# Patient Record
Sex: Female | Born: 1995 | Race: Black or African American | Hispanic: No | Marital: Single | Smoking: Never smoker
Health system: Southern US, Community
[De-identification: ages and names within clinical notes are randomized; demographics above are authoritative.]

## PROBLEM LIST (undated history)

## (undated) DIAGNOSIS — T7840XA Allergy, unspecified, initial encounter: Secondary | ICD-10-CM

## (undated) DIAGNOSIS — K219 Gastro-esophageal reflux disease without esophagitis: Secondary | ICD-10-CM

## (undated) DIAGNOSIS — R002 Palpitations: Secondary | ICD-10-CM

## (undated) DIAGNOSIS — F419 Anxiety disorder, unspecified: Secondary | ICD-10-CM

## (undated) DIAGNOSIS — J020 Streptococcal pharyngitis: Secondary | ICD-10-CM

## (undated) DIAGNOSIS — D649 Anemia, unspecified: Secondary | ICD-10-CM

## (undated) HISTORY — DX: Allergy, unspecified, initial encounter: T78.40XA

## (undated) HISTORY — DX: Anemia, unspecified: D64.9

## (undated) HISTORY — PX: OTHER SURGICAL HISTORY: SHX169

---

## 2014-11-17 DIAGNOSIS — L508 Other urticaria: Secondary | ICD-10-CM | POA: Insufficient documentation

## 2015-04-12 ENCOUNTER — Emergency Department
Admit: 2015-04-12 | Disposition: A | Discharge status: Home / Self Care | Payer: Self-pay | Admitting: Emergency Medicine

## 2015-04-12 LAB — TROPONIN I: Troponin-I: <0.03 ng/mL

## 2015-04-12 LAB — BASIC METABOLIC PANEL
ANION GAP: 7 (ref 7–16)
BUN: 12 mg/dL
CO2: 27 mmol/L
CREATININE: 0.63 mg/dL
Calcium, Total: 9.4 mg/dL
Chloride: 103 mmol/L
EGFR (African American): >60
EGFR (Non-African Amer.): >60
Glucose: 107 mg/dL — ABNORMAL HIGH
Potassium: 3.5 mmol/L
Sodium: 137 mmol/L

## 2015-04-12 LAB — CBC
HCT: 28.3 % — ABNORMAL LOW (ref 35.0–47.0)
HGB: 8.4 g/dL — ABNORMAL LOW (ref 12.0–16.0)
MCH: 18.9 pg — ABNORMAL LOW (ref 26.0–34.0)
MCHC: 29.6 g/dL — AB (ref 32.0–36.0)
MCV: 64 fL — AB (ref 80–100)
Platelet: 248 10*3/uL (ref 150–440)
RBC: 4.44 10*6/uL (ref 3.80–5.20)
RDW: 20.8 % — ABNORMAL HIGH (ref 11.5–14.5)
WBC: 5.8 10*3/uL (ref 3.6–11.0)

## 2015-04-12 LAB — MAGNESIUM: MAGNESIUM: 1.9 mg/dL

## 2015-06-26 ENCOUNTER — Encounter: Payer: Self-pay | Admitting: *Deleted

## 2015-06-26 ENCOUNTER — Emergency Department: Payer: Federal, State, Local not specified - PPO

## 2015-06-26 ENCOUNTER — Observation Stay
Admission: EM | Admit: 2015-06-26 | Discharge: 2015-06-27 | Disposition: A | Discharge status: Home / Self Care | Payer: Federal, State, Local not specified - PPO | Attending: Specialist | Admitting: Specialist

## 2015-06-26 ENCOUNTER — Observation Stay
Admit: 2015-06-26 | Discharge: 2015-06-26 | Disposition: A | Discharge status: Home / Self Care | Payer: Federal, State, Local not specified - PPO | Attending: Specialist | Admitting: Specialist

## 2015-06-26 DIAGNOSIS — Z833 Family history of diabetes mellitus: Secondary | ICD-10-CM | POA: Insufficient documentation

## 2015-06-26 DIAGNOSIS — Z8249 Family history of ischemic heart disease and other diseases of the circulatory system: Secondary | ICD-10-CM | POA: Diagnosis not present

## 2015-06-26 DIAGNOSIS — R002 Palpitations: Secondary | ICD-10-CM | POA: Diagnosis present

## 2015-06-26 DIAGNOSIS — Z8489 Family history of other specified conditions: Secondary | ICD-10-CM | POA: Insufficient documentation

## 2015-06-26 DIAGNOSIS — D509 Iron deficiency anemia, unspecified: Secondary | ICD-10-CM | POA: Diagnosis not present

## 2015-06-26 DIAGNOSIS — R000 Tachycardia, unspecified: Principal | ICD-10-CM | POA: Insufficient documentation

## 2015-06-26 DIAGNOSIS — D72829 Elevated white blood cell count, unspecified: Secondary | ICD-10-CM | POA: Insufficient documentation

## 2015-06-26 DIAGNOSIS — J020 Streptococcal pharyngitis: Secondary | ICD-10-CM | POA: Insufficient documentation

## 2015-06-26 HISTORY — DX: Palpitations: R00.2

## 2015-06-26 HISTORY — DX: Streptococcal pharyngitis: J02.0

## 2015-06-26 HISTORY — DX: Gastro-esophageal reflux disease without esophagitis: K21.9

## 2015-06-26 LAB — COMPREHENSIVE METABOLIC PANEL
ALBUMIN: 4.2 g/dL (ref 3.5–5.0)
ALT: 12 U/L — ABNORMAL LOW (ref 14–54)
AST: 25 U/L (ref 15–41)
Alkaline Phosphatase: 55 U/L (ref 38–126)
Anion gap: 9 (ref 5–15)
BILIRUBIN TOTAL: 0.3 mg/dL (ref 0.3–1.2)
BUN: 10 mg/dL (ref 6–20)
CHLORIDE: 102 mmol/L (ref 101–111)
CO2: 23 mmol/L (ref 22–32)
Calcium: 9.3 mg/dL (ref 8.9–10.3)
Creatinine, Ser: 0.81 mg/dL (ref 0.44–1.00)
Glucose, Bld: 124 mg/dL — ABNORMAL HIGH (ref 65–99)
Potassium: 3.3 mmol/L — ABNORMAL LOW (ref 3.5–5.1)
Sodium: 134 mmol/L — ABNORMAL LOW (ref 135–145)
TOTAL PROTEIN: 8.4 g/dL — AB (ref 6.5–8.1)

## 2015-06-26 LAB — RETICULOCYTES
RBC.: 4.63 MIL/uL (ref 3.80–5.20)
RETIC COUNT ABSOLUTE: 41.7 10*3/uL (ref 19.0–183.0)
Retic Ct Pct: 0.9 % (ref 0.4–3.1)

## 2015-06-26 LAB — CBC WITH DIFFERENTIAL/PLATELET
Basophils Absolute: 0.1 10*3/uL (ref 0–0.1)
EOS ABS: 0.2 10*3/uL (ref 0–0.7)
HCT: 28.3 % — ABNORMAL LOW (ref 35.0–47.0)
HEMOGLOBIN: 8.6 g/dL — AB (ref 12.0–16.0)
LYMPHS ABS: 1 10*3/uL (ref 1.0–3.6)
Lymphocytes Relative: 5 %
MCH: 18.7 pg — ABNORMAL LOW (ref 26.0–34.0)
MCHC: 30.3 g/dL — ABNORMAL LOW (ref 32.0–36.0)
MCV: 61.8 fL — AB (ref 80.0–100.0)
MONO ABS: 1.4 10*3/uL — AB (ref 0.2–0.9)
Neutro Abs: 15.7 10*3/uL — ABNORMAL HIGH (ref 1.4–6.5)
Platelets: 160 10*3/uL (ref 150–440)
RBC: 4.59 MIL/uL (ref 3.80–5.20)
RDW: 20.7 % — AB (ref 11.5–14.5)
WBC: 18.4 10*3/uL — ABNORMAL HIGH (ref 3.6–11.0)

## 2015-06-26 LAB — FOLATE: FOLATE: 17.2 ng/mL (ref >5.9)

## 2015-06-26 LAB — IRON AND TIBC
IRON: 23 ug/dL — AB (ref 28–170)
SATURATION RATIOS: 5 % — AB (ref 10.4–31.8)
TIBC: 442 ug/dL (ref 250–450)
UIBC: 419 ug/dL

## 2015-06-26 LAB — FERRITIN: FERRITIN: 11 ng/mL (ref 11–307)

## 2015-06-26 LAB — VITAMIN B12: Vitamin B-12: 674 pg/mL (ref 180–914)

## 2015-06-26 LAB — HCG, QUANTITATIVE, PREGNANCY

## 2015-06-26 LAB — T4, FREE: FREE T4: 0.67 ng/dL (ref 0.61–1.12)

## 2015-06-26 LAB — TSH: TSH: 2.887 u[IU]/mL (ref 0.350–4.500)

## 2015-06-26 LAB — TROPONIN I: Troponin I: <0.03 ng/mL (ref <0.031)

## 2015-06-26 IMAGING — CR DG CHEST 1V PORT
1 series · 1 of 1 positions shown · non-contrast
Comparison: None.

CLINICAL DATA: Chest palpitations since 6 a.m..

EXAM:
PORTABLE CHEST - 1 VIEW

[ap]
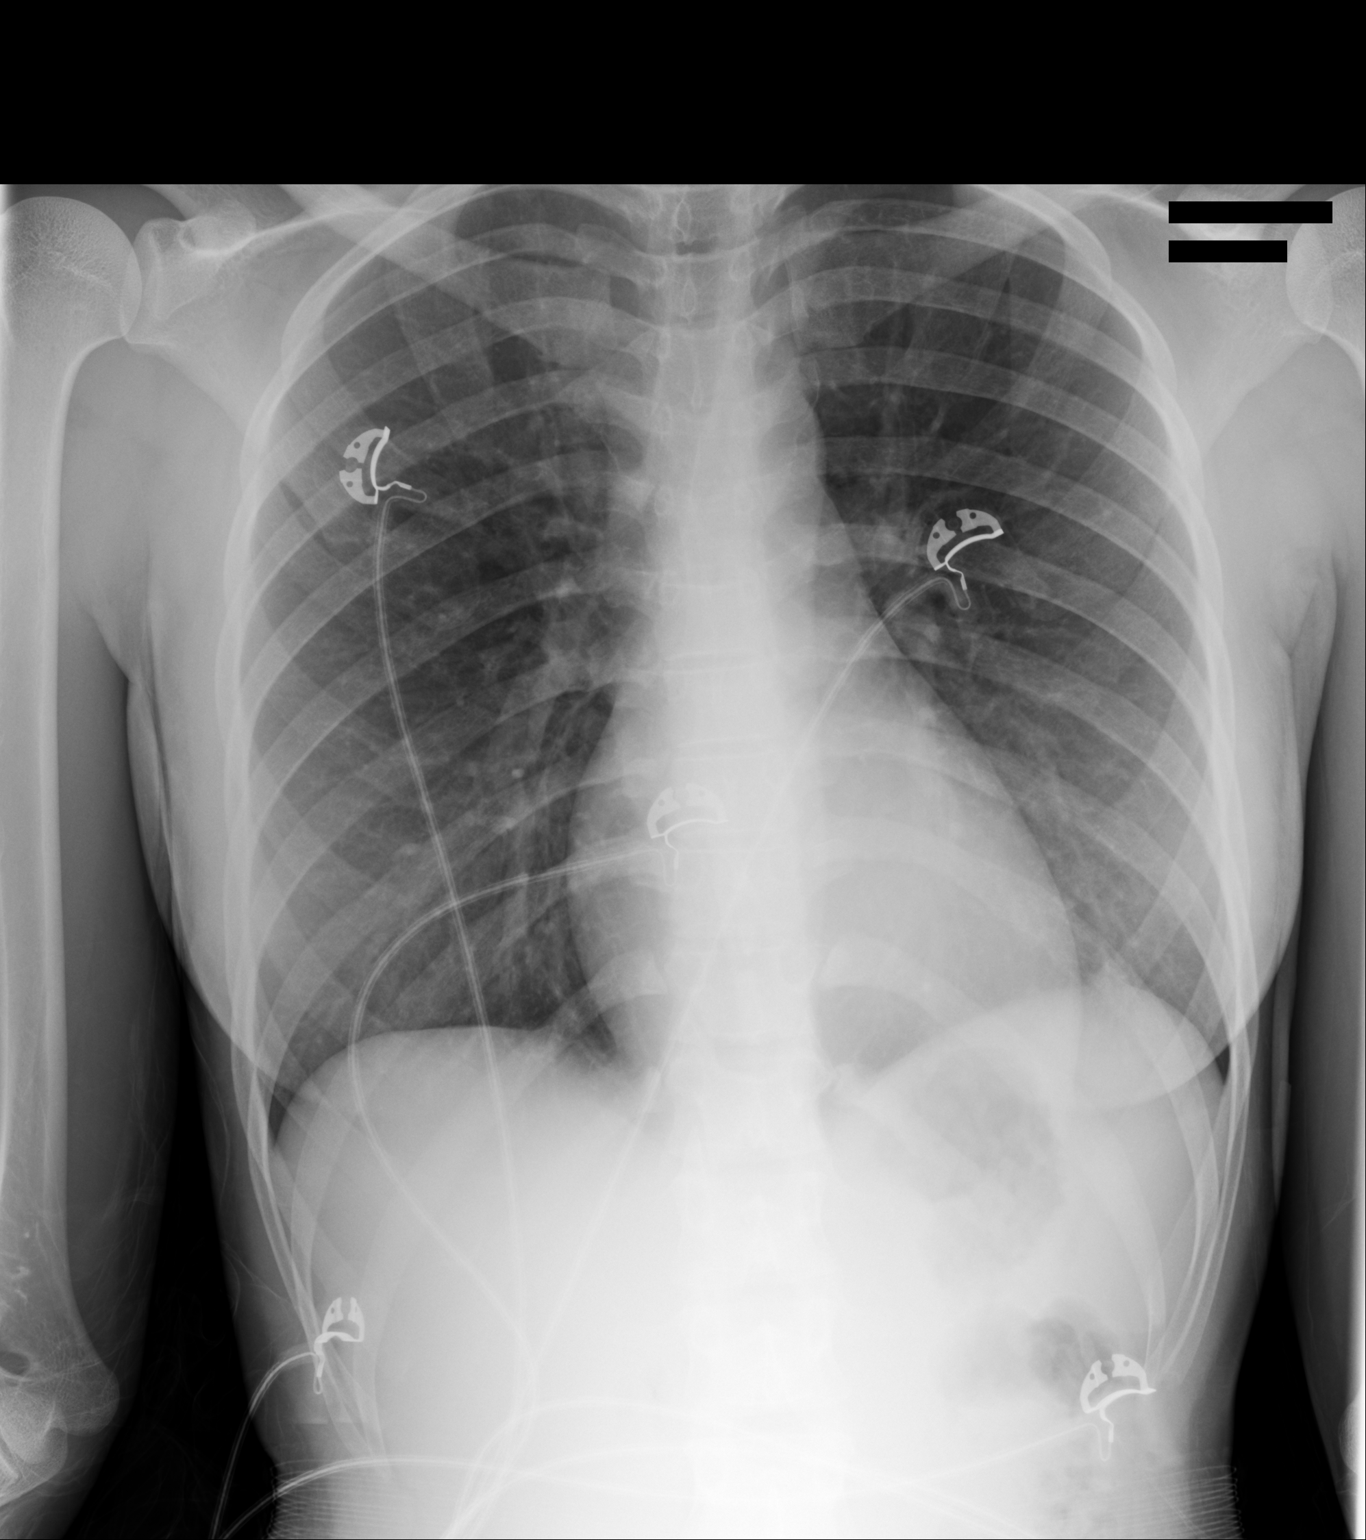

[1 of 1 positions shown; findings below may reference images not displayed]

FINDINGS: Minimal left base opacity, atelectasis versus early infiltrate.
Right lung is clear. Heart is normal size. No effusions or acute
bony abnormality.
IMPRESSION: Minimal left base atelectasis or early infiltrate.

## 2015-06-26 MED ORDER — SODIUM CHLORIDE 0.9 % IV SOLN
INTRAVENOUS | Status: DC
  Administered 2015-06-26 – 2015-06-27 (×3): via INTRAVENOUS

## 2015-06-26 MED ORDER — ACETAMINOPHEN 325 MG PO TABS: 650.0000 mg | ORAL_TABLET | Freq: Four times a day (QID) | ORAL | Status: DC | PRN

## 2015-06-26 MED ORDER — SODIUM CHLORIDE 0.9 % IV SOLN
1000.0000 mL | Freq: Once | INTRAVENOUS | Status: AC
  Administered 2015-06-26: 1000 mL via INTRAVENOUS

## 2015-06-26 MED ORDER — ACETAMINOPHEN 160 MG/5ML PO SOLN
650.0000 mg | Freq: Four times a day (QID) | ORAL | Status: DC | PRN
  Administered 2015-06-26 – 2015-06-27 (×3): 650 mg via ORAL
  Filled 2015-06-26 (×3): qty 20.3

## 2015-06-26 MED ORDER — ONDANSETRON HCL 4 MG/2ML IJ SOLN
4.0000 mg | Freq: Four times a day (QID) | INTRAMUSCULAR | Status: DC | PRN
  Administered 2015-06-26: 4 mg via INTRAVENOUS
  Filled 2015-06-26 (×2): qty 2

## 2015-06-26 MED ORDER — ACETAMINOPHEN 650 MG RE SUPP: 650.0000 mg | Freq: Four times a day (QID) | RECTAL | Status: DC | PRN

## 2015-06-26 MED ORDER — ONDANSETRON HCL 4 MG PO TABS: 4.0000 mg | ORAL_TABLET | Freq: Four times a day (QID) | ORAL | Status: DC | PRN

## 2015-06-26 MED ORDER — SODIUM CHLORIDE 0.9 % IV BOLUS (SEPSIS)
500.0000 mL | Freq: Once | INTRAVENOUS | Status: AC
  Administered 2015-06-26: 500 mL via INTRAVENOUS

## 2015-06-26 MED ORDER — FERROUS SULFATE 220 (44 FE) MG/5ML PO ELIX
220.0000 mg | ORAL_SOLUTION | Freq: Two times a day (BID) | ORAL | Status: DC
  Administered 2015-06-26 – 2015-06-27 (×2): 220 mg via ORAL
  Filled 2015-06-26 (×4): qty 5

## 2015-06-26 MED ORDER — LEVOFLOXACIN IN D5W 750 MG/150ML IV SOLN
750.0000 mg | INTRAVENOUS | Status: DC
  Administered 2015-06-26: 750 mg via INTRAVENOUS
  Filled 2015-06-26 (×3): qty 150

## 2015-06-26 MED ORDER — PANTOPRAZOLE SODIUM 40 MG PO TBEC
40.0000 mg | DELAYED_RELEASE_TABLET | Freq: Every day | ORAL | Status: DC
  Administered 2015-06-26 – 2015-06-27 (×2): 40 mg via ORAL
  Filled 2015-06-26 (×2): qty 1

## 2015-06-26 MED ORDER — POTASSIUM CHLORIDE 20 MEQ PO PACK
40.0000 meq | PACK | Freq: Once | ORAL | Status: AC
  Administered 2015-06-26: 40 meq via ORAL
  Filled 2015-06-26: qty 2

## 2015-06-26 NOTE — Progress Notes (Signed)
Patient complaining of leg cramps. Potassium this morning was 3.3. Per Dr. Cherlynn KaiserSainani, go ahead and give 40 mEq of Potassium powder.  Adella NissenBailey, Brunella Wileman G

## 2015-06-26 NOTE — Progress Notes (Signed)
Patient admitted to unit. Oriented to room, call bell, and staff. Bed in lowest position. Fall safety plan reviewed. Full assessment to Epic. Will continue to monitor. Skin assessment with Vilinda BlanksBrandi Mansfield RN.  Dr. Cherlynn KaiserSainani notified that patient wants liquid tylenol instead of pills. - order to change to liquid with same dose and frequency as previous order.  Jennifer Byrd, Jennifer Byrd

## 2015-06-26 NOTE — Progress Notes (Signed)
Alert and oriented. Family at bedside. Complaints of headache and nausea improved by medications. Resting quietly.  Adella NissenBailey, Morry Veiga G

## 2015-06-26 NOTE — ED Provider Notes (Addendum)
[facility] Emergency Department Provider Note  ____________________________________________  Time seen: 7 AM  I have reviewed the triage vital signs and the nursing notes.   HISTORY  Chief Complaint Palpitations    HPI Jennifer Byrd is a 19 y.o. female who presents with heart racing. She reports yesterday afternoon she realized her heart was racing but it improved after work at approximately 1 AM and she felt well. She then woke up this morning and felt like her heart was racing so came to the emergency department. She has no pain. She recently finished a course of cefdinir antibiotics for sore throat. She denies increased caffeine use. She has no shortness of breath. No recent travel. No history of DVT or PE. She had similar symptoms 1-1/2 months ago but they improved by decreasing caffeine intake. She has no pleurisy. She reports the symptoms have been improving since she has been in the emergency department     Past Medical History  Diagnosis Date  . Acid reflux   . Strep throat   . Heart palpitations     Patient Active Problem List   Diagnosis Date Noted  . Palpitations 06/26/2015    History reviewed. No pertinent past surgical history.  Current Outpatient Rx  Name  Route  Sig  Dispense  Refill  . cefdinir (OMNICEF) 300 MG capsule   Oral   Take 300 mg by mouth 2 (two) times daily. Take for 10 days.         [name]. omeprazole (PRILOSEC) 40 MG capsule   Oral   Take 40 mg by mouth daily. Take 30 to 60 minutes prior to evening meal.           Allergies Amoxicillin and Amoxicillin-pot clavulanate  Family History  Problem Relation Age of Onset  . Diabetes Mother   . Hypothyroidism Mother   . Diabetes Father   . Hypertension Father     Social History History  Substance Use Topics  . Smoking status: Never Smoker   . Smokeless tobacco: Not on file  . Alcohol Use: No    Review of Systems  Constitutional: Negative for  fever. Eyes: Negative for visual changes. ENT: Negative for sore throat Cardiovascular: Negative for chest pain. Positive for palpitations Respiratory: Negative for shortness of breath. Gastrointestinal: Negative for abdominal pain, vomiting and diarrhea. Genitourinary: Negative for dysuria. Musculoskeletal: Negative for back pain. Skin: Negative for rash. Neurological: Negative for headaches or focal weakness Psychiatric: No anxiety  10-point ROS otherwise negative.  ____________________________________________   PHYSICAL EXAM:  VITAL SIGNS: ED Triage Vitals  Enc Vitals Group     BP 06/26/15 0626 126/69 mmHg     Pulse Rate 06/26/15 0626 156     Resp 06/26/15 0626 20     Temp 06/26/15 0626 98.2 F (36.8 C)     Temp Source 06/26/15 0626 Oral     SpO2 06/26/15 0626 100 %     Weight 06/26/15 0626 122 lb (55.339 kg)     Height 06/26/15 0626 4\' 8"  (1.422 m)     Head Cir --      Peak Flow --      Pain Score --      Pain Loc --      Pain Edu? --      Excl. in GC? --      Constitutional: Alert and oriented. Well appearing and in no distress. Eyes: Conjunctivae are normal.  ENT   Head: Normocephalic and atraumatic.  Mouth/Throat: Mucous membranes are moist. Cardiovascular: Tachycardia, regular rhythm. Normal and symmetric distal pulses are present in all extremities. No murmurs, rubs, or gallops. Respiratory: Normal respiratory effort without tachypnea nor retractions. Breath sounds are clear and equal bilaterally.  Gastrointestinal: Soft and non-tender in all quadrants. No distention. There is no CVA tenderness. Genitourinary: deferred Musculoskeletal: Nontender with normal range of motion in all extremities. No lower extremity tenderness nor edema. Neurologic:  Normal speech and language. No gross focal neurologic deficits are appreciated. Skin:  Skin is warm, dry and intact. No rash noted. Psychiatric: Mood and affect are normal. Patient exhibits appropriate  insight and judgment.  ____________________________________________    LABS (pertinent positives/negatives)  Labs Reviewed  CBC WITH DIFFERENTIAL/PLATELET - Abnormal; Notable for the following:    WBC 18.4 (*)    Hemoglobin 8.6 (*)    HCT 28.3 (*)    MCV 61.8 (*)    MCH 18.7 (*)    MCHC 30.3 (*)    RDW 20.7 (*)    Neutro Abs 15.7 (*)    Monocytes Absolute 1.4 (*)    All other components within normal limits  COMPREHENSIVE METABOLIC PANEL - Abnormal; Notable for the following:    Sodium 134 (*)    Potassium 3.3 (*)    Glucose, Bld 124 (*)    Total Protein 8.4 (*)    ALT 12 (*)    All other components within normal limits  TSH  T4, FREE  HCG, QUANTITATIVE, PREGNANCY  TROPONIN I    ____________________________________________   EKG  ED ECG REPORT I, Jene Every, the attending physician, personally viewed and interpreted this ECG.   Date: 06/26/2015  EKG Time: 6:39 AM  Rate: 148  Rhythm: sinus tachycardia  Axis: Normal  Intervals:none  ST&T Change: Nonspecific changes   ____________________________________________    RADIOLOGY I have personally reviewed any xrays that were ordered on this patient: Chest x-ray with area of atelectasis versus infiltrate  ____________________________________________   PROCEDURES  Procedure(s) performed: none  Critical Care performed: none  ____________________________________________   INITIAL IMPRESSION / ASSESSMENT AND PLAN / ED COURSE  Pertinent labs & imaging results that were available during my care of the patient were reviewed by me and considered in my medical decision making (see chart for details).  Patient's symptoms improving in emergency department with IV fluids and rest. Unclear cause of tachycardia we will check thyroid levels, CMP, CBC.  Even after fluids patient persistently tachycardic especially with movement I will consult internal medicine for consideration for  admission. ____________________________________________   FINAL CLINICAL IMPRESSION(S) / ED DIAGNOSES  Final diagnoses:  Heart palpitations     Jene Every, MD 06/26/15 1610  Jene Every, MD 06/26/15 [phone]

## 2015-06-26 NOTE — ED Notes (Signed)
Pt has episode of HR jumping up to 145, MD was in with patient

## 2015-06-26 NOTE — H&P (Signed)
[facility] - [place] [facility]   PATIENT NAME: Jennifer Byrd    MR#:  [id number]  DATE OF BIRTH:  [birth year]  DATE OF ADMISSION:  06/26/2015  PRIMARY CARE PHYSICIAN: No primary care provider on file.   REQUESTING/REFERRING PHYSICIAN: Dr. Jene Every  CHIEF COMPLAINT:   Chief Complaint  Patient presents with  . Palpitations    HISTORY OF PRESENT ILLNESS:  Jennifer Byrd  is a 19 y.o. female with a known history of recent acute pharyngitis, reflux, chronic iron deficient anemia who presented to the hospital due to palpitations, dizziness. Patient was in her usual state of health when yesterday evening she started to feel her heart was racing and when she started to get up she felt a bit dizzy and lightheaded. Patient was recently diagnosed with acute pharyngitis and started on oral Augmentin but could not tolerate it due to a rash and therefore switched over to oral Cefdinir.  She was seen by ENT by Dr. Geanie Logan had a laryngoscopy which showed some mild reflux recently. She now presents with the above complaints. Patient was hydrated with IV fluids and her heart rate did come down from the 150s to low 100's but when she attempted to cannulate her heart rate went back up to the 150s again. EKG is consistent with sinus tachycardia. Hospitalist services were contacted for further treatment and evaluation.  PAST MEDICAL HISTORY:   Past Medical History  Diagnosis Date  . Acid reflux   . Strep throat   . Heart palpitations     PAST SURGICAL HISTORY:  History reviewed. No pertinent past surgical history.  SOCIAL HISTORY:   History  Substance Use Topics  . Smoking status: Never Smoker   . Smokeless tobacco: Not on file  . Alcohol Use: No    FAMILY HISTORY:   Family History  Problem Relation Age of Onset  . Diabetes Mother   . Hypothyroidism Mother   . Diabetes Father   . Hypertension Father     DRUG ALLERGIES:   Allergies  Allergen  Reactions  . Amoxicillin Hives, Rash and Other (See Comments)    Patient states it makes her neck break out.  [name] Amoxicillin-Pot Clavulanate Rash    REVIEW OF SYSTEMS:   Review of Systems  Constitutional: Positive for chills. Negative for fever and weight loss.  HENT: Negative for congestion, nosebleeds and tinnitus.   Eyes: Negative for blurred vision, double vision and redness.  Respiratory: Negative for cough, hemoptysis and shortness of breath.   Cardiovascular: Positive for palpitations. Negative for chest pain, orthopnea, leg swelling and PND.  Gastrointestinal: Negative for nausea, vomiting, abdominal pain, diarrhea and melena.  Genitourinary: Negative for dysuria, urgency and hematuria.  Musculoskeletal: Negative for joint pain and falls.  Neurological: Positive for dizziness. Negative for tingling, sensory change, focal weakness, seizures, weakness and headaches.  Endo/Heme/Allergies: Negative for polydipsia. Does not bruise/bleed easily.  Psychiatric/Behavioral: Negative for depression and memory loss. The patient is not nervous/anxious.     MEDICATIONS AT HOME:   Prior to Admission medications   Medication Sig Start Date End Date Taking? Authorizing Provider  cefdinir (OMNICEF) 300 MG capsule Take 300 mg by mouth 2 (two) times daily. Take for 10 days.   Yes Historical Provider, MD  omeprazole (PRILOSEC) 40 MG capsule Take 40 mg by mouth daily. Take 30 to 60 minutes prior to evening meal.   Yes Historical Provider, MD      VITAL SIGNS:  Blood pressure 137/87, pulse  112, temperature 98.2 F (36.8 C), temperature source Oral, resp. rate 18, height 5\' 6"  (1.676 m), weight 55.339 kg (122 lb), last menstrual period 06/03/2015, SpO2 100 %.  PHYSICAL EXAMINATION:  Physical Exam  GENERAL:  19 y.o.-year-old patient lying in the bed with no acute distress.  EYES: Pupils equal, round, reactive to light and accommodation. No scleral icterus. Extraocular muscles intact.  HEENT:  Head atraumatic, normocephalic. Oropharynx and nasopharynx clear. No oropharyngeal erythema, moist oral mucosa  NECK:  Supple, no jugular venous distention. No thyroid enlargement, no tenderness.  LUNGS: Normal breath sounds bilaterally, no wheezing, rales, rhonchi. No use of accessory muscles of respiration.  CARDIOVASCULAR: S1, S2 RRR. Tachycardic. No murmurs, rubs, gallops, clicks.  ABDOMEN: Soft, nontender, nondistended. Bowel sounds present. No organomegaly or mass.  EXTREMITIES: No pedal edema, cyanosis, or clubbing. + 2 pedal & radial pulses b/l.   NEUROLOGIC: Cranial nerves II through XII are intact. No focal Motor or sensory deficits appreciated b/l PSYCHIATRIC: The patient is alert and oriented x 3. Good affect.  SKIN: No obvious rash, lesion, or ulcer.   LABORATORY PANEL:   CBC  Recent Labs Lab 06/26/15 0642  WBC 18.4*  HGB 8.6*  HCT 28.3*  PLT 160   ------------------------------------------------------------------------------------------------------------------  Chemistries   Recent Labs Lab 06/26/15 0642  NA 134*  K 3.3*  CL 102  CO2 23  GLUCOSE 124*  BUN 10  CREATININE 0.81  CALCIUM 9.3  AST 25  ALT 12*  ALKPHOS 55  BILITOT 0.3   ------------------------------------------------------------------------------------------------------------------  Cardiac Enzymes  Recent Labs Lab 06/26/15 0642  TROPONINI <0.03   ------------------------------------------------------------------------------------------------------------------  RADIOLOGY:  Dg Chest Portable 1 View  06/26/2015   CLINICAL DATA:  Chest palpitations since 6 a.m.  EXAM: PORTABLE CHEST - 1 VIEW  COMPARISON:  None.  FINDINGS: Minimal left base opacity, atelectasis versus early infiltrate. Right lung is clear. Heart is normal size. No effusions or acute bony abnormality.  IMPRESSION: Minimal left base atelectasis or early infiltrate.   Electronically Signed   By: Charlett Nose M.D.   On:  06/26/2015 07:21     IMPRESSION AND PLAN:   19 year old female with past medical history of GERD, chronic iron deficient anemia, recent history of acute pharyngitis who presents to the hospital with tachycardia and palpitations also dizziness.  #1 SIRS - patient presents with a heart rate greater than 120 and also leukocytosis. -The exact source is presently unclear. Patient recently had acute pharyngitis and exam presently she has no oral exudates or any lymphadenopathy. Chest x-ray is negative for any acute pneumonia and urinalysis is negative. -We'll empirically start her on IV Levaquin. Follow fever curve and hemodynamics. I will also aggressively hydrate her with IV fluids.  #2 sinus tachycardia-possibly related to the underlying infection. Questionable underlying primary Cardiologic problem which is unlikely though. -We'll observe on telemetry, follow heart rate. Get a two-dimensional echocardiogram. -If with IV fluid hydration overnight patient's heart rate does not improve we'll consider getting cardiology evaluation a.m.  #3 anemia-microcytic. This is likely secondary to her iron deficient anemia. Patient is noncompliant with iron supplements. -We will check iron, ferritin, B12, folate, reticulocyte count. -We'll restart her iron supplements. Patient likely needs to be referred to hematology oncology as an outpatient.  -Patient's hemoglobin is unchanged from April to now.  #4 GERD-continue Protonix.   All the records are reviewed and case discussed with ED provider. Management plans discussed with the patient, family and they are in agreement.  CODE STATUS - FULL  TOTAL TIME TAKING CARE OF THIS PATIENT - 45 minutes.    [place] SirenSAINANI,Zi Newbury J M.D on 06/26/2015 at 9:07 AM  Between 7am to 6pm - Pager - [phone]  After 6pm go to [email or web address] - password [email or web address] [facility]  [facility]  Office  [phone]  CC: Primary care physician; No primary care provider on  file.

## 2015-06-26 NOTE — ED Notes (Signed)
Pt presents w/ tachycardia. Pt states recent hypertension. Pt denies chest pain and shortness of breath. Pt states the palpitations have been occurring intermittently since yesterday. Pt drove self to ED.

## 2015-06-26 NOTE — Progress Notes (Signed)
*  PRELIMINARY RESULTS* Echocardiogram 2D Echocardiogram has been performed.  Jennifer Byrd 06/26/2015, 11:01 AM

## 2015-06-27 LAB — CBC
HCT: 25.6 % — ABNORMAL LOW (ref 35.0–47.0)
Hemoglobin: 7.8 g/dL — ABNORMAL LOW (ref 12.0–16.0)
MCH: 19 pg — ABNORMAL LOW (ref 26.0–34.0)
MCHC: 30.6 g/dL — ABNORMAL LOW (ref 32.0–36.0)
MCV: 62.1 fL — ABNORMAL LOW (ref 80.0–100.0)
Platelets: 166 10*3/uL (ref 150–440)
RBC: 4.12 MIL/uL (ref 3.80–5.20)
RDW: 20.9 % — AB (ref 11.5–14.5)
WBC: 16 10*3/uL — AB (ref 3.6–11.0)

## 2015-06-27 LAB — URINALYSIS COMPLETE WITH MICROSCOPIC (ARMC ONLY)
BACTERIA UA: NONE SEEN
Bilirubin Urine: NEGATIVE
Glucose, UA: NEGATIVE mg/dL
HGB URINE DIPSTICK: NEGATIVE
Ketones, ur: NEGATIVE mg/dL
Leukocytes, UA: NEGATIVE
NITRITE: NEGATIVE
Protein, ur: 100 mg/dL — AB
SPECIFIC GRAVITY, URINE: 1.012 (ref 1.005–1.030)
pH: 6 (ref 5.0–8.0)

## 2015-06-27 LAB — BASIC METABOLIC PANEL
ANION GAP: 6 (ref 5–15)
BUN: <5 mg/dL — ABNORMAL LOW (ref 6–20)
CALCIUM: 8.6 mg/dL — AB (ref 8.9–10.3)
CO2: 22 mmol/L (ref 22–32)
CREATININE: 0.63 mg/dL (ref 0.44–1.00)
Chloride: 106 mmol/L (ref 101–111)
GFR calc Af Amer: >60 mL/min (ref >60)
GLUCOSE: 107 mg/dL — AB (ref 65–99)
Potassium: 3.7 mmol/L (ref 3.5–5.1)
SODIUM: 134 mmol/L — AB (ref 135–145)

## 2015-06-27 MED ORDER — FERROUS SULFATE 220 (44 FE) MG/5ML PO ELIX: 220.0000 mg | ORAL_SOLUTION | Freq: Two times a day (BID) | ORAL | Status: DC

## 2015-06-27 MED ORDER — FERUMOXYTOL INJECTION 510 MG/17 ML
510.0000 mg | Freq: Once | INTRAVENOUS | Status: AC
  Administered 2015-06-27: 510 mg via INTRAVENOUS
  Filled 2015-06-27: qty 17

## 2015-06-27 NOTE — Discharge Instructions (Signed)
°  DIET:  °Regular diet ° °DISCHARGE CONDITION:  °Good ° °ACTIVITY:  °Activity as tolerated ° °OXYGEN:  °Home Oxygen: No. °  °Oxygen Delivery: room air ° °DISCHARGE LOCATION:  °home  ° °If you experience worsening of your admission symptoms, develop shortness of breath, life threatening emergency, suicidal or homicidal thoughts you must seek medical attention immediately by calling 911 or calling your MD immediately  if symptoms less severe. ° °You Must read complete instructions/literature along with all the possible adverse reactions/side effects for all the Medicines you take and that have been prescribed to you. Take any new Medicines after you have completely understood and accpet all the possible adverse reactions/side effects.  ° °Please note ° °You were cared for by a hospitalist during your hospital stay. If you have any questions about your discharge medications or the care you received while you were in the hospital after you are discharged, you can call the unit and asked to speak with the hospitalist on call if the hospitalist that took care of you is not available. Once you are discharged, your primary care physician will handle any further medical issues. Please note that NO REFILLS for any discharge medications will be authorized once you are discharged, as it is imperative that you return to your primary care physician (or establish a relationship with a primary care physician if you do not have one) for your aftercare needs so that they can reassess your need for medications and monitor your lab values. ° ° °

## 2015-06-27 NOTE — Discharge Summary (Signed)
[facility] [place] at [facility]   PATIENT NAME: Jennifer Byrd    MR#:  [id number]  DATE OF BIRTH:  07/18/1996  DATE OF ADMISSION:  06/26/2015 ADMITTING PHYSICIAN: Houston Siren, MD  DATE OF DISCHARGE: 06/27/2015  PRIMARY CARE PHYSICIAN: No primary care provider on file.    ADMISSION DIAGNOSIS:  Heart palpitations [R00.2]  DISCHARGE DIAGNOSIS:  Active Problems:   Palpitations   SECONDARY DIAGNOSIS:   Past Medical History  Diagnosis Date  . Acid reflux   . Strep throat   . Heart palpitations     HOSPITAL COURSE:   19 year old female with past medical history of GERD, recent pharyngitis who presented to the hospital with palpitations and noted to be severely tachycardic and noted to have a leukocytosis.  #1 SIRS - patient presented with a heart rate greater than 120 and also leukocytosis. -The exact source is still unclear. Patient had a chest x-ray negative for any acute pneumonia. She had a recent bout of pharyngitis but on exam she had no tonsillar exudates or any lymphadenopathy. -She is clinically afebrile and hemodynamically stable and her heart rate has improved on just IV fluid hydration and therefore she is being discharged home. -She was given IV Levaquin in the hospital but she can resume her Cefdinir that she was prescribed by the ENT physician upon discharge.  #2 sinus tachycardia-this was likely related to possible sirs. Patient has been hydrated with IV fluids and her heart rate has improved. -Patient did have a two-dimensional echocardiogram checked which was normal with no acute wall motion abnormalities and no evidence of structural heart disease.  #3 anemia-microcytic. This is chronic for the patient and likely iron deficient in nature. -Patient was given 1 dose of Feraheme in the hospital. She has been advised to resume her iron supplements upon discharge. -Patient is going to follow-up at the cancer center as an outpatient for  her chronic iron deficient anemia. -The likely cause of the iron deficiency is probably dietary in nature as there was no evidence of acute bleeding.  #4 GERD-patient will continue omeprazole.  DISCHARGE CONDITIONS:   Stable  CONSULTS OBTAINED:     DRUG ALLERGIES:   Allergies  Allergen Reactions  . Amoxicillin Hives, Rash and Other (See Comments)    Patient states it makes her neck break out.  [name] Amoxicillin-Pot Clavulanate Rash    DISCHARGE MEDICATIONS:   Current Discharge Medication List    START taking these medications   Details  ferrous sulfate 220 (44 FE) MG/5ML solution Take 5 mLs (220 mg total) by mouth 2 (two) times daily with a meal. Qty: 150 mL, Refills: 3      CONTINUE these medications which have NOT CHANGED   Details  cefdinir (OMNICEF) 300 MG capsule Take 300 mg by mouth 2 (two) times daily. Take for 10 days.    omeprazole (PRILOSEC) 40 MG capsule Take 40 mg by mouth daily. Take 30 to 60 minutes prior to evening meal.         DISCHARGE INSTRUCTIONS:   DIET:  Regular diet  DISCHARGE CONDITION:  Stable  ACTIVITY:  Activity as tolerated  OXYGEN:  Home Oxygen: No.   Oxygen Delivery: room air  DISCHARGE LOCATION:  home   If you experience worsening of your admission symptoms, develop shortness of breath, life threatening emergency, suicidal or homicidal thoughts you must seek medical attention immediately by calling 911 or calling your MD immediately  if symptoms less severe.  You Must  read complete instructions/literature along with all the possible adverse reactions/side effects for all the Medicines you take and that have been prescribed to you. Take any new Medicines after you have completely understood and accpet all the possible adverse reactions/side effects.   Please note  You were cared for by a hospitalist during your hospital stay. If you have any questions about your discharge medications or the care you received while you were in  the hospital after you are discharged, you can call the unit and asked to speak with the hospitalist on call if the hospitalist that took care of you is not available. Once you are discharged, your primary care physician will handle any further medical issues. Please note that NO REFILLS for any discharge medications will be authorized once you are discharged, as it is imperative that you return to your primary care physician (or establish a relationship with a primary care physician if you do not have one) for your aftercare needs so that they can reassess your need for medications and monitor your lab values.     Today   Patient feels better, has no palpitations.  VITAL SIGNS:  Blood pressure 115/64, pulse 102, temperature 99 F (37.2 C), temperature source Oral, resp. rate 20, height 5\' 6"  (1.676 m), weight 55.339 kg (122 lb), last menstrual period 06/03/2015, SpO2 100 %.  I/O:   Intake/Output Summary (Last 24 hours) at 06/27/15 1201 Last data filed at 06/27/15 0953  Gross per 24 hour  Intake 1449.17 ml  Output      0 ml  Net 1449.17 ml    PHYSICAL EXAMINATION:  GENERAL:  19 y.o.-year-old patient lying in the bed with no acute distress.  EYES: Pupils equal, round, reactive to light and accommodation. No scleral icterus. Extraocular muscles intact.  HEENT: Head atraumatic, normocephalic. Oropharynx and nasopharynx clear. No oral exudates  NECK:  Supple, no jugular venous distention. No thyroid enlargement, no tenderness.  LUNGS: Normal breath sounds bilaterally, no wheezing, rales,rhonchi. No use of accessory muscles of respiration.  CARDIOVASCULAR: S1, S2 normal. No murmurs, rubs, or gallops.  ABDOMEN: Soft, non-tender, non-distended. Bowel sounds present. No organomegaly or mass.  EXTREMITIES: No pedal edema, cyanosis, or clubbing.  NEUROLOGIC: Cranial nerves II through XII are intact. No focal motor or sensory defecits b/l.  PSYCHIATRIC: The patient is alert and oriented x 3.  Good affect.  SKIN: No obvious rash, lesion, or ulcer.   DATA REVIEW:   CBC  Recent Labs Lab 06/27/15 0511  WBC 16.0*  HGB 7.8*  HCT 25.6*  PLT 166    Chemistries   Recent Labs Lab 06/26/15 0642 06/27/15 0511  NA 134* 134*  K 3.3* 3.7  CL 102 106  CO2 23 22  GLUCOSE 124* 107*  BUN 10 <5*  CREATININE 0.81 0.63  CALCIUM 9.3 8.6*  AST 25  --   ALT 12*  --   ALKPHOS 55  --   BILITOT 0.3  --     Cardiac Enzymes  Recent Labs Lab 06/26/15 0642  TROPONINI <0.03    Microbiology Results  No results found for this or any previous visit.  RADIOLOGY:  Dg Chest Portable 1 View  06/26/2015   CLINICAL DATA:  Chest palpitations since 6 a.m.  EXAM: PORTABLE CHEST - 1 VIEW  COMPARISON:  None.  FINDINGS: Minimal left base opacity, atelectasis versus early infiltrate. Right lung is clear. Heart is normal size. No effusions or acute bony abnormality.  IMPRESSION: Minimal left base atelectasis or  early infiltrate.   Electronically Signed   By: Charlett NoseKevin  Dover M.D.   On: 06/26/2015 07:21      Management plans discussed with the patient, family and they are in agreement.  CODE STATUS:     Code Status Orders        Start     Ordered   06/26/15 1004  Full code   Continuous     06/26/15 1003      TOTAL TIME TAKING CARE OF THIS PATIENT: 40 minutes.    Houston SirenSAINANI,Claudell Rhody J M.D on 06/27/2015 at 12:01 PM  Between 7am to 6pm - Pager - [phone]  After 6pm go to [email or web address] - password [email or web address] [facility]  [facility]  Office  [phone]  CC: Primary care physician; No primary care provider on file.

## 2015-06-27 NOTE — Progress Notes (Signed)
[facility]    Jennifer Byrd was admitted to the Hospital on 06/26/2015 and Discharged  06/27/2015 and should be excused from work  for 4 days starting 06/27/2015, may return to work/school without any restrictions.  Call [name] MD, [facility]  [phone] with questions.  Houston SirenSAINANI,VIVEK J M.D on 06/27/2015,at 11:26 AM

## 2015-07-01 ENCOUNTER — Other Ambulatory Visit: Payer: Self-pay

## 2015-07-01 ENCOUNTER — Emergency Department
Admission: EM | Admit: 2015-07-01 | Discharge: 2015-07-01 | Disposition: A | Discharge status: Home / Self Care | Payer: Federal, State, Local not specified - PPO | Attending: Emergency Medicine | Admitting: Emergency Medicine

## 2015-07-01 DIAGNOSIS — Z79899 Other long term (current) drug therapy: Secondary | ICD-10-CM | POA: Diagnosis not present

## 2015-07-01 DIAGNOSIS — R002 Palpitations: Secondary | ICD-10-CM

## 2015-07-01 DIAGNOSIS — Z880 Allergy status to penicillin: Secondary | ICD-10-CM | POA: Diagnosis not present

## 2015-07-01 LAB — COMPREHENSIVE METABOLIC PANEL
ALBUMIN: 4.2 g/dL (ref 3.5–5.0)
ALT: 17 U/L (ref 14–54)
AST: 20 U/L (ref 15–41)
Alkaline Phosphatase: 69 U/L (ref 38–126)
Anion gap: 8 (ref 5–15)
BUN: 9 mg/dL (ref 6–20)
CHLORIDE: 103 mmol/L (ref 101–111)
CO2: 28 mmol/L (ref 22–32)
CREATININE: 0.66 mg/dL (ref 0.44–1.00)
Calcium: 9.4 mg/dL (ref 8.9–10.3)
GFR calc non Af Amer: >60 mL/min (ref >60)
Glucose, Bld: 98 mg/dL (ref 65–99)
Potassium: 3.6 mmol/L (ref 3.5–5.1)
Sodium: 139 mmol/L (ref 135–145)
TOTAL PROTEIN: 7.9 g/dL (ref 6.5–8.1)
Total Bilirubin: 0.2 mg/dL — ABNORMAL LOW (ref 0.3–1.2)

## 2015-07-01 LAB — CBC WITH DIFFERENTIAL/PLATELET
Basophils Absolute: 0.1 10*3/uL (ref 0–0.1)
Basophils Relative: 1 %
Eosinophils Absolute: 0.3 10*3/uL (ref 0–0.7)
Eosinophils Relative: 5 %
HEMATOCRIT: 29.9 % — AB (ref 35.0–47.0)
Hemoglobin: 9.2 g/dL — ABNORMAL LOW (ref 12.0–16.0)
Lymphocytes Relative: 20 %
Lymphs Abs: 1.5 10*3/uL (ref 1.0–3.6)
MCH: 19.4 pg — AB (ref 26.0–34.0)
MCHC: 30.8 g/dL — AB (ref 32.0–36.0)
MCV: 62.9 fL — ABNORMAL LOW (ref 80.0–100.0)
MONO ABS: 0.8 10*3/uL (ref 0.2–0.9)
Monocytes Relative: 10 %
Neutro Abs: 4.8 10*3/uL (ref 1.4–6.5)
Neutrophils Relative %: 64 %
Platelets: 253 10*3/uL (ref 150–440)
RBC: 4.76 MIL/uL (ref 3.80–5.20)
RDW: 22 % — ABNORMAL HIGH (ref 11.5–14.5)
WBC: 7.5 10*3/uL (ref 3.6–11.0)

## 2015-07-01 LAB — URINALYSIS COMPLETE WITH MICROSCOPIC (ARMC ONLY)
BILIRUBIN URINE: NEGATIVE
Bacteria, UA: NONE SEEN
GLUCOSE, UA: NEGATIVE mg/dL
Ketones, ur: NEGATIVE mg/dL
Leukocytes, UA: NEGATIVE
Nitrite: NEGATIVE
Protein, ur: 100 mg/dL — AB
Specific Gravity, Urine: 1.017 (ref 1.005–1.030)
pH: 7 (ref 5.0–8.0)

## 2015-07-01 NOTE — ED Notes (Addendum)
Pt currently on commode. Pt made aware to notify RN when completed. Pt requested the assistance of a female Charity fundraiserN.

## 2015-07-01 NOTE — ED Provider Notes (Signed)
[facility] Emergency Department Provider Note  ____________________________________________  Time seen: Approximately 6:43 PM  I have reviewed the triage vital signs and the nursing notes.   HISTORY  Chief Complaint Palpitations    HPI Story Jennifer Byrd is a 19 y.o. female patient recently discharged from the hospital after being treated for possible sepsis. Patient reports she is having some diarrhea. She she has 3-4 episodes of diarrhea during the day. This has started while she was in the hospital and discontinued. Patient also feels a little bit lightheaded when she stands up sometimes. She has continued episodes of palpitations. Especially in the morning when she wakes up. She reports when I examine her that she's having some palpitations at that time although not as fast her heart is usual. At the time patient was having regular rate and rhythm with a good pulse. And was not tachycardic. Patient reports she did not finish the Ceftin near antibiotics as she was supposed to because her throat felt normal. She has not been running a fever.   Past Medical History  Diagnosis Date  . Acid reflux   . Strep throat   . Heart palpitations     Patient Active Problem List   Diagnosis Date Noted  . Palpitations 06/26/2015    No past surgical history on file.  Current Outpatient Rx  Name  Route  Sig  Dispense  Refill  . cefdinir (OMNICEF) 300 MG capsule   Oral   Take 300 mg by mouth 2 (two) times daily. Take for 10 days.         . ferrous sulfate 220 (44 FE) MG/5ML solution   Oral   Take 5 mLs (220 mg total) by mouth 2 (two) times daily with a meal.   150 mL   3   . omeprazole (PRILOSEC) 40 MG capsule   Oral   Take 40 mg by mouth daily. Take 30 to 60 minutes prior to evening meal.           Allergies Amoxicillin and Amoxicillin-pot clavulanate  Family History  Problem Relation Age of Onset  . Diabetes Mother   . Hypothyroidism Mother   .  Diabetes Father   . Hypertension Father     Social History History  Substance Use Topics  . Smoking status: Never Smoker   . Smokeless tobacco: Not on file  . Alcohol Use: No    Review of Systems Constitutional: No fever/chills Eyes: No visual changes. ENT: No sore throat. Cardiovascular: Denies chest pain. Respiratory: Denies shortness of breath. Gastrointestinal: No abdominal pain.  No nausea, no vomiting.  Patient had constipation before she had the diarrhea but that resolved Genitourinary: Negative for dysuria. Musculoskeletal: Negative for back pain. Skin: Negative for rash. Neurological: Negative for headaches, focal weakness or numbness.  10-point ROS otherwise negative.  ____________________________________________   PHYSICAL EXAM:  VITAL SIGNS: ED Triage Vitals  Enc Vitals Group     BP 07/01/15 1437 119/83 mmHg     Pulse Rate 07/01/15 1437 100     Resp 07/01/15 1437 16     Temp 07/01/15 1437 98.6 F (37 C)     Temp Source 07/01/15 1437 Oral     SpO2 07/01/15 1437 100 %     Weight 07/01/15 1437 125 lb (56.7 kg)     Height 07/01/15 1437  (1.676 m)     Head Cir --      Peak Flow --      Pain Score  07/01/15 1759 0     Pain Loc --      Pain Edu? --      Excl. in GC? --     Constitutional: Alert and oriented. Well appearing and in no acute distress. Eyes: Conjunctivae are normal. PERRL. EOMI. Head: Atraumatic. Nose: No congestion/rhinnorhea. Mouth/Throat: Mucous membranes are moist.  Oropharynx non-erythematous. Neck: No stridor.  Cardiovascular: Normal rate, regular rhythm. Grossly normal heart sounds.  Good peripheral circulation. Respiratory: Normal respiratory effort.  No retractions. Lungs CTAB. Gastrointestinal: Soft and nontender. No distention. No abdominal bruits. No CVA tenderness. Musculoskeletal: No lower extremity tenderness nor edema.  No joint effusions. Neurologic:  Normal speech and language. No gross focal neurologic deficits are  appreciated. Speech is normal. No gait instability. Skin:  Skin is warm, dry and intact. No rash noted. Psychiatric: Mood and affect are normal. Speech and behavior are normal.  ____________________________________________   LABS (all labs ordered are listed, but only abnormal results are displayed)  Labs Reviewed  CBC WITH DIFFERENTIAL/PLATELET - Abnormal; Notable for the following:    Hemoglobin 9.2 (*)    HCT 29.9 (*)    MCV 62.9 (*)    MCH 19.4 (*)    MCHC 30.8 (*)    RDW 22.0 (*)    All other components within normal limits  COMPREHENSIVE METABOLIC PANEL - Abnormal; Notable for the following:    Total Bilirubin 0.2 (*)    All other components within normal limits  URINALYSIS COMPLETEWITH MICROSCOPIC ([facility] ONLY) - Abnormal; Notable for the following:    Color, Urine YELLOW (*)    APPearance CLEAR (*)    Hgb urine dipstick 2+ (*)    Protein, ur 100 (*)    Squamous Epithelial / LPF 0-5 (*)    All other components within normal limits  C DIFFICILE QUICK SCAN W PCR REFLEX ([facility] ONLY)  STOOL CULTURE  POC URINE PREG, ED   ____________________________________________  EKG EKG read and interpreted by me shows normal sinus rhythm at a rate of 99 there are no acute changes. She does have RSR prime pattern in V1 but the QRS duration is normal. ____________________________________________  RADIOLOGY   ____________________________________________   PROCEDURES Nurse did orthostatic vital signs. He reports that the patient is not orthostatic.____________________________________________   INITIAL IMPRESSION / ASSESSMENT AND PLAN / ED COURSE  Pertinent labs & imaging results that were available during my care of the patient were reviewed by me and considered in my medical decision making (see chart for details).  Stool for C. difficile and culture was ordered. Patient has not been able to make a stool specimen. We will let her go home with the culture bottles and try to  bring back a stool specimen. Discussed patient with Dr. Ihor AustinPfaff cardiology he will follow her up for her palpitations. She is to call on Monday morning to get worked into the schedule. ____________________________________________   FINAL CLINICAL IMPRESSION(S) / ED DIAGNOSES  Final diagnoses:  None      Arnaldo NatalPaul F Malinda, MD 07/01/15 [phone]

## 2015-07-01 NOTE — ED Notes (Signed)
Pt educated on proper collection technique and submission of stool sample to lab.

## 2015-07-01 NOTE — ED Notes (Signed)
Pt states that she was seen on Sunday and admitted, pt was placed on iron supplements due to low iron, pt states that she came to the er that day with palpitation, pt states that she cont to have palpitations but they aren't quite as bad. Pt also states that she cont to have diarrhea.

## 2015-07-06 ENCOUNTER — Inpatient Hospital Stay: Payer: Federal, State, Local not specified - PPO | Attending: Oncology | Admitting: Oncology

## 2015-07-06 ENCOUNTER — Inpatient Hospital Stay: Payer: Federal, State, Local not specified - PPO

## 2015-07-06 VITALS — BP 102/69 | HR 80 | Temp 98.0°F | Wt 116.8 lb

## 2015-07-06 DIAGNOSIS — Z79899 Other long term (current) drug therapy: Secondary | ICD-10-CM | POA: Insufficient documentation

## 2015-07-06 DIAGNOSIS — D562 Delta-beta thalassemia: Secondary | ICD-10-CM | POA: Insufficient documentation

## 2015-07-06 DIAGNOSIS — K219 Gastro-esophageal reflux disease without esophagitis: Secondary | ICD-10-CM | POA: Diagnosis not present

## 2015-07-06 DIAGNOSIS — D509 Iron deficiency anemia, unspecified: Secondary | ICD-10-CM | POA: Insufficient documentation

## 2015-07-06 DIAGNOSIS — R002 Palpitations: Secondary | ICD-10-CM | POA: Diagnosis not present

## 2015-07-06 LAB — VITAMIN B12: Vitamin B-12: 818 pg/mL (ref 180–914)

## 2015-07-06 LAB — CBC
HCT: 29.9 % — ABNORMAL LOW (ref 35.0–47.0)
HEMOGLOBIN: 9 g/dL — AB (ref 12.0–16.0)
MCH: 19.5 pg — ABNORMAL LOW (ref 26.0–34.0)
MCHC: 30.1 g/dL — ABNORMAL LOW (ref 32.0–36.0)
MCV: 64.8 fL — ABNORMAL LOW (ref 80.0–100.0)
Platelets: 267 10*3/uL (ref 150–440)
RBC: 4.62 MIL/uL (ref 3.80–5.20)
RDW: 21.6 % — ABNORMAL HIGH (ref 11.5–14.5)
WBC: 7.9 10*3/uL (ref 3.6–11.0)

## 2015-07-06 LAB — IRON AND TIBC
Iron: 45 ug/dL (ref 28–170)
Saturation Ratios: 14 % (ref 10.4–31.8)
TIBC: 319 ug/dL (ref 250–450)
UIBC: 274 ug/dL

## 2015-07-06 LAB — FOLATE: Folate: 13.7 ng/mL (ref >5.9)

## 2015-07-06 LAB — FERRITIN: Ferritin: 615 ng/mL — ABNORMAL HIGH (ref 11–307)

## 2015-07-06 LAB — LACTATE DEHYDROGENASE: LDH: 114 U/L (ref 98–192)

## 2015-07-08 LAB — HEMOGLOBINOPATHY EVALUATION
HGB A2 QUANT: 1.3 % (ref 0.7–3.1)
HGB A: 78.4 % — AB (ref 94.0–98.0)
HGB F QUANT: 20.3 % — AB (ref 0.0–2.0)
HGB S QUANTITAION: 0 %
Hgb C: 0 %

## 2015-07-08 LAB — STOOL CULTURE: SPECIAL REQUESTS: NORMAL

## 2015-07-12 ENCOUNTER — Inpatient Hospital Stay: Payer: Federal, State, Local not specified - PPO

## 2015-07-12 VITALS — BP 118/72 | HR 78 | Temp 98.0°F | Resp 18

## 2015-07-12 DIAGNOSIS — D509 Iron deficiency anemia, unspecified: Secondary | ICD-10-CM

## 2015-07-12 MED ORDER — SODIUM CHLORIDE 0.9 % IV SOLN
510.0000 mg | Freq: Once | INTRAVENOUS | Status: AC
  Administered 2015-07-12: 510 mg via INTRAVENOUS
  Filled 2015-07-12: qty 17

## 2015-07-12 MED ORDER — SODIUM CHLORIDE 0.9 % IV SOLN
Freq: Once | INTRAVENOUS | Status: AC
  Administered 2015-07-12: 15:00:00 via INTRAVENOUS
  Filled 2015-07-12: qty 1000

## 2015-07-17 ENCOUNTER — Inpatient Hospital Stay
Admission: EM | Admit: 2015-07-17 | Discharge: 2015-07-18 | DRG: 373 | Disposition: A | Discharge status: Home / Self Care | Payer: Federal, State, Local not specified - PPO | Attending: Internal Medicine | Admitting: Internal Medicine

## 2015-07-17 ENCOUNTER — Encounter: Payer: Self-pay | Admitting: Radiology

## 2015-07-17 ENCOUNTER — Emergency Department: Payer: Federal, State, Local not specified - PPO

## 2015-07-17 DIAGNOSIS — D638 Anemia in other chronic diseases classified elsewhere: Secondary | ICD-10-CM | POA: Diagnosis present

## 2015-07-17 DIAGNOSIS — K219 Gastro-esophageal reflux disease without esophagitis: Secondary | ICD-10-CM | POA: Diagnosis present

## 2015-07-17 DIAGNOSIS — A047 Enterocolitis due to Clostridium difficile: Principal | ICD-10-CM | POA: Diagnosis present

## 2015-07-17 DIAGNOSIS — Z833 Family history of diabetes mellitus: Secondary | ICD-10-CM

## 2015-07-17 DIAGNOSIS — E876 Hypokalemia: Secondary | ICD-10-CM

## 2015-07-17 DIAGNOSIS — Z880 Allergy status to penicillin: Secondary | ICD-10-CM

## 2015-07-17 DIAGNOSIS — R002 Palpitations: Secondary | ICD-10-CM | POA: Diagnosis present

## 2015-07-17 DIAGNOSIS — R197 Diarrhea, unspecified: Secondary | ICD-10-CM

## 2015-07-17 DIAGNOSIS — R1110 Vomiting, unspecified: Secondary | ICD-10-CM

## 2015-07-17 DIAGNOSIS — A0472 Enterocolitis due to Clostridium difficile, not specified as recurrent: Secondary | ICD-10-CM | POA: Diagnosis present

## 2015-07-17 DIAGNOSIS — E860 Dehydration: Secondary | ICD-10-CM | POA: Diagnosis present

## 2015-07-17 DIAGNOSIS — J029 Acute pharyngitis, unspecified: Secondary | ICD-10-CM | POA: Diagnosis present

## 2015-07-17 DIAGNOSIS — Z8619 Personal history of other infectious and parasitic diseases: Secondary | ICD-10-CM | POA: Diagnosis present

## 2015-07-17 DIAGNOSIS — Z79899 Other long term (current) drug therapy: Secondary | ICD-10-CM

## 2015-07-17 DIAGNOSIS — Z8249 Family history of ischemic heart disease and other diseases of the circulatory system: Secondary | ICD-10-CM

## 2015-07-17 LAB — COMPREHENSIVE METABOLIC PANEL
ALBUMIN: 4.6 g/dL (ref 3.5–5.0)
ALT: 19 U/L (ref 14–54)
AST: 25 U/L (ref 15–41)
Alkaline Phosphatase: 69 U/L (ref 38–126)
Anion gap: 10 (ref 5–15)
BUN: 7 mg/dL (ref 6–20)
CHLORIDE: 98 mmol/L — AB (ref 101–111)
CO2: 27 mmol/L (ref 22–32)
Calcium: 10 mg/dL (ref 8.9–10.3)
Creatinine, Ser: 0.82 mg/dL (ref 0.44–1.00)
GFR calc Af Amer: >60 mL/min (ref >60)
GFR calc non Af Amer: >60 mL/min (ref >60)
Glucose, Bld: 140 mg/dL — ABNORMAL HIGH (ref 65–99)
Potassium: 3.1 mmol/L — ABNORMAL LOW (ref 3.5–5.1)
Sodium: 135 mmol/L (ref 135–145)
Total Bilirubin: 0.4 mg/dL (ref 0.3–1.2)
Total Protein: 8.7 g/dL — ABNORMAL HIGH (ref 6.5–8.1)

## 2015-07-17 LAB — URINALYSIS COMPLETE WITH MICROSCOPIC (ARMC ONLY)
BILIRUBIN URINE: NEGATIVE
Bacteria, UA: NONE SEEN
GLUCOSE, UA: NEGATIVE mg/dL
HGB URINE DIPSTICK: NEGATIVE
Leukocytes, UA: NEGATIVE
NITRITE: NEGATIVE
PH: 5 (ref 5.0–8.0)
Protein, ur: 100 mg/dL — AB
RBC / HPF: NONE SEEN RBC/hpf (ref 0–5)
SPECIFIC GRAVITY, URINE: 1.024 (ref 1.005–1.030)

## 2015-07-17 LAB — C DIFFICILE QUICK SCREEN W PCR REFLEX
C DIFFICILE (CDIFF) TOXIN: NEGATIVE
C Diff antigen: POSITIVE — AB

## 2015-07-17 LAB — CBC
HCT: 36.7 % (ref 35.0–47.0)
Hemoglobin: 11.4 g/dL — ABNORMAL LOW (ref 12.0–16.0)
MCH: 21.1 pg — ABNORMAL LOW (ref 26.0–34.0)
MCHC: 31.1 g/dL — ABNORMAL LOW (ref 32.0–36.0)
MCV: 67.8 fL — ABNORMAL LOW (ref 80.0–100.0)
Platelets: 354 10*3/uL (ref 150–440)
RBC: 5.41 MIL/uL — ABNORMAL HIGH (ref 3.80–5.20)
RDW: 28.8 % — ABNORMAL HIGH (ref 11.5–14.5)
WBC: 19.4 10*3/uL — ABNORMAL HIGH (ref 3.6–11.0)

## 2015-07-17 LAB — LIPASE, BLOOD: LIPASE: 19 U/L — AB (ref 22–51)

## 2015-07-17 LAB — PREGNANCY, URINE: Preg Test, Ur: NEGATIVE

## 2015-07-17 IMAGING — CT CT ABD-PELV W/ CM
1 of 2 series · 16 of 32 positions shown, 20 images · IV contrast (omnipaque)
Comparison: None.

CLINICAL DATA: Abdomen pain, vomiting and leukocytosis.

EXAM:
CT ABDOMEN AND PELVIS WITH CONTRAST
TECHNIQUE: Multidetector CT imaging of the abdomen and pelvis was performed
using the standard protocol following bolus administration of
intravenous contrast.
CONTRAST:  75mL OMNIPAQUE IOHEXOL 300 MG/ML  SOLN

[Series 2: routine abd pel with · axial · 0.59mm/px · z∈[-470,-80]mm · 16 of 86 slices shown, 20 images]
[im 4/86  soft-tissue]
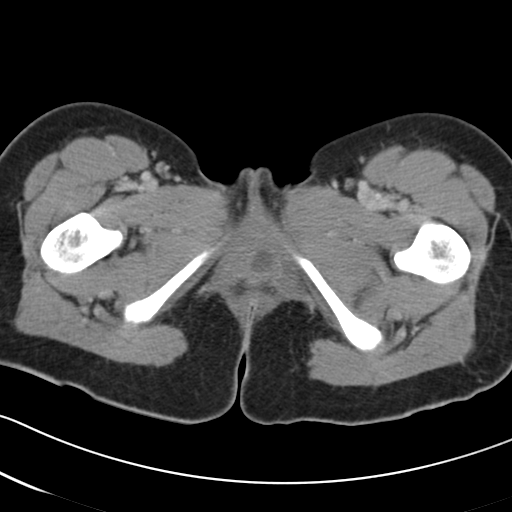
[im 4/86  bone]
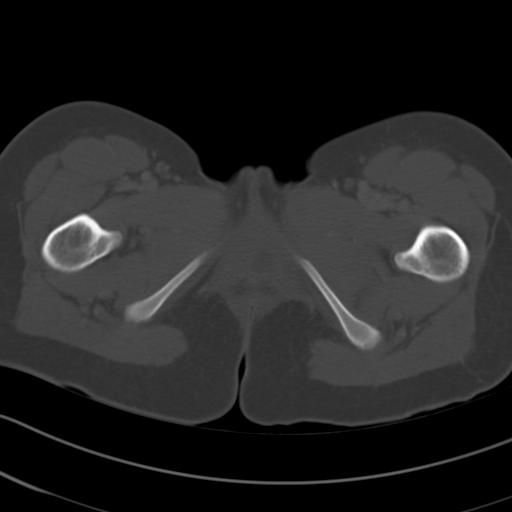
[im 10/86  soft-tissue]
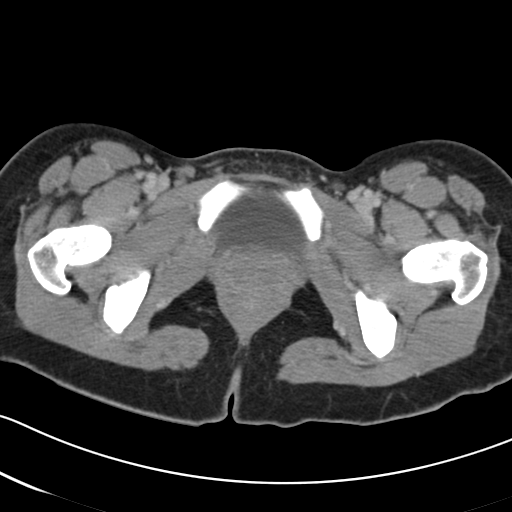
[im 17/86  soft-tissue]
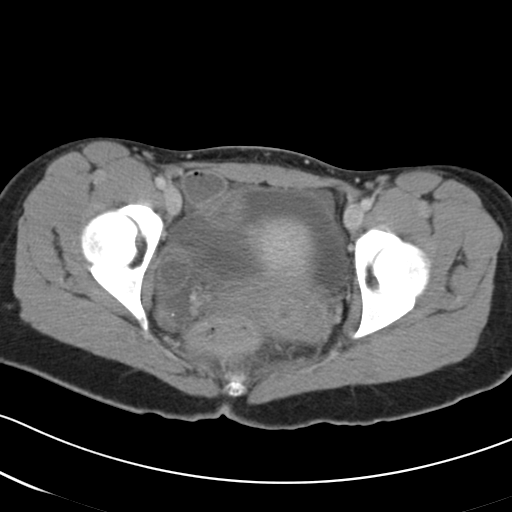
[im 23/86  soft-tissue]
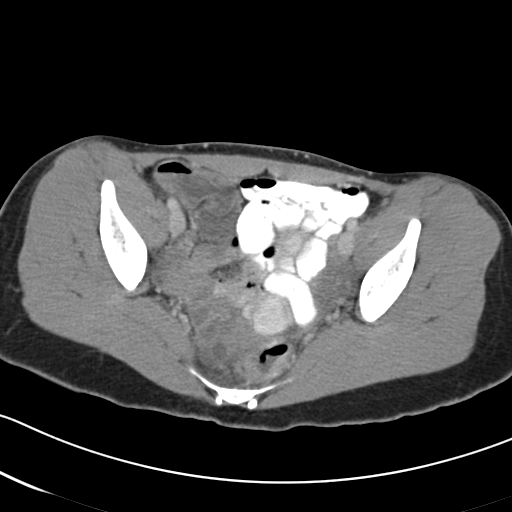
[im 30/86  soft-tissue]
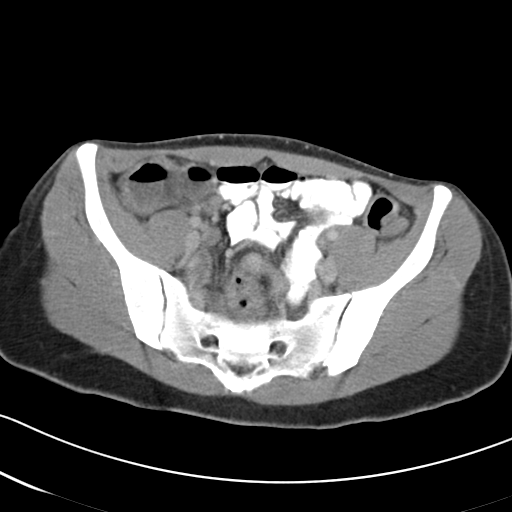
[im 33/86  soft-tissue]
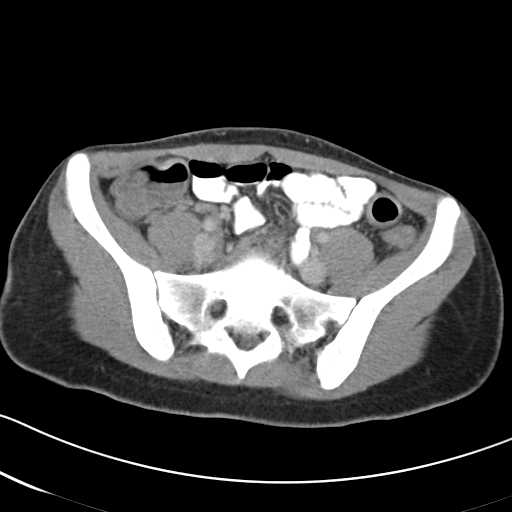
[im 40/86  soft-tissue]
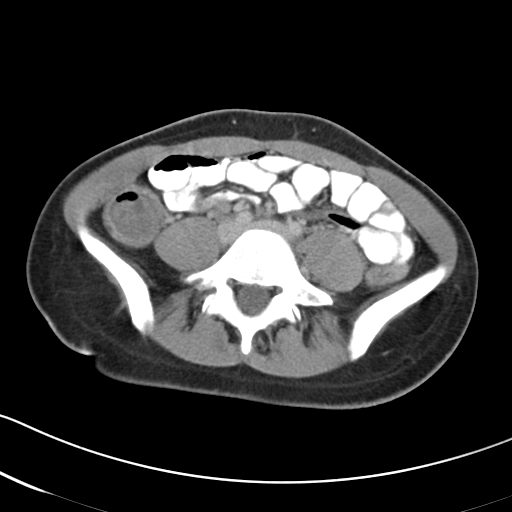
[im 46/86  soft-tissue]
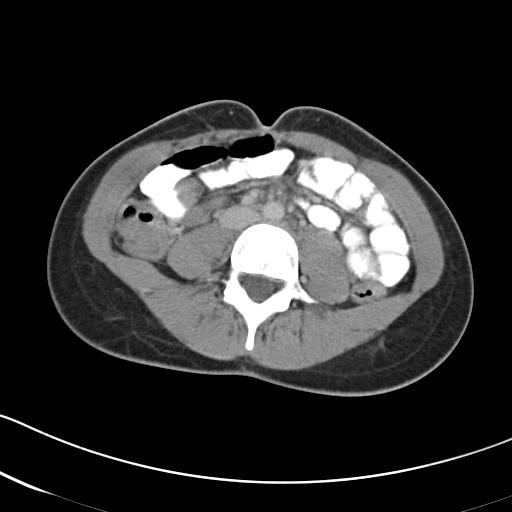
[im 53/86  soft-tissue]
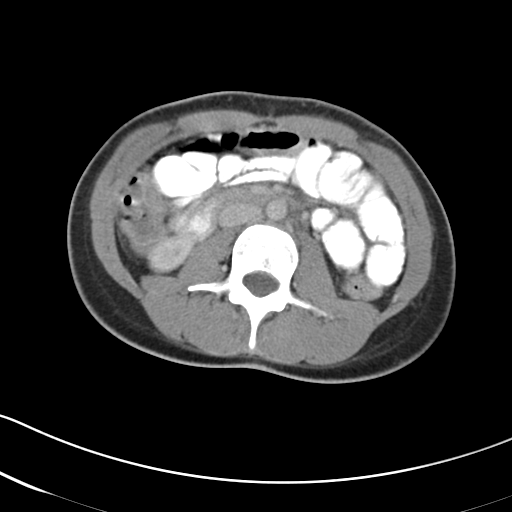
[im 53/86  bone]
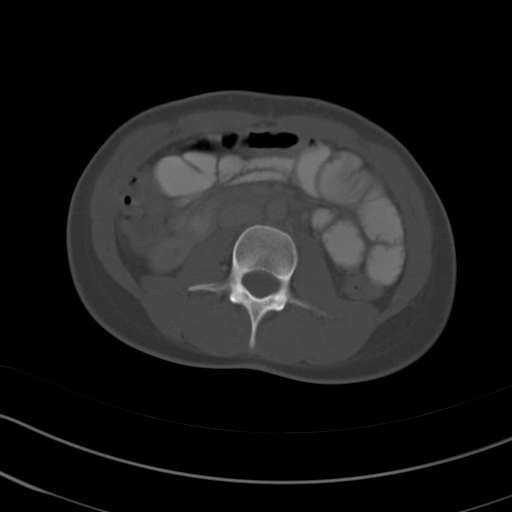
[im 56/86  soft-tissue]
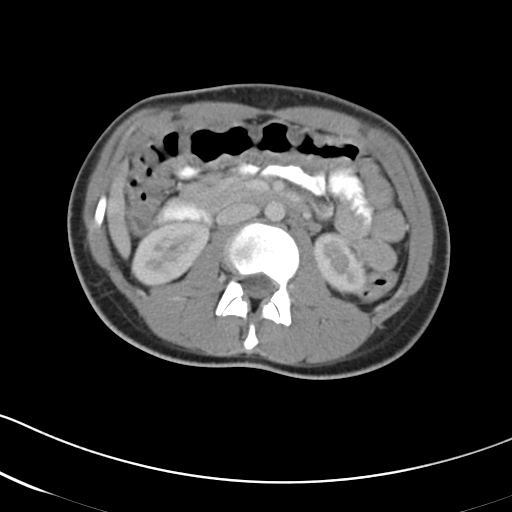
[im 63/86  soft-tissue]
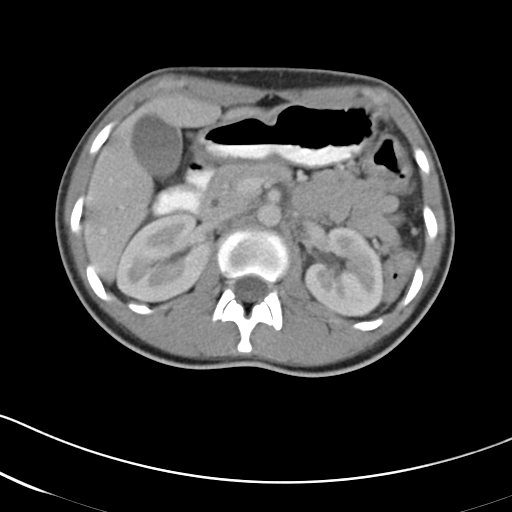
[im 69/86  soft-tissue]
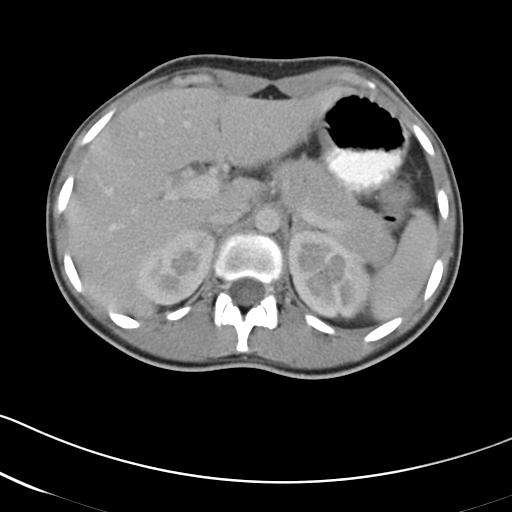
[im 72/86  lung]
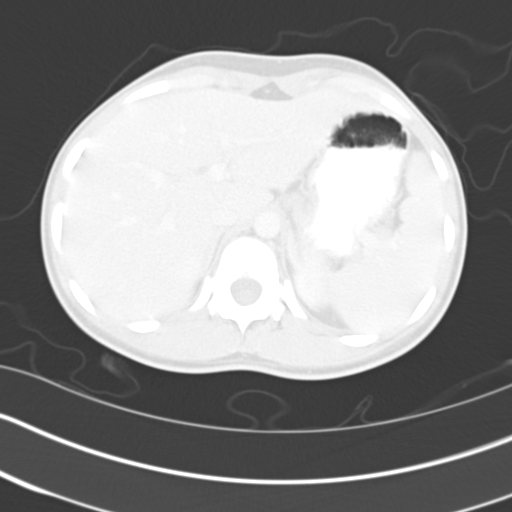
[im 76/86  soft-tissue]
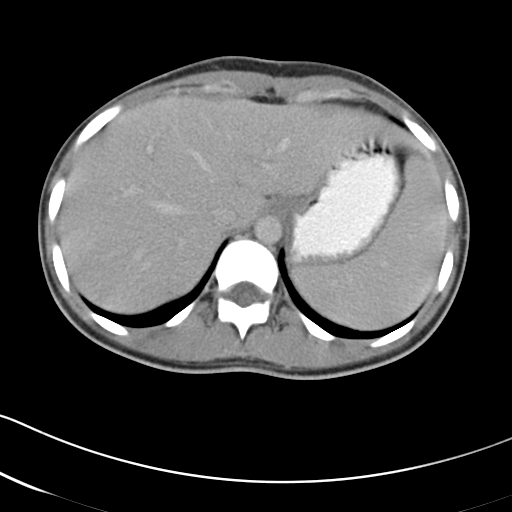
[im 76/86  lung]
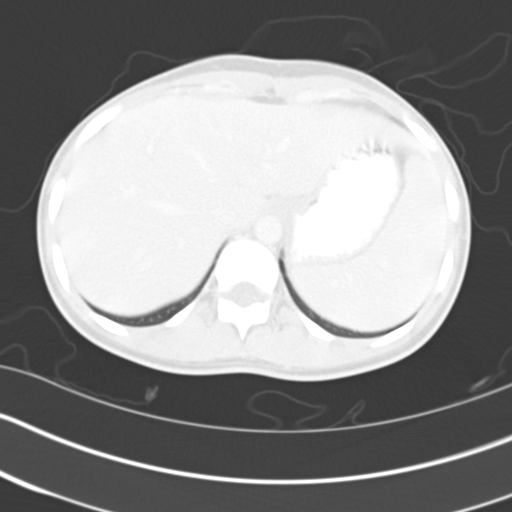
[im 79/86  lung]
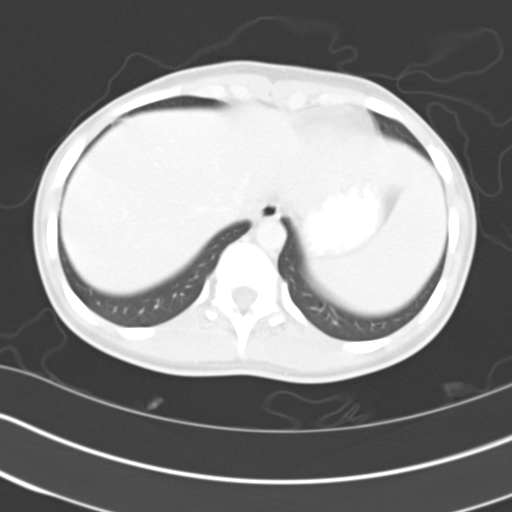
[im 82/86  soft-tissue]
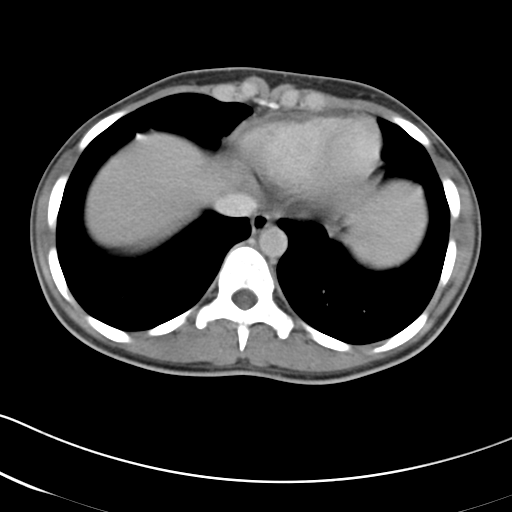
[im 82/86  lung]
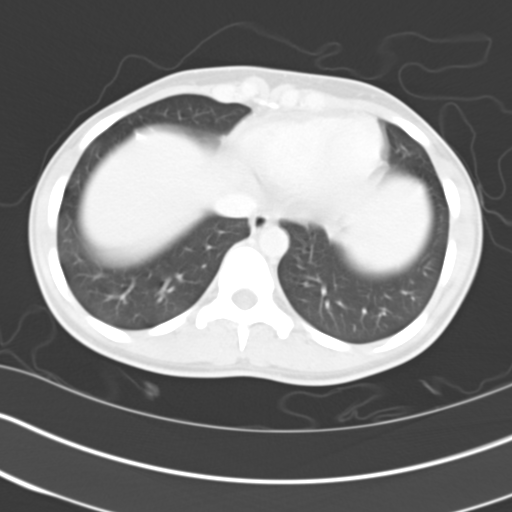

[16 of 32 positions shown; findings below may reference images not displayed]

FINDINGS: The appendix is normal. Remainder the bowel is unremarkable. There
are normal appearances of the liver, spleen, pancreas, adrenals and
kidneys. No acute inflammatory changes are evident in the abdomen or
pelvis. There is a 1.8 cm right adnexal cyst or follicle with a
small volume of surrounding fluid in the adnexal region.

There is no significant abnormality in the lower chest.
IMPRESSION: Small volume free fluid in the right adnexal region, apparently
associated with a 1.8 cm cyst or follicle on the right ovary. Normal
appendix.

## 2015-07-17 MED ORDER — MAGNESIUM SULFATE 2 GM/50ML IV SOLN
2.0000 g | Freq: Once | INTRAVENOUS | Status: AC
  Administered 2015-07-18: 2 g via INTRAVENOUS
  Filled 2015-07-17: qty 50

## 2015-07-17 MED ORDER — ONDANSETRON HCL 4 MG/2ML IJ SOLN
4.0000 mg | INTRAMUSCULAR | Status: AC
  Administered 2015-07-17: 4 mg via INTRAVENOUS

## 2015-07-17 MED ORDER — SODIUM CHLORIDE 0.9 % IV BOLUS (SEPSIS)
1000.0000 mL | Freq: Once | INTRAVENOUS | Status: AC
  Administered 2015-07-17: 1000 mL via INTRAVENOUS

## 2015-07-17 MED ORDER — IOHEXOL 300 MG/ML  SOLN
75.0000 mL | Freq: Once | INTRAMUSCULAR | Status: AC | PRN
  Administered 2015-07-17: 75 mL via INTRAVENOUS

## 2015-07-17 MED ORDER — ONDANSETRON HCL 4 MG/2ML IJ SOLN
INTRAMUSCULAR | Status: AC
  Filled 2015-07-17: qty 2

## 2015-07-17 MED ORDER — METRONIDAZOLE IN NACL 5-0.79 MG/ML-% IV SOLN
500.0000 mg | Freq: Once | INTRAVENOUS | Status: AC
Start: 2015-07-18 — End: 2015-07-18
  Administered 2015-07-18: 500 mg via INTRAVENOUS
  Filled 2015-07-17: qty 100

## 2015-07-17 MED ORDER — LOPERAMIDE HCL 2 MG PO CAPS
2.0000 mg | ORAL_CAPSULE | Freq: Once | ORAL | Status: AC
  Administered 2015-07-17: 2 mg via ORAL

## 2015-07-17 MED ORDER — ONDANSETRON 4 MG PO TBDP
ORAL_TABLET | ORAL | Status: AC
  Filled 2015-07-17: qty 1

## 2015-07-17 MED ORDER — IOHEXOL 240 MG/ML SOLN: 25.0000 mL | INTRAMUSCULAR | Status: AC

## 2015-07-17 MED ORDER — LOPERAMIDE HCL 2 MG PO CAPS
ORAL_CAPSULE | ORAL | Status: AC
  Filled 2015-07-17: qty 2

## 2015-07-17 NOTE — ED Notes (Signed)
Pt up to restroom to void, did not obtain urine sample as previously asked. Reminded pt will need urine sample with next void. Socks provided, extra warm blankets provided. Pt texting on phone.

## 2015-07-17 NOTE — ED Notes (Signed)
Pt with positive c diff. Dr. Fanny Bien notified.

## 2015-07-17 NOTE — ED Provider Notes (Addendum)
[facility] Emergency Department Provider Note  ____________________________________________  Time seen: Approximately 11:22 PM  I have reviewed the triage vital signs and the nursing notes.   HISTORY  Chief Complaint Abdominal Pain and Diarrhea    HPI Jennifer Byrd is a 19 y.o. female who awoke at about 8 AM with sudden onset of vomiting. Says she vomited several times this morning, improving some this afternoon and then began having about 8 loose nonbloody rather clear loose stools.  She denies having any fevers. She reports no chills. No chest pain or trouble breathing. No further palpitations, she was previously seen for this. She says that she has crampy pain that comes and goes mostly in the upper abdomen, this is gone away now and she is currently pain-free.   Past Medical History  Diagnosis Date  . Acid reflux   . Strep throat   . Heart palpitations     Patient Active Problem List   Diagnosis Date Noted  . Iron deficiency anemia 07/06/2015  . Palpitations 06/26/2015  . Acute urticaria 11/17/2014    No past surgical history on file.  Current Outpatient Rx  Name  Route  Sig  Dispense  Refill  . ferrous sulfate 220 (44 FE) MG/5ML solution   Oral   Take 5 mLs (220 mg total) by mouth 2 (two) times daily with a meal.   150 mL   3   . omeprazole (PRILOSEC) 40 MG capsule   Oral   Take 40 mg by mouth daily. Take 30 to 60 minutes prior to evening meal.         . cefdinir (OMNICEF) 300 MG capsule   Oral   Take 300 mg by mouth 2 (two) times daily.           Allergies Amoxicillin and Amoxicillin-pot clavulanate  Family History  Problem Relation Age of Onset  . Diabetes Mother   . Hypothyroidism Mother   . Diabetes Father   . Hypertension Father     Social History History  Substance Use Topics  . Smoking status: Never Smoker   . Smokeless tobacco: Not on file  . Alcohol Use: No    Review of Systems Constitutional:  No fever/chills Eyes: No visual changes. ENT: No sore throat, this is improved Cardiovascular: Denies chest pain. Respiratory: Denies shortness of breath. Gastrointestinal: See history of present illness Genitourinary: Negative for dysuria. Denies pregnancy Musculoskeletal: Negative for back pain. Skin: Negative for rash. Neurological: Negative for headaches, focal weakness or numbness. No vaginal discharge or bleeding. Denies pregnancy. She is currently on cefoxitin for laryngitis, which is evidently improved significantly.  10-point ROS otherwise negative.  ____________________________________________   PHYSICAL EXAM:  VITAL SIGNS: ED Triage Vitals  Enc Vitals Group     BP 07/17/15 2106 110/75 mmHg     Pulse Rate 07/17/15 2106 114     Resp 07/17/15 2206 20     Temp 07/17/15 2106 99 F (37.2 C)     Temp Source 07/17/15 2106 Oral     SpO2 07/17/15 2106 99 %     Weight --      Height --      Head Cir --      Peak Flow --      Pain Score 07/17/15 2031 9     Pain Loc --      Pain Edu? --      Excl. in GC? --     Constitutional: Alert and oriented. Well appearing  and in no acute distress. Somewhat anxious Eyes: Conjunctivae are normal. PERRL. EOMI. Head: Atraumatic. Nose: No congestion/rhinnorhea. Mouth/Throat: Mucous membranes are moist.  Oropharynx non-erythematous. Neck: No stridor.   Cardiovascular: Normal rate, regular rhythm. Grossly normal heart sounds.  Good peripheral circulation. Respiratory: Normal respiratory effort.  No retractions. Lungs CTAB. Gastrointestinal: Soft and nontender except for some mild tenderness in the right flank without rebound or guarding. No distention. No abdominal bruits. No CVA tenderness. Musculoskeletal: No lower extremity tenderness nor edema.  No joint effusions. Neurologic:  Normal speech and language. No gross focal neurologic deficits are appreciated. No gait instability. Skin:  Skin is warm, dry and intact. No rash  noted. Psychiatric: Mood and affect are normal. Speech and behavior are normal.  ____________________________________________   LABS (all labs ordered are listed, but only abnormal results are displayed)  Labs Reviewed  LIPASE, BLOOD - Abnormal; Notable for the following:    Lipase 19 (*)    All other components within normal limits  COMPREHENSIVE METABOLIC PANEL - Abnormal; Notable for the following:    Potassium 3.1 (*)    Chloride 98 (*)    Glucose, Bld 140 (*)    Total Protein 8.7 (*)    All other components within normal limits  CBC - Abnormal; Notable for the following:    WBC 19.4 (*)    RBC 5.41 (*)    Hemoglobin 11.4 (*)    MCV 67.8 (*)    MCH 21.1 (*)    MCHC 31.1 (*)    RDW 28.8 (*)    All other components within normal limits  URINALYSIS COMPLETEWITH MICROSCOPIC ([facility] ONLY) - Abnormal; Notable for the following:    Color, Urine YELLOW (*)    APPearance HAZY (*)    Ketones, ur TRACE (*)    Protein, ur 100 (*)    Squamous Epithelial / LPF 6-30 (*)    All other components within normal limits  C DIFFICILE QUICK SCAN W PCR REFLEX ([facility] ONLY)  STOOL CULTURE  PREGNANCY, URINE  POC URINE PREG, ED   ____________________________________________  EKG   ____________________________________________  RADIOLOGY  CT abdomen and pelvis is pending ____________________________________________   PROCEDURES  Procedure(s) performed: None  Critical Care performed: No  ____________________________________________   INITIAL IMPRESSION / ASSESSMENT AND PLAN / ED COURSE  Pertinent labs & imaging results that were available during my care of the patient were reviewed by me and considered in my medical decision making (see chart for details).  Nausea vomiting with crampy abdominal pain. History seems most suggestive of etiology such as gastroenteritis or other viral self-limited disease, but on laboratory evaluation the patient has a white count of 19,000 and is  recently on antibiotic's and had a hospitalization. Consideration is made for C. difficile, we will obtain CT of PCR and stool culture. She has no travel history and is afebrile in the ER. No bloody stool. Also has some mild right-sided tenderness along the flank, not truly over McBurney's point but given her white count 19,000 feel CT is indicated to rule out appendicitis and other acute intra-abdominal pathology.  Care and disposition assigned to Dr. Zenda Alpers. Plan of care is to follow up on CT and C. difficile PCR. If CT is unrevealing of etiology, I would likely treat her as gastroenteritis as we await stool cultures. Do not believe the patient is currently in need of antibiotic's last CTs to reveal evidence of colitis or other infectious etiology. ____________________________________________   FINAL CLINICAL IMPRESSION(S) / ED  DIAGNOSES  Final diagnoses:  Vomiting and diarrhea      Sharyn Creamer, MD 07/17/15 2329  C. difficile antigen positive. Discussed with Dr. Isac Sarna admit the patient to the hospital for ongoing hydration, repletion of potassium, and initiation of antibiotic's for Clostridium difficile infection.  Sharyn Creamer, MD 07/18/15 [phone]

## 2015-07-17 NOTE — ED Notes (Signed)
Pt reports improved nausea, states "i'm just nervous about the cat scan." cont pox in place, call bell at side. Pt sipping on po contrast. Mother at bedside, mother advised of enteric precautions.

## 2015-07-17 NOTE — ED Notes (Signed)
Patient reports diarrhea, sharpe abd pain and "my heart rate is going back up".  Pt is pale in appearance.

## 2015-07-17 NOTE — ED Notes (Signed)
Patient transported to CT 

## 2015-07-18 ENCOUNTER — Encounter: Payer: Self-pay | Admitting: *Deleted

## 2015-07-18 DIAGNOSIS — A047 Enterocolitis due to Clostridium difficile: Secondary | ICD-10-CM | POA: Diagnosis present

## 2015-07-18 DIAGNOSIS — Z8249 Family history of ischemic heart disease and other diseases of the circulatory system: Secondary | ICD-10-CM | POA: Diagnosis not present

## 2015-07-18 DIAGNOSIS — A0472 Enterocolitis due to Clostridium difficile, not specified as recurrent: Secondary | ICD-10-CM | POA: Diagnosis present

## 2015-07-18 DIAGNOSIS — R002 Palpitations: Secondary | ICD-10-CM | POA: Diagnosis present

## 2015-07-18 DIAGNOSIS — Z79899 Other long term (current) drug therapy: Secondary | ICD-10-CM | POA: Diagnosis not present

## 2015-07-18 DIAGNOSIS — Z880 Allergy status to penicillin: Secondary | ICD-10-CM | POA: Diagnosis not present

## 2015-07-18 DIAGNOSIS — J029 Acute pharyngitis, unspecified: Secondary | ICD-10-CM | POA: Insufficient documentation

## 2015-07-18 DIAGNOSIS — K219 Gastro-esophageal reflux disease without esophagitis: Secondary | ICD-10-CM | POA: Diagnosis present

## 2015-07-18 DIAGNOSIS — Z8619 Personal history of other infectious and parasitic diseases: Secondary | ICD-10-CM | POA: Diagnosis present

## 2015-07-18 DIAGNOSIS — D638 Anemia in other chronic diseases classified elsewhere: Secondary | ICD-10-CM | POA: Diagnosis present

## 2015-07-18 DIAGNOSIS — E860 Dehydration: Secondary | ICD-10-CM | POA: Diagnosis present

## 2015-07-18 DIAGNOSIS — E876 Hypokalemia: Secondary | ICD-10-CM | POA: Diagnosis present

## 2015-07-18 DIAGNOSIS — Z833 Family history of diabetes mellitus: Secondary | ICD-10-CM | POA: Diagnosis not present

## 2015-07-18 LAB — BASIC METABOLIC PANEL
ANION GAP: 7 (ref 5–15)
BUN: 6 mg/dL (ref 6–20)
CHLORIDE: 105 mmol/L (ref 101–111)
CO2: 25 mmol/L (ref 22–32)
CREATININE: 0.6 mg/dL (ref 0.44–1.00)
Calcium: 8.6 mg/dL — ABNORMAL LOW (ref 8.9–10.3)
Glucose, Bld: 132 mg/dL — ABNORMAL HIGH (ref 65–99)
Potassium: 3.3 mmol/L — ABNORMAL LOW (ref 3.5–5.1)
Sodium: 137 mmol/L (ref 135–145)

## 2015-07-18 LAB — CBC
HEMATOCRIT: 30.9 % — AB (ref 35.0–47.0)
Hemoglobin: 9.5 g/dL — ABNORMAL LOW (ref 12.0–16.0)
MCH: 21 pg — ABNORMAL LOW (ref 26.0–34.0)
MCHC: 30.8 g/dL — ABNORMAL LOW (ref 32.0–36.0)
MCV: 68.2 fL — ABNORMAL LOW (ref 80.0–100.0)
PLATELETS: 300 10*3/uL (ref 150–440)
RBC: 4.53 MIL/uL (ref 3.80–5.20)
RDW: 29.8 % — AB (ref 11.5–14.5)
WBC: 14.5 10*3/uL — ABNORMAL HIGH (ref 3.6–11.0)

## 2015-07-18 LAB — CLOSTRIDIUM DIFFICILE BY PCR: Toxigenic C. Difficile by PCR: POSITIVE — AB

## 2015-07-18 MED ORDER — FERROUS SULFATE 220 (44 FE) MG/5ML PO ELIX
220.0000 mg | ORAL_SOLUTION | Freq: Two times a day (BID) | ORAL | Status: DC
  Administered 2015-07-18: 220 mg via ORAL
  Filled 2015-07-18 (×3): qty 5

## 2015-07-18 MED ORDER — OXYCODONE HCL 5 MG PO TABS: 5.0000 mg | ORAL_TABLET | ORAL | Status: DC | PRN

## 2015-07-18 MED ORDER — PANTOPRAZOLE SODIUM 40 MG PO TBEC
40.0000 mg | DELAYED_RELEASE_TABLET | Freq: Every day | ORAL | Status: DC
Start: 2015-07-18 — End: 2015-07-18

## 2015-07-18 MED ORDER — SACCHAROMYCES BOULARDII 250 MG PO CAPS: 250.0000 mg | ORAL_CAPSULE | Freq: Two times a day (BID) | ORAL | Status: DC

## 2015-07-18 MED ORDER — POTASSIUM CHLORIDE 20 MEQ PO PACK
40.0000 meq | PACK | Freq: Once | ORAL | Status: AC
  Administered 2015-07-18: 40 meq via ORAL
  Filled 2015-07-18: qty 2

## 2015-07-18 MED ORDER — POTASSIUM CHLORIDE IN NACL 20-0.9 MEQ/L-% IV SOLN
INTRAVENOUS | Status: DC
  Administered 2015-07-18: 06:00:00 via INTRAVENOUS
  Filled 2015-07-18 (×2): qty 1000

## 2015-07-18 MED ORDER — CEFUROXIME AXETIL 500 MG PO TABS
500.0000 mg | ORAL_TABLET | Freq: Two times a day (BID) | ORAL | Status: DC
  Administered 2015-07-18: 500 mg via ORAL
  Filled 2015-07-18 (×2): qty 1

## 2015-07-18 MED ORDER — SACCHAROMYCES BOULARDII 250 MG PO CAPS
250.0000 mg | ORAL_CAPSULE | Freq: Two times a day (BID) | ORAL | Status: DC
  Administered 2015-07-18: 250 mg via ORAL
  Filled 2015-07-18: qty 1

## 2015-07-18 MED ORDER — PANTOPRAZOLE SODIUM 40 MG PO TBEC: 40.0000 mg | DELAYED_RELEASE_TABLET | Freq: Every day | ORAL | Status: DC

## 2015-07-18 MED ORDER — ACETAMINOPHEN 325 MG PO TABS: 650.0000 mg | ORAL_TABLET | Freq: Four times a day (QID) | ORAL | Status: DC | PRN

## 2015-07-18 MED ORDER — METRONIDAZOLE IN NACL 5-0.79 MG/ML-% IV SOLN
500.0000 mg | Freq: Three times a day (TID) | INTRAVENOUS | Status: DC
  Administered 2015-07-18: 500 mg via INTRAVENOUS
  Filled 2015-07-18 (×4): qty 100

## 2015-07-18 MED ORDER — METRONIDAZOLE 500 MG PO TABS: 500.0000 mg | ORAL_TABLET | Freq: Three times a day (TID) | ORAL | Status: DC

## 2015-07-18 MED ORDER — ACETAMINOPHEN 650 MG RE SUPP: 650.0000 mg | Freq: Four times a day (QID) | RECTAL | Status: DC | PRN

## 2015-07-18 NOTE — Discharge Instructions (Signed)
Be sure to keep your follow-up appointment with your primary care doctor this week.    Notify MD for any worsening diarrhea, nausea or vomiting.  Be sure to take the full regimen of antibiotics.

## 2015-07-18 NOTE — ED Notes (Signed)
Pt to triage via w/c with no distress noted; st seen here earlier for cdiff; received antibiotics here; st awoke from nap this evening and throat felt itchy; denies rash or itching; denies diff breathing or swallowing

## 2015-07-18 NOTE — ED Notes (Signed)
Pt up to restroom to void after returning from ct scan. Dr. Fanny Bien in to speak with pt and family.

## 2015-07-18 NOTE — ED Notes (Addendum)
Spoke with pharmacist, pt declining second iv start for flagyl and magnesium infusion. Pharmacist kristin states po magnesium is compatible with flagyl. Pt states would like to receive one medication at a time. Pt informed of pharmd recommendation. Due to pt's wishes, one medication will be infused at a time via iv.

## 2015-07-18 NOTE — H&P (Signed)
[facility] - [place] at [facility]   PATIENT NAME: Jennifer Byrd    MR#:  [id number]  DATE OF BIRTH:  [birth year]  DATE OF ADMISSION:  07/17/2015  PRIMARY CARE PHYSICIAN: No PCP Per Patient   REQUESTING/REFERRING PHYSICIAN: dr.Quale  CHIEF COMPLAINT:   Abdominal pain and diarrhea HISTORY OF PRESENT ILLNESS:  Jennifer Byrd  is a 19 y.o. female with a known history of anemia, GERD, palpitations and recent diagnosis of acute pharyngitis was treated with antibiotics but patient stopped them and restarted on last Tuesday. Today she woke up at 8 AM with sudden onset of vomiting and abdominal pain. By noon she started having loose nonbloodystool. In the ED stool for C. difficile toxin is positive. CAT scan of the abdomen with no other abnormalities. Patient is started and Flagyl IV as she was nauseous  PAST MEDICAL HISTORY:   Past Medical History  Diagnosis Date  . Acid reflux   . Strep throat   . Heart palpitations     PAST SURGICAL HISTOIRY:  None  SOCIAL HISTORY:   History  Substance Use Topics  . Smoking status: Never Smoker   . Smokeless tobacco: Not on file  . Alcohol Use: No    FAMILY HISTORY:   Family History  Problem Relation Age of Onset  . Diabetes Mother   . Hypothyroidism Mother   . Diabetes Father   . Hypertension Father     DRUG ALLERGIES:   Allergies  Allergen Reactions  . Amoxicillin Hives, Rash and Other (See Comments)    Patient states it makes her neck break out. Patient states it makes her neck break out.  [name] Amoxicillin-Pot Clavulanate Rash    REVIEW OF SYSTEMS:  CONSTITUTIONAL: No fever, reporting fatigue and weakness.  EYES: No blurred or double vision.  EARS, NOSE, AND THROAT: No tinnitus or ear pain.  RESPIRATORY: No cough, shortness of breath, wheezing or hemoptysis.  CARDIOVASCULAR: No chest pain, orthopnea, edema. Has history of palpitations and sees cardiology, next appointment is today  a.m. GASTROINTESTINAL: Has nausea, denies vomiting, reporting diarrhea and abdominal pain. Has history of GERD GENITOURINARY: No dysuria, hematuria.  ENDOCRINE: No polyuria, nocturia,  HEMATOLOGY: Has history of anemia, denies easy bruising or bleeding SKIN: No rash or lesion. MUSCULOSKELETAL: No joint pain or arthritis.   NEUROLOGIC: No tingling, numbness, weakness.  PSYCHIATRY: No anxiety or depression.   MEDICATIONS AT HOME:   Prior to Admission medications   Medication Sig Start Date End Date Taking? Authorizing Provider  ferrous sulfate 220 (44 FE) MG/5ML solution Take 5 mLs (220 mg total) by mouth 2 (two) times daily with a meal. 06/27/15  Yes Houston Siren, MD  omeprazole (PRILOSEC) 40 MG capsule Take 40 mg by mouth daily. Take 30 to 60 minutes prior to evening meal.   Yes Historical Provider, MD  cefdinir (OMNICEF) 300 MG capsule Take 300 mg by mouth 2 (two) times daily.    Historical Provider, MD      VITAL SIGNS:  Blood pressure 107/72, pulse 88, temperature 99 F (37.2 C), temperature source Oral, resp. rate 20, last menstrual period 07/11/2015, SpO2 100 %.  PHYSICAL EXAMINATION:  GENERAL:  19 y.o.-year-old patient lying in the bed with no acute distress.  EYES: Pupils equal, round, reactive to light and accommodation. No scleral icterus. Extraocular muscles intact.  HEENT: Head atraumatic, normocephalic. Oropharynx and nasopharynx clear.  NECK:  Supple, no jugular venous distention. No thyroid enlargement, no tenderness.  LUNGS: Normal breath sounds bilaterally,  no wheezing, rales,rhonchi or crepitation. No use of accessory muscles of respiration.  CARDIOVASCULAR: S1, S2 normal. No murmurs, rubs, or gallops.  ABDOMEN: Soft, nontender, some abdominal discomfort, nondistended. Bowel sounds present. No organomegaly or mass.  EXTREMITIES: No pedal edema, cyanosis, or clubbing.  NEUROLOGIC: Cranial nerves II through XII are intact. Muscle strength 5/5 in all extremities.  Sensation intact. Gait not checked.  PSYCHIATRIC: The patient is alert and oriented x 3.  SKIN: No obvious rash, lesion, or ulcer.   LABORATORY PANEL:   CBC  Recent Labs Lab 07/17/15 2045  WBC 19.4*  HGB 11.4*  HCT 36.7  PLT 354   ------------------------------------------------------------------------------------------------------------------  Chemistries   Recent Labs Lab 07/17/15 2045  NA 135  K 3.1*  CL 98*  CO2 27  GLUCOSE 140*  BUN 7  CREATININE 0.82  CALCIUM 10.0  AST 25  ALT 19  ALKPHOS 69  BILITOT 0.4   ------------------------------------------------------------------------------------------------------------------  Cardiac Enzymes No results for input(s): TROPONINI in the last 168 hours. ------------------------------------------------------------------------------------------------------------------  RADIOLOGY:  Ct Abdomen Pelvis W Contrast  07/18/2015   CLINICAL DATA:  Abdomen pain, vomiting and leukocytosis.  EXAM: CT ABDOMEN AND PELVIS WITH CONTRAST  TECHNIQUE: Multidetector CT imaging of the abdomen and pelvis was performed using the standard protocol following bolus administration of intravenous contrast.  CONTRAST:  75mL OMNIPAQUE IOHEXOL 300 MG/ML  SOLN  COMPARISON:  None.  FINDINGS: The appendix is normal. Remainder the bowel is unremarkable. There are normal appearances of the liver, spleen, pancreas, adrenals and kidneys. No acute inflammatory changes are evident in the abdomen or pelvis. There is a 1.8 cm right adnexal cyst or follicle with a small volume of surrounding fluid in the adnexal region.  There is no significant abnormality in the lower chest.  IMPRESSION: Small volume free fluid in the right adnexal region, apparently associated with a 1.8 cm cyst or follicle on the right ovary. Normal appendix.   Electronically Signed   By: Ellery Plunk M.D.   On: 07/18/2015 00:42    EKG:   Orders placed or performed during the hospital  encounter of 07/01/15  . ED EKG (<53mins upon arrival to the ED)  . ED EKG (<36mins upon arrival to the ED)  . EKG    IMPRESSION AND PLAN:   Jennifer Byrd  is a 19 y.o. female with a known history of anemia, GERD, palpitations and recent diagnosis of acute pharyngitis was treated with antibiotics but patient stopped them and restarted on last Tuesday. Today she woke up at 8 AM with sudden onset of vomiting and abdominal pain. By noon she started having loose nonbloodystool. In the ED stool for C. difficile toxin is positive  1. Generalized abdominal pain with diarrhea from C. difficile colitis  Admit patient to MedSurg unit Flagyl 500 mg IV every 8 hours as patient is nauseous Probiotics Pain management as needed basis  2. Dehydration from nausea vomiting and diarrhea Provide antiemetics Protonix for GI prophylaxis Hydration with IV fluids Antibiotics for C. difficile colitis and probiotics  3. Recent history of acute pharyngitis Continue Omnicef for a total of 10 days, which was started on last Tuesday  4. History of palpitations Outpatient follow-up with cardiology as recommended, patient is to reschedule her appointment which is scheduled for today. Patient was placed on [employer] monitor recently by cardiology   5. Hypokalemia from problem #1 Replete potassium and check magnesium in a.m. BMP in a.m.   GI prophylaxis with Protonix DVT prophylaxis with early ambulation  All the records are reviewed and case discussed with ED provider. Management plans discussed with the patient, family and they are in agreement.  CODE STATUS: full code, healthcare power of attorney mom  TOTAL TIME TAKING CARE OF THIS PATIENT: Reviewing medical records, history and physical, admission orders and coordination of care-45 minutes.    Ramonita Lab M.D on 07/18/2015 at 1:30 AM  Between 7am to 6pm - Pager - [phone]  After 6pm go to [email or web address] - password [email or web address] [facility]  [facility]  Office  [phone]  CC: Primary care physician; No PCP Per Patient

## 2015-07-18 NOTE — ED Notes (Signed)
Pt assisted up to restroom to void. Pt updated on admission process.

## 2015-07-18 NOTE — Discharge Summary (Signed)
[facility] - [place] at [facility]   PATIENT NAME: Jennifer Byrd    MR#:  [id number]  DATE OF BIRTH:  1996-10-14  DATE OF ADMISSION:  07/17/2015 ADMITTING PHYSICIAN: Ramonita Lab, MD  DATE OF DISCHARGE: 07/18/2015 PRIMARY CARE PHYSICIAN: No PCP Per Patient    ADMISSION DIAGNOSIS:  Hypokalemia [E87.6] Clostridium difficile colitis [A04.7] Vomiting and diarrhea [R11.10, R19.7]  DISCHARGE DIAGNOSIS:  Active Problems:   Clostridium difficile colitis   C. difficile colitis   SECONDARY DIAGNOSIS:   Past Medical History  Diagnosis Date  . Acid reflux   . Strep throat   . Heart palpitations     HOSPITAL COURSE:  This is a 19 year old female with a history of anemia and palpitations with recent diagnosis of acute pharyngitis he was on antibiotics and presented to the emergency room with loose stools and was found to have C. difficile. For further details please refer the H&P.  1. C. difficile colitis: Patient was admitted to the hospital service. She was started on Flagyl. She tolerated this medicine well. Her diarrhea has improved. She is having loose nonbloody stools.  2. Acute pharyngitis: She should continue her antibiotics. I also added probiotic to her regimen.  3. Anemia of chronic disease: Her hemoglobin remained stable.  4. History of palpitations: Patient is outpatient cardiology follow-up.  5. Hypokalemia: This is from diarrhea. Her potassium is being repleted.   DISCHARGE CONDITIONS AND DIET:  Patient is being discharged in stable condition on a regular diet  CONSULTS OBTAINED:  Treatment Team:  Ramonita Lab, MD  DRUG ALLERGIES:   Allergies  Allergen Reactions  . Amoxicillin Hives, Rash and Other (See Comments)    Patient states it makes her neck break out. Patient states it makes her neck break out.  [name] Amoxicillin-Pot Clavulanate Rash    DISCHARGE MEDICATIONS:   Current Discharge Medication List    START taking these  medications   Details  metroNIDAZOLE (FLAGYL) 500 MG tablet Take 1 tablet (500 mg total) by mouth 3 (three) times daily. Qty: 13 tablet, Refills: 39    saccharomyces boulardii (FLORASTOR) 250 MG capsule Take 1 capsule (250 mg total) by mouth 2 (two) times daily. Qty: 30 capsule, Refills: 0      CONTINUE these medications which have NOT CHANGED   Details  ferrous sulfate 220 (44 FE) MG/5ML solution Take 5 mLs (220 mg total) by mouth 2 (two) times daily with a meal. Qty: 150 mL, Refills: 3    omeprazole (PRILOSEC) 40 MG capsule Take 40 mg by mouth daily. Take 30 to 60 minutes prior to evening meal.    cefdinir (OMNICEF) 300 MG capsule Take 300 mg by mouth 2 (two) times daily.              Today   CHIEF COMPLAINT:  Patient has improved symptoms in her diarrhea. No nausea or vomiting. She is tolerating her diet.   VITAL SIGNS:  Blood pressure 100/70, pulse 81, temperature 98.5 F (36.9 C), temperature source Oral, resp. rate 16, height  (1.676 m), weight 53.116 kg (117 lb 1.6 oz), last menstrual period 07/11/2015, SpO2 100 %.   REVIEW OF SYSTEMS:  Review of Systems  Constitutional: Negative for fever, chills and malaise/fatigue.  HENT: Negative for sore throat.   Eyes: Negative for blurred vision.  Respiratory: Negative for cough, hemoptysis, shortness of breath and wheezing.   Cardiovascular: Negative for chest pain, palpitations and leg swelling.  Gastrointestinal: Positive for diarrhea. Negative for nausea,  vomiting, abdominal pain and blood in stool.  Genitourinary: Negative for dysuria.  Musculoskeletal: Negative for back pain.  Neurological: Negative for dizziness, tremors and headaches.  Endo/Heme/Allergies: Does not bruise/bleed easily.     PHYSICAL EXAMINATION:  GENERAL:  19 y.o.-year-old patient lying in the bed with no acute distress.  NECK:  Supple, no jugular venous distention. No thyroid enlargement, no tenderness.  LUNGS: Normal breath sounds  bilaterally, no wheezing, rales,rhonchi  No use of accessory muscles of respiration.  CARDIOVASCULAR: S1, S2 normal. No murmurs, rubs, or gallops.  ABDOMEN: Soft, non-tender, non-distended. Bowel sounds present. No organomegaly or mass.  EXTREMITIES: No pedal edema, cyanosis, or clubbing.  PSYCHIATRIC: The patient is alert and oriented x 3. Flat affect SKIN: No obvious rash, lesion, or ulcer.   DATA REVIEW:   CBC  Recent Labs Lab 07/18/15 0624  WBC 14.5*  HGB 9.5*  HCT 30.9*  PLT 300    Chemistries   Recent Labs Lab 07/17/15 2045 07/18/15 0624  NA 135 137  K 3.1* 3.3*  CL 98* 105  CO2 27 25  GLUCOSE 140* 132*  BUN 7 6  CREATININE 0.82 0.60  CALCIUM 10.0 8.6*  AST 25  --   ALT 19  --   ALKPHOS 69  --   BILITOT 0.4  --     Cardiac Enzymes No results for input(s): TROPONINI in the last 168 hours.  Microbiology Results  @[email or web address] @  RADIOLOGY:  Ct Abdomen Pelvis W Contrast  07/18/2015   IMPRESSION: Small volume free fluid in the right adnexal region, apparently associated with a 1.8 cm cyst or follicle on the right ovary. Normal appendix.   Electronically Signed   By: Ellery Plunk M.D.   On: 07/18/2015 00:42      Management plans discussed with the patient and mother and they are in agreement. Stable for discharge home  Patient should follow up with PCP in one week  CODE STATUS:     Code Status Orders        Start     Ordered   07/18/15 0351  Full code   Continuous     07/18/15 0351      TOTAL TIME TAKING CARE OF THIS PATIENT: 37 minutes.    Tamee Battin M.D on 07/18/2015 at 11:17 AM  Between 7am to 6pm - Pager - [phone] After 6pm go to [email or web address] - password [email or web address] [facility]  [place] [facility]  Office  [phone]  CC: Primary care physician; No PCP Per Patient

## 2015-07-18 NOTE — ED Notes (Signed)
Report to floor

## 2015-07-19 ENCOUNTER — Encounter: Payer: Self-pay | Admitting: Emergency Medicine

## 2015-07-19 ENCOUNTER — Emergency Department
Admission: EM | Admit: 2015-07-19 | Discharge: 2015-07-19 | Disposition: A | Discharge status: Home / Self Care | Payer: Federal, State, Local not specified - PPO | Attending: Emergency Medicine | Admitting: Emergency Medicine

## 2015-07-19 ENCOUNTER — Telehealth: Payer: Self-pay | Admitting: Emergency Medicine

## 2015-07-19 DIAGNOSIS — J029 Acute pharyngitis, unspecified: Secondary | ICD-10-CM

## 2015-07-19 MED ORDER — AZITHROMYCIN 200 MG/5ML PO SUSR: 500.0000 mg | Freq: Every day | ORAL | Status: DC

## 2015-07-19 MED ORDER — AZITHROMYCIN 200 MG/5ML PO SUSR
500.0000 mg | Freq: Once | ORAL | Status: AC
  Administered 2015-07-19: 500 mg via ORAL
  Filled 2015-07-19: qty 1

## 2015-07-19 NOTE — ED Notes (Signed)
Pt reports starting antibiotics for c.diff yesterday.  Pt reports throat feels dry and tender, mostly left side.  Pt also reports rash to leg.  Red areas noted on left lower leg.  Pt reports itching.  Pt NAD at this time.

## 2015-07-19 NOTE — ED Provider Notes (Signed)
[facility] Emergency Department Provider Note  ____________________________________________  Time seen: 3:00  I have reviewed the triage vital signs and the nursing notes.   HISTORY  Chief Complaint Allergic Reaction      HPI Jennifer Byrd is a 19 y.o. female presents with concern for allergic reaction patient states that she awoke with her throat feeling dry and tender mostly on the left side. Patient was recently discharged from the hospital with diagnosis of C. difficile and strep throat. Patient denies any rash no dyspnea no difficulty swallowing     Past Medical History  Diagnosis Date  . Acid reflux   . Strep throat   . Heart palpitations     Patient Active Problem List   Diagnosis Date Noted  . Clostridium difficile colitis 07/18/2015  . C. difficile colitis 07/18/2015  . Iron deficiency anemia 07/06/2015  . Palpitations 06/26/2015  . Acute urticaria 11/17/2014    History reviewed. No pertinent past surgical history.  Current Outpatient Rx  Name  Route  Sig  Dispense  Refill  . cefdinir (OMNICEF) 300 MG capsule   Oral   Take 300 mg by mouth 2 (two) times daily.         . ferrous sulfate 220 (44 FE) MG/5ML solution   Oral   Take 5 mLs (220 mg total) by mouth 2 (two) times daily with a meal.   150 mL   3   . metroNIDAZOLE (FLAGYL) 500 MG tablet   Oral   Take 1 tablet (500 mg total) by mouth 3 (three) times daily.   13 tablet   39   . omeprazole (PRILOSEC) 40 MG capsule   Oral   Take 40 mg by mouth daily. Take 30 to 60 minutes prior to evening meal.         . saccharomyces boulardii (FLORASTOR) 250 MG capsule   Oral   Take 1 capsule (250 mg total) by mouth 2 (two) times daily.   30 capsule   0     Allergies Amoxicillin and Amoxicillin-pot clavulanate  Family History  Problem Relation Age of Onset  . Diabetes Mother   . Hypothyroidism Mother   . Diabetes Father   . Hypertension Father     Social  History History  Substance Use Topics  . Smoking status: Never Smoker   . Smokeless tobacco: Not on file  . Alcohol Use: No    Review of Systems  Constitutional: Negative for fever. Eyes: Negative for visual changes. ENT: Negative for sore throat. Cardiovascular: Negative for chest pain. Respiratory: Negative for shortness of breath. Gastrointestinal: Negative for abdominal pain, vomiting and diarrhea. Genitourinary: Negative for dysuria. Musculoskeletal: Negative for back pain. Skin: Negative for rash. Neurological: Negative for headaches, focal weakness or numbness.   10-point ROS otherwise negative.  ____________________________________________   PHYSICAL EXAM:  VITAL SIGNS: ED Triage Vitals  Enc Vitals Group     BP 07/18/15 2107 126/68 mmHg     Pulse Rate 07/18/15 2107 81     Resp 07/18/15 2107 18     Temp 07/18/15 2107 97.3 F (36.3 C)     Temp Source 07/18/15 2107 Oral     SpO2 07/18/15 2107 100 %     Weight 07/18/15 2107 117 lb (53.071 kg)     Height 07/18/15 2107  (1.676 m)     Head Cir --      Peak Flow --      Pain Score 07/18/15 2108 0  Pain Loc --      Pain Edu? --      Excl. in GC? --      Constitutional: Alert and oriented. Well appearing and in no distress. Eyes: Conjunctivae are normal. PERRL. Normal extraocular movements. ENT   Head: Normocephalic and atraumatic.   Nose: No congestion/rhinnorhea.   Mouth/Throat: Mucous membranes are moist.   Neck: No stridor. Hematological/Lymphatic/Immunilogical: No cervical lymphadenopathy. Cardiovascular: Normal rate, regular rhythm. Normal and symmetric distal pulses are present in all extremities. No murmurs, rubs, or gallops. Respiratory: Normal respiratory effort without tachypnea nor retractions. Breath sounds are clear and equal bilaterally. No wheezes/rales/rhonchi. Gastrointestinal: Soft and nontender. No distention. There is no CVA tenderness. Genitourinary:  deferred Musculoskeletal: Nontender with normal range of motion in all extremities. No joint effusions.  No lower extremity tenderness nor edema. Neurologic:  Normal speech and language. No gross focal neurologic deficits are appreciated. Speech is normal.  Skin:  Skin is warm, dry and intact. No rash noted. Psychiatric: Mood and affect are normal. Speech and behavior are normal. Patient exhibits appropriate insight and judgment.      INITIAL IMPRESSION / ASSESSMENT AND PLAN / ED COURSE  Pertinent labs & imaging results that were available during my care of the patient were reviewed by me and considered in my medical decision making (see chart for details).  History of physical exam unlikely to be secondary to allergic reaction however patient does have a penicillin allergy and is markedly concerned about possibility of being allergic to [facility]. Patient very anxious in this regard. As such we'll discontinue Omnicef and prescribed azithromycin ___________________________________________   FINAL CLINICAL IMPRESSION(S) / ED DIAGNOSES  Final diagnoses:  Sore throat      Darci Current, MD 07/19/15 0345

## 2015-07-19 NOTE — ED Notes (Signed)
Pa tient called and said that she was told by er doctor that her flagyl rx was not enough, and that we would get in touch with [facility] [address] to fix.  i called dr mody and clarified the flagyl dose.  She wants flagyl 500 mg 3 times a day for 13 days (quantity of 39)  [facility] informed.  Also dr Cyril Loosen clarified the rx for azithromycin and said patient should have azithromycin 250 mg daily for 4 days. (took the 500 mg dose in ED.)  This was also called to [facility].

## 2015-07-19 NOTE — Discharge Instructions (Signed)

## 2015-07-20 ENCOUNTER — Encounter: Payer: Self-pay | Admitting: Obstetrics and Gynecology

## 2015-07-20 LAB — STOOL CULTURE: Special Requests: NORMAL

## 2015-07-21 NOTE — Progress Notes (Signed)
[facility]  Telephone:([phone]) [phone] Fax:([phone]) [phone]  ID: Jennifer Byrd OB: 24-Jul-1996  MR#: [id number]  AOZ#:[id number]  Patient Care Team: No Pcp Per Patient as PCP - General (General Practice)  CHIEF COMPLAINT:  Chief Complaint  Patient presents with  . Anemia  . New Evaluation    INTERVAL HISTORY: Patient is a 19 year old female who was recently admitted to the hospital with C. difficile colitis. During her hospital admission she was found to be anemic with some mild iron deficiency. She currently feels well and is fully recovered. She has no neurologic complaints. She denies any recent fevers. She denies any weakness or fatigue. She has no chest pain or shortness of breath. She denies any nausea, vomiting, constipation, or diarrhea. She has no urinary complaints. Patient feels back to her baseline and offers no specific complaints today.  REVIEW OF SYSTEMS:   Review of Systems  Constitutional: Negative.   Respiratory: Negative.   Cardiovascular: Negative.   Gastrointestinal: Negative.     As per HPI. Otherwise, a complete review of systems is negatve.  PAST MEDICAL HISTORY: Past Medical History  Diagnosis Date  . Acid reflux   . Strep throat   . Heart palpitations     PAST SURGICAL HISTORY: No past surgical history on file.  FAMILY HISTORY Family History  Problem Relation Age of Onset  . Diabetes Mother   . Hypothyroidism Mother   . Diabetes Father   . Hypertension Father        ADVANCED DIRECTIVES:    HEALTH MAINTENANCE: History  Substance Use Topics  . Smoking status: Never Smoker   . Smokeless tobacco: Not on file  . Alcohol Use: No     Colonoscopy:  PAP:  Bone density:  Lipid panel:  Allergies  Allergen Reactions  . Amoxicillin Hives, Rash and Other (See Comments)    Patient states it makes her neck break out. Patient states it makes her neck break out.  [name] Amoxicillin-Pot Clavulanate Rash    Current  Outpatient Prescriptions  Medication Sig Dispense Refill  . ferrous sulfate 220 (44 FE) MG/5ML solution Take 5 mLs (220 mg total) by mouth 2 (two) times daily with a meal. 150 mL 3  . omeprazole (PRILOSEC) 40 MG capsule Take 40 mg by mouth daily. Take 30 to 60 minutes prior to evening meal.    . azithromycin (ZITHROMAX) 200 MG/5ML suspension Take 12.5 mLs (500 mg total) by mouth daily. 22.5 mL 0  . cefdinir (OMNICEF) 300 MG capsule Take 300 mg by mouth 2 (two) times daily.    . metroNIDAZOLE (FLAGYL) 500 MG tablet Take 1 tablet (500 mg total) by mouth 3 (three) times daily. 13 tablet 39  . saccharomyces boulardii (FLORASTOR) 250 MG capsule Take 1 capsule (250 mg total) by mouth 2 (two) times daily. 30 capsule 0   No current facility-administered medications for this visit.    OBJECTIVE: Filed Vitals:   07/06/15 1135  BP: 102/69  Pulse: 80  Temp: 98 F (36.7 C)     Body mass index is 18.87 kg/(m^2).    ECOG FS:0 - Asymptomatic  General: Well-developed, well-nourished, no acute distress. Eyes: Pink conjunctiva, anicteric sclera. HEENT: Normocephalic, moist mucous membranes, clear oropharnyx. Lungs: Clear to auscultation bilaterally. Heart: Regular rate and rhythm. No rubs, murmurs, or gallops. Abdomen: Soft, nontender, nondistended. No organomegaly noted, normoactive bowel sounds. Musculoskeletal: No edema, cyanosis, or clubbing. Neuro: Alert, answering all questions appropriately. Cranial nerves grossly intact. Skin: No rashes or petechiae noted.  Psych: Normal affect. Lymphatics: No cervical, calvicular, axillary or inguinal LAD.   LAB RESULTS:  Lab Results  Component Value Date   NA 137 07/18/2015   K 3.3* 07/18/2015   CL 105 07/18/2015   CO2 25 07/18/2015   GLUCOSE 132* 07/18/2015   BUN 6 07/18/2015   CREATININE 0.60 07/18/2015   CALCIUM 8.6* 07/18/2015   PROT 8.7* 07/17/2015   ALBUMIN 4.6 07/17/2015   AST 25 07/17/2015   ALT 19 07/17/2015   ALKPHOS 69 07/17/2015    BILITOT 0.4 07/17/2015   GFRNONAA >60 07/18/2015   GFRAA >60 07/18/2015    Lab Results  Component Value Date   WBC 14.5* 07/18/2015   NEUTROABS 4.8 07/01/2015   HGB 9.5* 07/18/2015   HCT 30.9* 07/18/2015   MCV 68.2* 07/18/2015   PLT 300 07/18/2015     STUDIES: Ct Abdomen Pelvis W Contrast  07/18/2015   CLINICAL DATA:  Abdomen pain, vomiting and leukocytosis.  EXAM: CT ABDOMEN AND PELVIS WITH CONTRAST  TECHNIQUE: Multidetector CT imaging of the abdomen and pelvis was performed using the standard protocol following bolus administration of intravenous contrast.  CONTRAST:  75mL OMNIPAQUE IOHEXOL 300 MG/ML  SOLN  COMPARISON:  None.  FINDINGS: The appendix is normal. Remainder the bowel is unremarkable. There are normal appearances of the liver, spleen, pancreas, adrenals and kidneys. No acute inflammatory changes are evident in the abdomen or pelvis. There is a 1.8 cm right adnexal cyst or follicle with a small volume of surrounding fluid in the adnexal region.  There is no significant abnormality in the lower chest.  IMPRESSION: Small volume free fluid in the right adnexal region, apparently associated with a 1.8 cm cyst or follicle on the right ovary. Normal appendix.   Electronically Signed   By: Ellery Plunk M.D.   On: 07/18/2015 00:42   Dg Chest Portable 1 View  06/26/2015   CLINICAL DATA:  Chest palpitations since 6 a.m.  EXAM: PORTABLE CHEST - 1 VIEW  COMPARISON:  None.  FINDINGS: Minimal left base opacity, atelectasis versus early infiltrate. Right lung is clear. Heart is normal size. No effusions or acute bony abnormality.  IMPRESSION: Minimal left base atelectasis or early infiltrate.   Electronically Signed   By: Charlett Nose M.D.   On: 06/26/2015 07:21    ASSESSMENT: Delta beta thalassemia, mild iron deficiency.  PLAN:    1. Anemia: Patient's hemoglobin electrophoresis is consistent with a delta beta thalassemia which is typically associated with anemia and a significant  microcytosis. She also was noted to have a mild iron deficiency and will return to clinic in 1 week to receive one infusion of Feraheme. The remainder of her labwork was either negative or within normal limits. Return to clinic in 3 months with repeat laboratory work and further evaluation.  Patient expressed understanding and was in agreement with this plan. She also understands that She can call clinic at any time with any questions, concerns, or complaints.    Jeralyn Ruths, MD   07/21/2015 9:07 AM

## 2015-07-22 ENCOUNTER — Encounter: Payer: Self-pay | Admitting: Family Medicine

## 2015-07-22 ENCOUNTER — Ambulatory Visit (INDEPENDENT_AMBULATORY_CARE_PROVIDER_SITE_OTHER): Payer: Federal, State, Local not specified - PPO | Admitting: Family Medicine

## 2015-07-22 VITALS — BP 116/72 | HR 108 | Temp 98.2°F | Resp 18 | Wt 116.7 lb

## 2015-07-22 DIAGNOSIS — J45909 Unspecified asthma, uncomplicated: Secondary | ICD-10-CM | POA: Diagnosis not present

## 2015-07-22 DIAGNOSIS — R1310 Dysphagia, unspecified: Secondary | ICD-10-CM | POA: Diagnosis not present

## 2015-07-22 DIAGNOSIS — R0982 Postnasal drip: Secondary | ICD-10-CM | POA: Diagnosis not present

## 2015-07-22 DIAGNOSIS — B370 Candidal stomatitis: Secondary | ICD-10-CM | POA: Insufficient documentation

## 2015-07-22 DIAGNOSIS — J309 Allergic rhinitis, unspecified: Secondary | ICD-10-CM

## 2015-07-22 DIAGNOSIS — Z8619 Personal history of other infectious and parasitic diseases: Secondary | ICD-10-CM

## 2015-07-22 DIAGNOSIS — D562 Delta-beta thalassemia: Secondary | ICD-10-CM | POA: Insufficient documentation

## 2015-07-22 DIAGNOSIS — D569 Thalassemia, unspecified: Secondary | ICD-10-CM

## 2015-07-22 DIAGNOSIS — Z8719 Personal history of other diseases of the digestive system: Secondary | ICD-10-CM | POA: Diagnosis not present

## 2015-07-22 LAB — POCT RAPID STREP A (OFFICE): RAPID STREP A SCREEN: NEGATIVE

## 2015-07-22 NOTE — Progress Notes (Signed)
Name: Jennifer Byrd   MRN: [id number]    DOB: 01-29-1996   Date:07/22/2015       Progress Note  Subjective  Chief Complaint  Chief Complaint  Patient presents with  . Establish Care    patient has some questions about the medication she was placed on. Patient states that the symptoms has improved.    HPI  Patient is here today with concerns regarding the following symptoms sore throat that started weeks ago which have improved. Not associated with productive cough, fevers, sinus pressure, fatigue, rash, sick contacts. She has been prescribed 3 different kinds of antibiotics and developed C. Diff colitis recently hospitalized. She is now on Metronidazole 3x/day for this. Notes that BMs have improved significantly now on medication and probiotic and PPI. Found to be anemic at this time of hospitalization and referred to Hematology who have done studies and diagnosed her with Thalassemia. Even more recently she went to an urgent care and was again given Azithromycin.    Past Medical History  Diagnosis Date  . Acid reflux   . Strep throat   . Heart palpitations     History  Substance Use Topics  . Smoking status: Never Smoker   . Smokeless tobacco: Not on file  . Alcohol Use: No     Current outpatient prescriptions:  .  azithromycin (ZITHROMAX) 250 MG tablet, Take by mouth., Disp: , Rfl:  .  ferrous sulfate 220 (44 FE) MG/5ML solution, Take 5 mLs (220 mg total) by mouth 2 (two) times daily with a meal., Disp: 150 mL, Rfl: 3 .  metroNIDAZOLE (FLAGYL) 500 MG tablet, Take 1 tablet (500 mg total) by mouth 3 (three) times daily., Disp: 13 tablet, Rfl: 39 .  omeprazole (PRILOSEC) 40 MG capsule, Take 40 mg by mouth daily. Take 30 to 60 minutes prior to evening meal., Disp: , Rfl:  .  saccharomyces boulardii (FLORASTOR) 250 MG capsule, Take 1 capsule (250 mg total) by mouth 2 (two) times daily., Disp: 30 capsule, Rfl: 0  Allergies  Allergen Reactions  . Amoxicillin Hives, Rash and  Other (See Comments)    Patient states it makes her neck break out. Patient states it makes her neck break out.  [name] Amoxicillin-Pot Clavulanate Rash    ROS  10 Systems reviewed and is negative except as mentioned in HPI.   Objective  Filed Vitals:   07/22/15 1427  BP: 116/72  Pulse: 108  Temp: 98.2 F (36.8 C)  TempSrc: Oral  Resp: 18  Weight: 116 lb 11.2 oz (52.935 kg)  SpO2: 99%   Body mass index is 18.84 kg/(m^2).  Recent Results (from the past 2160 hour(s))  CBC with Differential     Status: Abnormal   Collection Time: 06/26/15  6:42 AM  Result Value Ref Range   WBC 18.4 (H) 3.6 - 11.0 K/uL   RBC 4.59 3.80 - 5.20 MIL/uL   Hemoglobin 8.6 (L) 12.0 - 16.0 g/dL   HCT 28.3 (L) 35.0 - 47.0 %   MCV 61.8 (L) 80.0 - 100.0 fL   MCH 18.7 (L) 26.0 - 34.0 pg   MCHC 30.3 (L) 32.0 - 36.0 g/dL   RDW 20.7 (H) 11.5 - 14.5 %   Platelets 160 150 - 440 K/uL    Comment: PLATELET COUNT CONFIRMED BY SMEAR   Neutrophils Relative % 86% %   Neutro Abs 15.7 (H) 1.4 - 6.5 K/uL   Lymphocytes Relative 5% %   Lymphs Abs 1.0 1.0 - 3.6 K/uL  Monocytes Relative 8% %   Monocytes Absolute 1.4 (H) 0.2 - 0.9 K/uL   Eosinophils Relative 1% %   Eosinophils Absolute 0.2 0 - 0.7 K/uL   Basophils Relative 0% %   Basophils Absolute 0.1 0 - 0.1 K/uL  Comprehensive metabolic panel     Status: Abnormal   Collection Time: 06/26/15  6:42 AM  Result Value Ref Range   Sodium 134 (L) 135 - 145 mmol/L   Potassium 3.3 (L) 3.5 - 5.1 mmol/L   Chloride 102 101 - 111 mmol/L   CO2 23 22 - 32 mmol/L   Glucose, Bld 124 (H) 65 - 99 mg/dL   BUN 10 6 - 20 mg/dL   Creatinine, Ser 0.81 0.44 - 1.00 mg/dL   Calcium 9.3 8.9 - 10.3 mg/dL   Total Protein 8.4 (H) 6.5 - 8.1 g/dL   Albumin 4.2 3.5 - 5.0 g/dL   AST 25 15 - 41 U/L   ALT 12 (L) 14 - 54 U/L   Alkaline Phosphatase 55 38 - 126 U/L   Total Bilirubin 0.3 0.3 - 1.2 mg/dL   GFR calc non Af Amer >60 >60 mL/min   GFR calc Af Amer >60 >60 mL/min    Comment:  (NOTE) The eGFR has been calculated using the CKD EPI equation. This calculation has not been validated in all clinical situations. eGFR's persistently <60 mL/min signify possible Chronic Kidney Disease.    Anion gap 9 5 - 15  TSH     Status: None   Collection Time: 06/26/15  6:42 AM  Result Value Ref Range   TSH 2.887 0.350 - 4.500 uIU/mL  T4, free     Status: None   Collection Time: 06/26/15  6:42 AM  Result Value Ref Range   Free T4 0.67 0.61 - 1.12 ng/dL  hCG, quantitative, pregnancy     Status: None   Collection Time: 06/26/15  6:42 AM  Result Value Ref Range   hCG, Beta Chain, Quant, S <1 <5 mIU/mL    Comment:          GEST. AGE      CONC.  (mIU/mL)   <=1 WEEK        5 - 50     2 WEEKS       50 - 500     3 WEEKS       100 - 10,000     4 WEEKS     1,000 - 30,000     5 WEEKS     3,500 - 115,000   6-8 WEEKS     12,000 - 270,000    12 WEEKS     15,000 - 220,000        FEMALE AND NON-PREGNANT FEMALE:     LESS THAN 5 mIU/mL   Troponin I     Status: None   Collection Time: 06/26/15  6:42 AM  Result Value Ref Range   Troponin I <0.03 <0.031 ng/mL    Comment:        NO INDICATION OF MYOCARDIAL INJURY.   Vitamin B12     Status: None   Collection Time: 06/26/15  6:42 AM  Result Value Ref Range   Vitamin B-12 674 180 - 914 pg/mL    Comment: (NOTE) This assay is not validated for testing neonatal or myeloproliferative syndrome specimens for Vitamin B12 levels. Performed at [facility]   Folate     Status: None   Collection Time: 06/26/15  6:42  AM  Result Value Ref Range   Folate 17.2 >5.9 ng/mL  Iron and TIBC     Status: Abnormal   Collection Time: 06/26/15  6:42 AM  Result Value Ref Range   Iron 23 (L) 28 - 170 ug/dL   TIBC 442 250 - 450 ug/dL   Saturation Ratios 5 (L) 10.4 - 31.8 %   UIBC 419 ug/dL  Ferritin     Status: None   Collection Time: 06/26/15  6:42 AM  Result Value Ref Range   Ferritin 11 11 - 307 ng/mL  Reticulocytes     Status: None    Collection Time: 06/26/15  6:42 AM  Result Value Ref Range   Retic Ct Pct 0.9 0.4 - 3.1 %   RBC. 4.63 3.80 - 5.20 MIL/uL   Retic Count, Manual 41.7 19.0 - 183.0 K/uL    Comment: [id number]  Basic metabolic panel     Status: Abnormal   Collection Time: 06/27/15  5:11 AM  Result Value Ref Range   Sodium 134 (L) 135 - 145 mmol/L   Potassium 3.7 3.5 - 5.1 mmol/L   Chloride 106 101 - 111 mmol/L   CO2 22 22 - 32 mmol/L   Glucose, Bld 107 (H) 65 - 99 mg/dL   BUN <5 (L) 6 - 20 mg/dL   Creatinine, Ser 0.63 0.44 - 1.00 mg/dL   Calcium 8.6 (L) 8.9 - 10.3 mg/dL   GFR calc non Af Amer >60 >60 mL/min   GFR calc Af Amer >60 >60 mL/min    Comment: (NOTE) The eGFR has been calculated using the CKD EPI equation. This calculation has not been validated in all clinical situations. eGFR's persistently <60 mL/min signify possible Chronic Kidney Disease.    Anion gap 6 5 - 15  CBC     Status: Abnormal   Collection Time: 06/27/15  5:11 AM  Result Value Ref Range   WBC 16.0 (H) 3.6 - 11.0 K/uL   RBC 4.12 3.80 - 5.20 MIL/uL   Hemoglobin 7.8 (L) 12.0 - 16.0 g/dL   HCT 25.6 (L) 35.0 - 47.0 %   MCV 62.1 (L) 80.0 - 100.0 fL   MCH 19.0 (L) 26.0 - 34.0 pg   MCHC 30.6 (L) 32.0 - 36.0 g/dL   RDW 20.9 (H) 11.5 - 14.5 %   Platelets 166 150 - 440 K/uL    Comment: PLATELET COUNT CONFIRMED BY SMEAR  Urinalysis complete, with microscopic ([facility] only)     Status: Abnormal   Collection Time: 06/27/15  1:40 PM  Result Value Ref Range   Color, Urine YELLOW (A) YELLOW   APPearance CLEAR (A) CLEAR   Glucose, UA NEGATIVE NEGATIVE mg/dL   Bilirubin Urine NEGATIVE NEGATIVE   Ketones, ur NEGATIVE NEGATIVE mg/dL   Specific Gravity, Urine 1.012 1.005 - 1.030   Hgb urine dipstick NEGATIVE NEGATIVE   pH 6.0 5.0 - 8.0   Protein, ur 100 (A) NEGATIVE mg/dL   Nitrite NEGATIVE NEGATIVE   Leukocytes, UA NEGATIVE NEGATIVE   RBC / HPF 0-5 0 - 5 RBC/hpf   WBC, UA 0-5 0 - 5 WBC/hpf   Bacteria, UA NONE SEEN NONE SEEN   Squamous  Epithelial / LPF 0-5 (A) NONE SEEN  CBC with Differential     Status: Abnormal   Collection Time: 07/01/15  2:56 PM  Result Value Ref Range   WBC 7.5 3.6 - 11.0 K/uL   RBC 4.76 3.80 - 5.20 MIL/uL   Hemoglobin 9.2 (L) 12.0 -  16.0 g/dL   HCT 29.9 (L) 35.0 - 47.0 %   MCV 62.9 (L) 80.0 - 100.0 fL   MCH 19.4 (L) 26.0 - 34.0 pg   MCHC 30.8 (L) 32.0 - 36.0 g/dL   RDW 22.0 (H) 11.5 - 14.5 %   Platelets 253 150 - 440 K/uL   Neutrophils Relative % 64 %   Neutro Abs 4.8 1.4 - 6.5 K/uL   Lymphocytes Relative 20 %   Lymphs Abs 1.5 1.0 - 3.6 K/uL   Monocytes Relative 10 %   Monocytes Absolute 0.8 0.2 - 0.9 K/uL   Eosinophils Relative 5 %   Eosinophils Absolute 0.3 0 - 0.7 K/uL   Basophils Relative 1 %   Basophils Absolute 0.1 0 - 0.1 K/uL  Comprehensive metabolic panel     Status: Abnormal   Collection Time: 07/01/15  2:56 PM  Result Value Ref Range   Sodium 139 135 - 145 mmol/L   Potassium 3.6 3.5 - 5.1 mmol/L   Chloride 103 101 - 111 mmol/L   CO2 28 22 - 32 mmol/L   Glucose, Bld 98 65 - 99 mg/dL   BUN 9 6 - 20 mg/dL   Creatinine, Ser 0.66 0.44 - 1.00 mg/dL   Calcium 9.4 8.9 - 10.3 mg/dL   Total Protein 7.9 6.5 - 8.1 g/dL   Albumin 4.2 3.5 - 5.0 g/dL   AST 20 15 - 41 U/L   ALT 17 14 - 54 U/L   Alkaline Phosphatase 69 38 - 126 U/L   Total Bilirubin 0.2 (L) 0.3 - 1.2 mg/dL   GFR calc non Af Amer >60 >60 mL/min   GFR calc Af Amer >60 >60 mL/min    Comment: (NOTE) The eGFR has been calculated using the CKD EPI equation. This calculation has not been validated in all clinical situations. eGFR's persistently <60 mL/min signify possible Chronic Kidney Disease.    Anion gap 8 5 - 15  Urinalysis complete, with microscopic     Status: Abnormal   Collection Time: 07/01/15  5:34 PM  Result Value Ref Range   Color, Urine YELLOW (A) YELLOW   APPearance CLEAR (A) CLEAR   Glucose, UA NEGATIVE NEGATIVE mg/dL   Bilirubin Urine NEGATIVE NEGATIVE   Ketones, ur NEGATIVE NEGATIVE mg/dL    Specific Gravity, Urine 1.017 1.005 - 1.030   Hgb urine dipstick 2+ (A) NEGATIVE   pH 7.0 5.0 - 8.0   Protein, ur 100 (A) NEGATIVE mg/dL   Nitrite NEGATIVE NEGATIVE   Leukocytes, UA NEGATIVE NEGATIVE   RBC / HPF TOO NUMEROUS TO COUNT 0 - 5 RBC/hpf   WBC, UA 0-5 0 - 5 WBC/hpf   Bacteria, UA NONE SEEN NONE SEEN   Squamous Epithelial / LPF 0-5 (A) NONE SEEN   Mucous PRESENT    Hyaline Casts, UA PRESENT    Amorphous Crystal PRESENT   Stool culture     Status: None   Collection Time: 07/01/15  5:34 PM  Result Value Ref Range   Specimen Description Stool    Special Requests Normal    Report Status 07/08/2015 FINAL   CBC     Status: Abnormal   Collection Time: 07/06/15 12:15 PM  Result Value Ref Range   WBC 7.9 3.6 - 11.0 K/uL   RBC 4.62 3.80 - 5.20 MIL/uL   Hemoglobin 9.0 (L) 12.0 - 16.0 g/dL   HCT 29.9 (L) 35.0 - 47.0 %   MCV 64.8 (L) 80.0 - 100.0 fL   MCH  19.5 (L) 26.0 - 34.0 pg   MCHC 30.1 (L) 32.0 - 36.0 g/dL   RDW 21.6 (H) 11.5 - 14.5 %   Platelets 267 150 - 440 K/uL  Ferritin     Status: Abnormal   Collection Time: 07/06/15 12:15 PM  Result Value Ref Range   Ferritin 615 (H) 11 - 307 ng/mL  Iron and TIBC     Status: None   Collection Time: 07/06/15 12:15 PM  Result Value Ref Range   Iron 45 28 - 170 ug/dL   TIBC 319 250 - 450 ug/dL   Saturation Ratios 14 10.4 - 31.8 %   UIBC 274 ug/dL  Vitamin B12     Status: None   Collection Time: 07/06/15 12:15 PM  Result Value Ref Range   Vitamin B-12 818 180 - 914 pg/mL    Comment: (NOTE) This assay is not validated for testing neonatal or myeloproliferative syndrome specimens for Vitamin B12 levels. 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 Suggest hematologic and clinical correlation. Performed At: [facility] [place], [place] [id number] Lindon Romp MD AY:[id number]   Lipase, blood     Status: Abnormal   Collection Time: 07/17/15  8:45 PM  Result Value Ref Range   Lipase 19 (L) 22 - 51 U/L  Comprehensive metabolic panel     Status: Abnormal   Collection Time: 07/17/15  8:45 PM  Result Value Ref Range   Sodium 135 135 - 145 mmol/L   Potassium 3.1 (L) 3.5 - 5.1 mmol/L   Chloride 98 (L) 101 - 111 mmol/L   CO2 27 22 - 32 mmol/L   Glucose, Bld 140 (H) 65 - 99 mg/dL   BUN 7 6 - 20 mg/dL   Creatinine, Ser 0.82 0.44 - 1.00 mg/dL   Calcium 10.0 8.9 - 10.3 mg/dL   Total Protein 8.7 (H) 6.5 - 8.1 g/dL   Albumin 4.6 3.5 - 5.0 g/dL   AST 25 15 - 41 U/L   ALT 19 14 - 54 U/L   Alkaline Phosphatase 69 38 - 126 U/L   Total Bilirubin 0.4 0.3 - 1.2 mg/dL   GFR calc non Af Amer >60 >60 mL/min   GFR calc Af Amer >60 >60 mL/min    Comment: (NOTE) The eGFR has been calculated using the CKD EPI equation. This calculation has not been validated in all clinical situations. eGFR's persistently <60 mL/min signify possible Chronic Kidney Disease.    Anion gap 10 5 - 15  CBC     Status: Abnormal   Collection Time: 07/17/15  8:45 PM  Result Value Ref Range   WBC 19.4 (H) 3.6 -  11.0 K/uL   RBC 5.41 (H) 3.80 - 5.20 MIL/uL   Hemoglobin 11.4 (L) 12.0 - 16.0 g/dL   HCT 36.7 35.0 - 47.0 %   MCV 67.8 (L) 80.0 - 100.0 fL   MCH 21.1 (L) 26.0 - 34.0 pg   MCHC 31.1 (L) 32.0 - 36.0 g/dL   RDW 28.8 (H) 11.5 - 14.5 %   Platelets 354 150 - 440 K/uL  Urinalysis complete, with microscopic ([facility]  only)     Status: Abnormal   Collection Time: 07/17/15  8:45 PM  Result Value Ref Range   Color, Urine YELLOW (A) YELLOW   APPearance HAZY (A) CLEAR   Glucose, UA NEGATIVE NEGATIVE mg/dL   Bilirubin Urine NEGATIVE NEGATIVE   Ketones, ur TRACE (A) NEGATIVE mg/dL   Specific Gravity, Urine 1.024 1.005 - 1.030   Hgb urine dipstick NEGATIVE NEGATIVE   pH 5.0 5.0 - 8.0   Protein, ur 100 (A) NEGATIVE mg/dL   Nitrite NEGATIVE NEGATIVE   Leukocytes, UA NEGATIVE NEGATIVE   RBC / HPF NONE SEEN 0 - 5 RBC/hpf   WBC, UA 0-5 0 - 5 WBC/hpf   Bacteria, UA NONE SEEN NONE SEEN   Squamous Epithelial / LPF 6-30 (A) NONE SEEN   Mucous PRESENT    Hyaline Casts, UA PRESENT   Pregnancy, urine     Status: None   Collection Time: 07/17/15  8:45 PM  Result Value Ref Range   Preg Test, Ur NEGATIVE NEGATIVE  C difficile quick scan w PCR reflex ([facility] only)     Status: Abnormal   Collection Time: 07/17/15  9:01 PM  Result Value Ref Range   C Diff antigen POSITIVE (A) NEGATIVE   C Diff toxin NEGATIVE NEGATIVE   C Diff interpretation      Positive for toxigenic C. difficile, active toxin production not detected. Patient has toxigenic C. difficile organisms present in the bowel, but toxin was not detected. The patient may be a carrier or the level of toxin in the sample was below the limit  of detection. This information should be used in conjunction with the patient's clinical history when deciding on possible therapy.     Comment: CRITICAL RESULT CALLED TO, READ BACK BY AND VERIFIED WITH: APRIL BUMGARDENER @ [phone] MPG   Stool culture     Status: None   Collection Time: 07/17/15  9:01 PM  Result Value Ref Range   Specimen Description STOOL    Special Requests Normal    Culture      NO SALMONELLA OR SHIGELLA ISOLATED No Pathogenic E. coli detected NO CAMPYLOBACTER DETECTED    Report Status 07/20/2015 FINAL   Clostridium Difficile by PCR (not at [facility])     Status: Abnormal   Collection Time:  07/17/15  9:01 PM  Result Value Ref Range   C difficile by pcr POSITIVE (A) NEGATIVE  CBC     Status: Abnormal   Collection Time: 07/18/15  6:24 AM  Result Value Ref Range   WBC 14.5 (H) 3.6 - 11.0 K/uL   RBC 4.53 3.80 - 5.20 MIL/uL   Hemoglobin 9.5 (L) 12.0 - 16.0 g/dL   HCT 30.9 (L) 35.0 - 47.0 %   MCV 68.2 (L) 80.0 - 100.0 fL   MCH 21.0 (L) 26.0 - 34.0 pg   MCHC 30.8 (L) 32.0 - 36.0 g/dL   RDW 29.8 (H) 11.5 - 14.5 %   Platelets 300 150 - 440 K/uL  Basic metabolic panel  Status: Abnormal   Collection Time: 07/18/15  6:24 AM  Result Value Ref Range   Sodium 137 135 - 145 mmol/L   Potassium 3.3 (L) 3.5 - 5.1 mmol/L   Chloride 105 101 - 111 mmol/L   CO2 25 22 - 32 mmol/L   Glucose, Bld 132 (H) 65 - 99 mg/dL   BUN 6 6 - 20 mg/dL   Creatinine, Ser 0.60 0.44 - 1.00 mg/dL   Calcium 8.6 (L) 8.9 - 10.3 mg/dL   GFR calc non Af Amer >60 >60 mL/min   GFR calc Af Amer >60 >60 mL/min    Comment: (NOTE) The eGFR has been calculated using the CKD EPI equation. This calculation has not been validated in all clinical situations. eGFR's persistently <60 mL/min signify possible Chronic Kidney Disease.    Anion gap 7 5 - 15  POCT rapid strep A     Status: Normal   Collection Time: 07/22/15  3:10 PM  Result Value Ref Range   Rapid Strep A Screen Negative Negative     Physical Exam  Constitutional: Patient appears well-developed and well-nourished. In no distress.  HEENT:  - Head: Normocephalic and atraumatic.  - Ears: Bilateral TMs gray, no erythema or effusion - Nose: Nasal mucosa moist - Mouth/Throat: Oropharynx is clear and moist. No tonsillar hypertrophy or erythema. No post nasal drainage.  - Eyes: Conjunctivae clear, EOM movements normal. PERRLA. No scleral icterus.  Neck: Normal range of motion. Neck supple. No JVD present. No thyromegaly present.  Cardiovascular: Normal rate, regular rhythm and normal heart sounds.  No murmur heard.  Pulmonary/Chest: Effort normal and  breath sounds normal. No respiratory distress. Musculoskeletal: Normal range of motion bilateral UE and LE, no joint effusions. Peripheral vascular: Bilateral LE no edema. Neurological: CN II-XII grossly intact with no focal deficits. Alert and oriented to person, place, and time. Coordination, balance, strength, speech and gait are normal.  Skin: Skin is warm and dry. No rash noted. No erythema.  Psychiatric: Patient has a normal mood and affect. Behavior is normal in office today. Judgment and thought content normal in office today.   Assessment & Plan  1. Pain with swallowing This discomfort is only with swallowing her Metronidazole. She tends to have a hard time with swallowing pills or capsules. She notes no choking or pain with swallowing food or drinks. Has been seeing ENT and Allergist for some time now, scope shows nothing. Reassured patient likely it is related to her reluctance to swallow medications.  - POCT rapid strep A  2. Thalassemia, unspecified thalassemia type Reviewed Hematology notes.   3. Allergic rhinitis with postnasal drip Encouraged patient to make appointment with allergist and start back up on her allergy shots.  4. History of Clostridium difficile colitis Finish treatment course of Metronidazole, if having GI symptoms f/u for repeat stool studies. AVOID CHRONIC ANTIBIOTIC USE. Transmission precautions re enforced.

## 2015-07-22 NOTE — Patient Instructions (Signed)
Clostridium Difficile Infection °Clostridium difficile (C. difficile) is a bacteria found in the intestinal tract or colon. Under certain conditions, it causes diarrhea and sometimes severe disease. The severe form of the disease is known as pseudomembranous colitis (often called C. difficile colitis). This disease can damage the lining of the colon or cause the colon to become enlarged (toxic megacolon). °CAUSES °Your colon normally contains many different bacteria, including C. difficile. The balance of bacteria in your colon can change during illness. This is especially true when you take antibiotic medicine. Taking antibiotics may allow the C. difficile to grow, multiply excessively, and make a toxin that then causes illness. The elderly and people with certain medical conditions have a greater risk of getting C. difficile infections. °SYMPTOMS °· Watery diarrhea. °· Fever. °· Fatigue. °· Loss of appetite. °· Nausea. °· Abdominal swelling, pain, or tenderness. °· Dehydration. °DIAGNOSIS °Your symptoms may make your caregiver suspect a C. difficile infection, especially if you have used antibiotics in the preceding weeks. However, there are only 2 ways to know for certain whether you have a C. difficile infection: °· A lab test that finds the toxin in your stool. °· The specific appearance of an abnormality (pseudomembrane) in your colon. This can only be seen by doing a sigmoidoscopy or colonoscopy. These procedures involve passing an instrument through your rectum to look at the inside of your colon. °Your caregiver will help determine if these tests are necessary. °TREATMENT °· Most people are successfully treated with one of two specific antibiotics, usually given by mouth. Other antibiotics you are receiving are stopped if possible. °· Intravenous (IV) fluids and correction of electrolyte imbalance may be necessary. °· Rarely, surgery may be needed to remove the infected part of the intestines. °· Careful  hand washing by you and your caregivers is important to prevent the spread of infection. In the hospital, your caregivers may also put on gowns and gloves to prevent the spread of the C. difficile bacteria. Your room is also cleaned regularly with a solution containing bleach or a product that is known to kill C. difficile. °HOME CARE INSTRUCTIONS °· Drink enough fluids to keep your urine clear or pale yellow. Avoid milk, caffeine, and alcohol. °· Ask your caregiver for specific rehydration instructions. °· Try eating small, frequent meals rather than large meals. °· Take your antibiotics as directed. Finish them even if you start to feel better. °· Do not use medicines to slow diarrhea. This could delay healing or cause complications. °· Wash your hands thoroughly after using the bathroom and before preparing food. °· Make sure people who live with you wash their hands often, too. °· Carefully disinfect all surfaces with a product that contains chlorine bleach. °SEEK MEDICAL CARE IF: °· Diarrhea persists longer than expected or recurs after completing your course of antibiotic treatment for the C. difficile infection. °· You have trouble staying hydrated. °SEEK IMMEDIATE MEDICAL CARE IF: °· You develop a new fever. °· You have increasing abdominal pain or tenderness. °· There is blood in your stools, or your stools are dark black and tarry. °· You cannot hold down food or liquids. °MAKE SURE YOU: °· Understand these instructions. °· Will watch your condition. °· Will get help right away if you are not doing well or get worse. °Document Released: 09/19/2005 Document Revised: 04/26/2014 Document Reviewed: 05/18/2011 °[employer]® Patient Information ©2015 [employer]. This information is not intended to replace advice given to you by your health care provider. Make sure you   discuss any questions you have with your health care provider. ° °

## 2015-07-25 ENCOUNTER — Telehealth: Payer: Self-pay | Admitting: Family Medicine

## 2015-07-25 NOTE — Telephone Encounter (Signed)
Will examine the area whenever we do a physical. Use Miralax OTC for constipation daily adjusting dose so that she achieves a bowel movement every 1-2 days to see if this helps with her symptoms.

## 2015-07-25 NOTE — Telephone Encounter (Signed)
Patient stated that she forgot to tell Dr. Sherley Bounds during her office visit on Friday that she has been constipated and when she wipes she feels something. She is not sure if it is hemorrhoids or what. I informed her that I would relay this message and will give he a call back.

## 2015-07-25 NOTE — Telephone Encounter (Signed)
Contacted patient to review the information below and to make sure she gets an appt for her annual physical so that area of concern and be examined.

## 2015-07-25 NOTE — Telephone Encounter (Signed)
Requesting return call concerning digestive system

## 2015-07-26 ENCOUNTER — Telehealth: Payer: Self-pay | Admitting: Family Medicine

## 2015-07-26 NOTE — Telephone Encounter (Signed)
ERRENOUS °

## 2015-07-26 NOTE — Telephone Encounter (Signed)
PT IS HAVING BLEEDING WHEN HAVING BOWEL MOVEMENT. WANTS SOMEONE TO CALL HER BACK WITH [facility]. TRIED TO GIVE HER AN APPT BUT SAID COULD NOT COME IN TOMORROW AND WANTED TO KNOW WHAT TO DO.

## 2015-07-27 NOTE — Telephone Encounter (Signed)
Tried to contact this patient to review the message below, but there was no answer.  A message was left for her to give [place] a call when she got the chance.

## 2015-07-27 NOTE — Telephone Encounter (Signed)
Patient returned my call and Dr. Debby Freiberg recommendations were reviewed. Patient stated that the Miralax made her feel funny so I suggested that she try something like Metamucil or Ducolax and she said ok. Patient was encouraged to give [place] a call by Friday morning if her sx persists and she said ok.

## 2015-07-27 NOTE — Telephone Encounter (Signed)
She just had C.Diff colitis (inflammation of the colon) which is likely causing some bleeding with her stools, or otherwise may be hemorrhoids. Either way there is nothing else that can be done other than what she is already doing: constipation medications, stool softners, hemorrhoid creams, finish medication for C.Diff infection. If despite all that it is still going on she can follow up in clinic. Do not wait for last minute appointment.

## 2015-08-01 ENCOUNTER — Ambulatory Visit (INDEPENDENT_AMBULATORY_CARE_PROVIDER_SITE_OTHER): Payer: Federal, State, Local not specified - PPO | Admitting: Family Medicine

## 2015-08-01 ENCOUNTER — Encounter: Payer: Self-pay | Admitting: Family Medicine

## 2015-08-01 VITALS — BP 100/70 | HR 85 | Temp 99.0°F | Resp 16 | Ht 66.0 in | Wt 120.1 lb

## 2015-08-01 DIAGNOSIS — K5900 Constipation, unspecified: Secondary | ICD-10-CM | POA: Diagnosis not present

## 2015-08-01 DIAGNOSIS — K5909 Other constipation: Secondary | ICD-10-CM

## 2015-08-01 DIAGNOSIS — B370 Candidal stomatitis: Secondary | ICD-10-CM

## 2015-08-01 MED ORDER — NYSTATIN 100000 UNIT/ML MT SUSP: 5.0000 mL | Freq: Four times a day (QID) | OROMUCOSAL | Status: DC

## 2015-08-01 NOTE — Progress Notes (Signed)
Name: Jennifer Byrd   MRN: [id number]    DOB: December 21, 1996   Date:08/01/2015       Progress Note  Subjective  Chief Complaint  Chief Complaint  Patient presents with  . Thrush    1st noticed on Saturday night    HPI  Jennifer Byrd is a 19 year old female with recent C.diff colitis due to frequent antibiotic use. Finished treatment course for C.diff and notes resolved rectal bleeding with BMs, but still having constipation issues which is a long term problem. 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 She tried prune juice and miralax as recommended for constipation but it gave her headaches so she discontinued it. She also recently noted white patches in her mouth. Sore throat improved with starting back on her allergy medications.  Not associated with oral lesion, sores, fevers, coughing.  Patient Active Problem List   Diagnosis Date Noted  . 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 Pharyngitis 07/22/2015  . Allergic rhinitis with postnasal drip 07/22/2015  . History of Clostridium difficile colitis 07/18/2015  . Iron deficiency anemia 07/06/2015  . Palpitations 06/26/2015  . Acute urticaria 11/17/2014    History  Substance Use Topics  . Smoking status: Never Smoker   . Smokeless tobacco: Not on file  . Alcohol Use: No     Current outpatient prescriptions:  .  ferrous sulfate 220 (44 FE) MG/5ML solution, Take 5 mLs (220 mg total) by mouth 2 (two) times daily with a meal., Disp: 150 mL, Rfl: 3 .  metroNIDAZOLE (FLAGYL) 500 MG tablet, Take 500 mg by mouth 3 (three) times daily., Disp: , Rfl: 0 .  omeprazole (PRILOSEC) 40 MG capsule, Take 40 mg by mouth daily. Take 30 to 60 minutes prior to evening meal., Disp: , Rfl:  .  saccharomyces boulardii (FLORASTOR) 250 MG capsule, Take 1 capsule (250 mg total) by mouth 2 (two) times daily., Disp: 30 capsule, Rfl: 0  History reviewed. No pertinent past surgical history.  Family History  Problem Relation Age of Onset   . Diabetes Mother   . Hypothyroidism Mother   . Diabetes Father   . Hypertension Father     Allergies  Allergen Reactions  . Amoxicillin Hives, Rash and Other (See Comments)    Patient states it makes her neck break out. Patient states it makes her neck break out.  [name] Amoxicillin-Pot Clavulanate Rash     Review of Systems  CONSTITUTIONAL: No significant weight changes, fever, chills, weakness or fatigue.  HEENT:  - Eyes: No visual changes.  - Ears: No auditory changes. No pain.  - Nose: No sneezing, congestion, runny nose. - Throat: No sore throat. No changes in swallowing. SKIN: No rash or itching.  CARDIOVASCULAR: No chest pain, chest pressure or chest discomfort. No palpitations or edema.  RESPIRATORY: No shortness of breath, cough or sputum.  GASTROINTESTINAL: No anorexia, nausea, vomiting. No changes in bowel habits. No abdominal pain or blood.  GENITOURINARY: No dysuria. No frequency. No discharge.  NEUROLOGICAL: No headache, dizziness, syncope, paralysis, ataxia, numbness or tingling in the extremities. No memory changes. No change in bowel or bladder control.  MUSCULOSKELETAL: No joint pain. No muscle pain. HEMATOLOGIC: No anemia, bleeding or bruising.  LYMPHATICS: No enlarged lymph nodes.  PSYCHIATRIC: No change in mood. No change in sleep pattern.  ENDOCRINOLOGIC: No reports of sweating, cold or heat intolerance. No polyuria or polydipsia.     Objective  BP 100/70 mmHg  Pulse 85  Temp(Src) 99  F (37.2 C) (Oral)  Resp 16  Ht 5' 6"  (1.676 m)  Wt 120 lb 1.6 oz (54.477 kg)  BMI 19.39 kg/m2  SpO2 97%  LMP 07/11/2015 (Approximate) Body mass index is 19.39 kg/(m^2).  Physical Exam  Constitutional: Patient appears well-developed and well-nourished. In no distress.  HEENT:  - Head: Normocephalic and atraumatic.  - Ears: Bilateral TMs gray, no erythema or effusion - Nose: Nasal mucosa moist - Mouth/Throat: Oropharynx is moist with scattered white patches on  tongue and cheeks. No tonsillar hypertrophy or erythema. No post nasal drainage.  - Eyes: Conjunctivae clear, EOM movements normal. PERRLA. No scleral icterus.  Neck: Normal range of motion. Neck supple. No JVD present. No thyromegaly present.  Cardiovascular: Normal rate, regular rhythm and normal heart sounds.  No murmur heard.  Pulmonary/Chest: Effort normal and breath sounds normal. No respiratory distress. Musculoskeletal: Normal range of motion bilateral UE and LE, no joint effusions. Peripheral vascular: Bilateral LE no edema. Neurological: CN II-XII grossly intact with no focal deficits. Alert and oriented to person, place, and time. Coordination, balance, strength, speech and gait are normal.  Skin: Skin is warm and dry. No rash noted. No erythema.  Psychiatric: Patient has a normal mood and affect. Behavior is normal in office today. Judgment and thought content normal in office today.   Recent Results (from the past 2160 hour(s))  CBC with Differential     Status: Abnormal   Collection Time: 06/26/15  6:42 AM  Result Value Ref Range   WBC 18.4 (H) 3.6 - 11.0 K/uL   RBC 4.59 3.80 - 5.20 MIL/uL   Hemoglobin 8.6 (L) 12.0 - 16.0 g/dL   HCT 28.3 (L) 35.0 - 47.0 %   MCV 61.8 (L) 80.0 - 100.0 fL   MCH 18.7 (L) 26.0 - 34.0 pg   MCHC 30.3 (L) 32.0 - 36.0 g/dL   RDW 20.7 (H) 11.5 - 14.5 %   Platelets 160 150 - 440 K/uL    Comment: PLATELET COUNT CONFIRMED BY SMEAR   Neutrophils Relative % 86% %   Neutro Abs 15.7 (H) 1.4 - 6.5 K/uL   Lymphocytes Relative 5% %   Lymphs Abs 1.0 1.0 - 3.6 K/uL   Monocytes Relative 8% %   Monocytes Absolute 1.4 (H) 0.2 - 0.9 K/uL   Eosinophils Relative 1% %   Eosinophils Absolute 0.2 0 - 0.7 K/uL   Basophils Relative 0% %   Basophils Absolute 0.1 0 - 0.1 K/uL  Comprehensive metabolic panel     Status: Abnormal   Collection Time: 06/26/15  6:42 AM  Result Value Ref Range   Sodium 134 (L) 135 - 145 mmol/L   Potassium 3.3 (L) 3.5 - 5.1 mmol/L    Chloride 102 101 - 111 mmol/L   CO2 23 22 - 32 mmol/L   Glucose, Bld 124 (H) 65 - 99 mg/dL   BUN 10 6 - 20 mg/dL   Creatinine, Ser 0.81 0.44 - 1.00 mg/dL   Calcium 9.3 8.9 - 10.3 mg/dL   Total Protein 8.4 (H) 6.5 - 8.1 g/dL   Albumin 4.2 3.5 - 5.0 g/dL   AST 25 15 - 41 U/L   ALT 12 (L) 14 - 54 U/L   Alkaline Phosphatase 55 38 - 126 U/L   Total Bilirubin 0.3 0.3 - 1.2 mg/dL   GFR calc non Af Amer >60 >60 mL/min   GFR calc Af Amer >60 >60 mL/min    Comment: (NOTE) The eGFR has been calculated using  the CKD EPI equation. This calculation has not been validated in all clinical situations. eGFR's persistently <60 mL/min signify possible Chronic Kidney Disease.    Anion gap 9 5 - 15  TSH     Status: None   Collection Time: 06/26/15  6:42 AM  Result Value Ref Range   TSH 2.887 0.350 - 4.500 uIU/mL  T4, free     Status: None   Collection Time: 06/26/15  6:42 AM  Result Value Ref Range   Free T4 0.67 0.61 - 1.12 ng/dL  hCG, quantitative, pregnancy     Status: None   Collection Time: 06/26/15  6:42 AM  Result Value Ref Range   hCG, Beta Chain, Quant, S <1 <5 mIU/mL    Comment:          GEST. AGE      CONC.  (mIU/mL)   <=1 WEEK        5 - 50     2 WEEKS       50 - 500     3 WEEKS       100 - 10,000     4 WEEKS     1,000 - 30,000     5 WEEKS     3,500 - 115,000   6-8 WEEKS     12,000 - 270,000    12 WEEKS     15,000 - 220,000        FEMALE AND NON-PREGNANT FEMALE:     LESS THAN 5 mIU/mL   Troponin I     Status: None   Collection Time: 06/26/15  6:42 AM  Result Value Ref Range   Troponin I <0.03 <0.031 ng/mL    Comment:        NO INDICATION OF MYOCARDIAL INJURY.   Vitamin B12     Status: None   Collection Time: 06/26/15  6:42 AM  Result Value Ref Range   Vitamin B-12 674 180 - 914 pg/mL    Comment: (NOTE) This assay is not validated for testing neonatal or myeloproliferative syndrome specimens for Vitamin B12 levels. Performed at [facility]   Folate      Status: None   Collection Time: 06/26/15  6:42 AM  Result Value Ref Range   Folate 17.2 >5.9 ng/mL  Iron and TIBC     Status: Abnormal   Collection Time: 06/26/15  6:42 AM  Result Value Ref Range   Iron 23 (L) 28 - 170 ug/dL   TIBC 442 250 - 450 ug/dL   Saturation Ratios 5 (L) 10.4 - 31.8 %   UIBC 419 ug/dL  Ferritin     Status: None   Collection Time: 06/26/15  6:42 AM  Result Value Ref Range   Ferritin 11 11 - 307 ng/mL  Reticulocytes     Status: None   Collection Time: 06/26/15  6:42 AM  Result Value Ref Range   Retic Ct Pct 0.9 0.4 - 3.1 %   RBC. 4.63 3.80 - 5.20 MIL/uL   Retic Count, Manual 41.7 19.0 - 183.0 K/uL    Comment: 0.9323  Basic metabolic panel     Status: Abnormal   Collection Time: 06/27/15  5:11 AM  Result Value Ref Range   Sodium 134 (L) 135 - 145 mmol/L   Potassium 3.7 3.5 - 5.1 mmol/L   Chloride 106 101 - 111 mmol/L   CO2 22 22 - 32 mmol/L   Glucose, Bld 107 (H) 65 - 99 mg/dL   BUN <  5 (L) 6 - 20 mg/dL   Creatinine, Ser 0.63 0.44 - 1.00 mg/dL   Calcium 8.6 (L) 8.9 - 10.3 mg/dL   GFR calc non Af Amer >60 >60 mL/min   GFR calc Af Amer >60 >60 mL/min    Comment: (NOTE) The eGFR has been calculated using the CKD EPI equation. This calculation has not been validated in all clinical situations. eGFR's persistently <60 mL/min signify possible Chronic Kidney Disease.    Anion gap 6 5 - 15  CBC     Status: Abnormal   Collection Time: 06/27/15  5:11 AM  Result Value Ref Range   WBC 16.0 (H) 3.6 - 11.0 K/uL   RBC 4.12 3.80 - 5.20 MIL/uL   Hemoglobin 7.8 (L) 12.0 - 16.0 g/dL   HCT 25.6 (L) 35.0 - 47.0 %   MCV 62.1 (L) 80.0 - 100.0 fL   MCH 19.0 (L) 26.0 - 34.0 pg   MCHC 30.6 (L) 32.0 - 36.0 g/dL   RDW 20.9 (H) 11.5 - 14.5 %   Platelets 166 150 - 440 K/uL    Comment: PLATELET COUNT CONFIRMED BY SMEAR  Urinalysis complete, with microscopic ([facility] only)     Status: Abnormal   Collection Time: 06/27/15  1:40 PM  Result Value Ref Range   Color, Urine YELLOW  (A) YELLOW   APPearance CLEAR (A) CLEAR   Glucose, UA NEGATIVE NEGATIVE mg/dL   Bilirubin Urine NEGATIVE NEGATIVE   Ketones, ur NEGATIVE NEGATIVE mg/dL   Specific Gravity, Urine 1.012 1.005 - 1.030   Hgb urine dipstick NEGATIVE NEGATIVE   pH 6.0 5.0 - 8.0   Protein, ur 100 (A) NEGATIVE mg/dL   Nitrite NEGATIVE NEGATIVE   Leukocytes, UA NEGATIVE NEGATIVE   RBC / HPF 0-5 0 - 5 RBC/hpf   WBC, UA 0-5 0 - 5 WBC/hpf   Bacteria, UA NONE SEEN NONE SEEN   Squamous Epithelial / LPF 0-5 (A) NONE SEEN  CBC with Differential     Status: Abnormal   Collection Time: 07/01/15  2:56 PM  Result Value Ref Range   WBC 7.5 3.6 - 11.0 K/uL   RBC 4.76 3.80 - 5.20 MIL/uL   Hemoglobin 9.2 (L) 12.0 - 16.0 g/dL   HCT 29.9 (L) 35.0 - 47.0 %   MCV 62.9 (L) 80.0 - 100.0 fL   MCH 19.4 (L) 26.0 - 34.0 pg   MCHC 30.8 (L) 32.0 - 36.0 g/dL   RDW 22.0 (H) 11.5 - 14.5 %   Platelets 253 150 - 440 K/uL   Neutrophils Relative % 64 %   Neutro Abs 4.8 1.4 - 6.5 K/uL   Lymphocytes Relative 20 %   Lymphs Abs 1.5 1.0 - 3.6 K/uL   Monocytes Relative 10 %   Monocytes Absolute 0.8 0.2 - 0.9 K/uL   Eosinophils Relative 5 %   Eosinophils Absolute 0.3 0 - 0.7 K/uL   Basophils Relative 1 %   Basophils Absolute 0.1 0 - 0.1 K/uL  Comprehensive metabolic panel     Status: Abnormal   Collection Time: 07/01/15  2:56 PM  Result Value Ref Range   Sodium 139 135 - 145 mmol/L   Potassium 3.6 3.5 - 5.1 mmol/L   Chloride 103 101 - 111 mmol/L   CO2 28 22 - 32 mmol/L   Glucose, Bld 98 65 - 99 mg/dL   BUN 9 6 - 20 mg/dL   Creatinine, Ser 0.66 0.44 - 1.00 mg/dL   Calcium 9.4 8.9 - 10.3 mg/dL  Total Protein 7.9 6.5 - 8.1 g/dL   Albumin 4.2 3.5 - 5.0 g/dL   AST 20 15 - 41 U/L   ALT 17 14 - 54 U/L   Alkaline Phosphatase 69 38 - 126 U/L   Total Bilirubin 0.2 (L) 0.3 - 1.2 mg/dL   GFR calc non Af Amer >60 >60 mL/min   GFR calc Af Amer >60 >60 mL/min    Comment: (NOTE) The eGFR has been calculated using the CKD EPI equation. This  calculation has not been validated in all clinical situations. eGFR's persistently <60 mL/min signify possible Chronic Kidney Disease.    Anion gap 8 5 - 15  Urinalysis complete, with microscopic     Status: Abnormal   Collection Time: 07/01/15  5:34 PM  Result Value Ref Range   Color, Urine YELLOW (A) YELLOW   APPearance CLEAR (A) CLEAR   Glucose, UA NEGATIVE NEGATIVE mg/dL   Bilirubin Urine NEGATIVE NEGATIVE   Ketones, ur NEGATIVE NEGATIVE mg/dL   Specific Gravity, Urine 1.017 1.005 - 1.030   Hgb urine dipstick 2+ (A) NEGATIVE   pH 7.0 5.0 - 8.0   Protein, ur 100 (A) NEGATIVE mg/dL   Nitrite NEGATIVE NEGATIVE   Leukocytes, UA NEGATIVE NEGATIVE   RBC / HPF TOO NUMEROUS TO COUNT 0 - 5 RBC/hpf   WBC, UA 0-5 0 - 5 WBC/hpf   Bacteria, UA NONE SEEN NONE SEEN   Squamous Epithelial / LPF 0-5 (A) NONE SEEN   Mucous PRESENT    Hyaline Casts, UA PRESENT    Amorphous Crystal PRESENT   Stool culture     Status: None   Collection Time: 07/01/15  5:34 PM  Result Value Ref Range   Specimen Description Stool    Special Requests Normal    Report Status 07/08/2015 FINAL   CBC     Status: Abnormal   Collection Time: 07/06/15 12:15 PM  Result Value Ref Range   WBC 7.9 3.6 - 11.0 K/uL   RBC 4.62 3.80 - 5.20 MIL/uL   Hemoglobin 9.0 (L) 12.0 - 16.0 g/dL   HCT 29.9 (L) 35.0 - 47.0 %   MCV 64.8 (L) 80.0 - 100.0 fL   MCH 19.5 (L) 26.0 - 34.0 pg   MCHC 30.1 (L) 32.0 - 36.0 g/dL   RDW 21.6 (H) 11.5 - 14.5 %   Platelets 267 150 - 440 K/uL  Ferritin     Status: Abnormal   Collection Time: 07/06/15 12:15 PM  Result Value Ref Range   Ferritin 615 (H) 11 - 307 ng/mL  Iron and TIBC     Status: None   Collection Time: 07/06/15 12:15 PM  Result Value Ref Range   Iron 45 28 - 170 ug/dL   TIBC 319 250 - 450 ug/dL   Saturation Ratios 14 10.4 - 31.8 %   UIBC 274 ug/dL  Vitamin B12     Status: None   Collection Time: 07/06/15 12:15 PM  Result Value Ref Range   Vitamin B-12 818 180 - 914 pg/mL     Comment: (NOTE) This assay is not validated for testing neonatal or myeloproliferative syndrome specimens for Vitamin B12 levels. 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 Suggest hematologic and clinical correlation. Performed At: [facility] [place], [place] [id number] Lindon Romp MD KZ:[id number]   Lipase, blood     Status: Abnormal   Collection Time: 07/17/15  8:45 PM  Result Value Ref Range   Lipase 19 (L) 22 - 51 U/L  Comprehensive metabolic panel     Status: Abnormal   Collection Time: 07/17/15  8:45 PM  Result Value Ref Range   Sodium 135 135 - 145 mmol/L   Potassium 3.1 (L) 3.5 - 5.1 mmol/L   Chloride 98 (L) 101 - 111 mmol/L   CO2 27 22 - 32 mmol/L   Glucose, Bld 140 (H) 65 - 99 mg/dL   BUN 7 6 - 20 mg/dL   Creatinine, Ser 0.82 0.44 - 1.00 mg/dL   Calcium 10.0 8.9 - 10.3 mg/dL   Total Protein 8.7 (H) 6.5 - 8.1 g/dL   Albumin 4.6 3.5 - 5.0 g/dL   AST 25 15 - 41 U/L   ALT 19 14 - 54 U/L   Alkaline Phosphatase 69 38 - 126 U/L   Total Bilirubin 0.4 0.3 - 1.2 mg/dL   GFR calc non Af Amer >60 >60 mL/min   GFR calc Af Amer >60 >60 mL/min    Comment: (NOTE) The eGFR has been calculated using the CKD EPI equation. This calculation has not been validated in all clinical situations. eGFR's persistently <60 mL/min signify possible Chronic  Kidney Disease.    Anion gap 10 5 - 15  CBC     Status: Abnormal   Collection Time: 07/17/15  8:45 PM  Result Value Ref Range   WBC 19.4 (H) 3.6 - 11.0 K/uL   RBC 5.41 (H) 3.80 - 5.20 MIL/uL   Hemoglobin 11.4 (L) 12.0 - 16.0 g/dL   HCT 36.7 35.0 - 47.0 %   MCV 67.8 (L) 80.0 - 100.0 fL   MCH 21.1 (L) 26.0 - 34.0 pg   MCHC 31.1 (L) 32.0 - 36.0 g/dL   RDW 28.8 (H) 11.5 - 14.5 %   Platelets 354 150 - 440 K/uL  Urinalysis complete, with microscopic ([facility] only)     Status: Abnormal   Collection Time: 07/17/15  8:45 PM  Result Value Ref Range   Color, Urine YELLOW (A) YELLOW   APPearance HAZY (A) CLEAR   Glucose, UA NEGATIVE NEGATIVE mg/dL   Bilirubin Urine NEGATIVE NEGATIVE   Ketones, ur TRACE (A) NEGATIVE mg/dL   Specific Gravity, Urine 1.024 1.005 - 1.030   Hgb urine dipstick NEGATIVE NEGATIVE   pH 5.0 5.0 - 8.0   Protein, ur 100 (A) NEGATIVE mg/dL   Nitrite NEGATIVE NEGATIVE   Leukocytes, UA NEGATIVE NEGATIVE   RBC / HPF NONE SEEN 0 - 5 RBC/hpf   WBC, UA 0-5 0 - 5 WBC/hpf   Bacteria, UA NONE SEEN NONE SEEN   Squamous Epithelial / LPF 6-30 (A) NONE SEEN   Mucous PRESENT    Hyaline Casts,  UA PRESENT   Pregnancy, urine     Status: None   Collection Time: 07/17/15  8:45 PM  Result Value Ref Range   Preg Test, Ur NEGATIVE NEGATIVE  C difficile quick scan w PCR reflex ([facility] only)     Status: Abnormal   Collection Time: 07/17/15  9:01 PM  Result Value Ref Range   C Diff antigen POSITIVE (A) NEGATIVE   C Diff toxin NEGATIVE NEGATIVE   C Diff interpretation      Positive for toxigenic C. difficile, active toxin production not detected. Patient has toxigenic C. difficile organisms present in the bowel, but toxin was not detected. The patient may be a carrier or the level of toxin in the sample was below the limit  of detection. This information should be used in conjunction with the patient's clinical history when deciding on possible therapy.     Comment: CRITICAL RESULT CALLED  TO, READ BACK BY AND VERIFIED WITH: APRIL BUMGARDENER @ [phone] MPG   Stool culture     Status: None   Collection Time: 07/17/15  9:01 PM  Result Value Ref Range   Specimen Description STOOL    Special Requests Normal    Culture      NO SALMONELLA OR SHIGELLA ISOLATED No Pathogenic E. coli detected NO CAMPYLOBACTER DETECTED    Report Status 07/20/2015 FINAL   Clostridium Difficile by PCR (not at [facility])     Status: Abnormal   Collection Time: 07/17/15  9:01 PM  Result Value Ref Range   Toxigenic C Difficile by pcr POSITIVE (A) NEGATIVE  CBC     Status: Abnormal   Collection Time: 07/18/15  6:24 AM  Result Value Ref Range   WBC 14.5 (H) 3.6 - 11.0 K/uL   RBC 4.53 3.80 - 5.20 MIL/uL   Hemoglobin 9.5 (L) 12.0 - 16.0 g/dL   HCT 30.9 (L) 35.0 - 47.0 %   MCV 68.2 (L) 80.0 - 100.0 fL   MCH 21.0 (L) 26.0 - 34.0 pg   MCHC 30.8 (L) 32.0 - 36.0 g/dL   RDW 29.8 (H) 11.5 - 14.5 %   Platelets 300 150 - 440 K/uL  Basic metabolic panel     Status: Abnormal   Collection Time: 07/18/15  6:24 AM  Result Value Ref Range   Sodium 137 135 - 145 mmol/L   Potassium 3.3 (L) 3.5 - 5.1 mmol/L   Chloride 105 101 - 111 mmol/L   CO2 25 22 - 32 mmol/L   Glucose, Bld 132 (H) 65 - 99 mg/dL   BUN 6 6 - 20 mg/dL   Creatinine, Ser 0.60 0.44 - 1.00 mg/dL   Calcium 8.6 (L) 8.9 - 10.3 mg/dL   GFR calc non Af Amer >60 >60 mL/min   GFR calc Af Amer >60 >60 mL/min    Comment: (NOTE) The eGFR has been calculated using the CKD EPI equation. This calculation has not been validated in all clinical situations. eGFR's persistently <60 mL/min signify possible Chronic Kidney Disease.    Anion gap 7 5 - 15  POCT rapid strep A     Status: Normal   Collection Time: 07/22/15  3:10 PM  Result Value Ref Range   Rapid Strep A Screen Negative Negative     Assessment & Plan  1. Oral thrush Likely due to antibiotic use which she has discontinued along with Florastor but she is eating probiotic yogurt.  -  nystatin (MYCOSTATIN) 100000 UNIT/ML suspension; Take 5 mLs (500,000 Units  total) by mouth 4 (four) times daily.  Dispense: 120 mL; Refill: 0  2. Chronic constipation Trial of metamucil. May be related to iron therapy.

## 2015-08-01 NOTE — Patient Instructions (Signed)

## 2015-08-05 ENCOUNTER — Telehealth: Payer: Self-pay | Admitting: Family Medicine

## 2015-08-05 NOTE — Telephone Encounter (Signed)
Contacted this patient to find out how we can be of assistance to her. She stated that since using the mouth wash, she has been having palpitations. I asked if she still had thrush and she said yes, so I informed her that I would consult with Dr. Sherley Bounds and will call her back, but that she could also give her cardiologist a call to see what they recommend. She stated that the paper said palpitations could be a side effect. Patient was asked if she was properly hydrating since dehydration can also cause palpiltations and she said she was.

## 2015-08-05 NOTE — Telephone Encounter (Signed)
Please return patients call when you can. She has questions about some of her medications.

## 2015-08-06 NOTE — Telephone Encounter (Signed)
The prescription anti-fungal mouth is only to be used for a few days and the palpitations should dissipate once she has completed the medication. If the palpitations are persistent then yes discuss with cardiologist.

## 2015-08-08 ENCOUNTER — Telehealth: Payer: Self-pay | Admitting: Family Medicine

## 2015-08-08 ENCOUNTER — Other Ambulatory Visit: Payer: Self-pay | Admitting: Family Medicine

## 2015-08-08 DIAGNOSIS — B370 Candidal stomatitis: Secondary | ICD-10-CM

## 2015-08-08 MED ORDER — FLUCONAZOLE 150 MG PO TABS: 150.0000 mg | ORAL_TABLET | Freq: Once | ORAL | Status: DC

## 2015-08-08 NOTE — Telephone Encounter (Signed)
Diflucan sent in for patient to use.  No other medications needed after that.

## 2015-08-08 NOTE — Telephone Encounter (Signed)
Patient is requesting something different that Nystatin. She feels that it triggers her heart palpitations. Please send new script to [facility]

## 2015-08-23 ENCOUNTER — Encounter: Payer: Self-pay | Admitting: Family Medicine

## 2015-08-23 ENCOUNTER — Ambulatory Visit (INDEPENDENT_AMBULATORY_CARE_PROVIDER_SITE_OTHER): Payer: Federal, State, Local not specified - PPO | Admitting: Family Medicine

## 2015-08-23 VITALS — BP 112/74 | HR 95 | Temp 98.2°F | Resp 16 | Wt 118.6 lb

## 2015-08-23 DIAGNOSIS — K644 Residual hemorrhoidal skin tags: Secondary | ICD-10-CM | POA: Diagnosis not present

## 2015-08-23 DIAGNOSIS — B370 Candidal stomatitis: Secondary | ICD-10-CM | POA: Diagnosis not present

## 2015-08-23 DIAGNOSIS — K5900 Constipation, unspecified: Secondary | ICD-10-CM | POA: Diagnosis not present

## 2015-08-23 DIAGNOSIS — K219 Gastro-esophageal reflux disease without esophagitis: Secondary | ICD-10-CM

## 2015-08-23 DIAGNOSIS — K5909 Other constipation: Secondary | ICD-10-CM

## 2015-08-23 MED ORDER — HYDROCORTISONE ACETATE 25 MG RE SUPP: 25.0000 mg | Freq: Two times a day (BID) | RECTAL | Status: DC

## 2015-08-23 NOTE — Patient Instructions (Signed)

## 2015-08-23 NOTE — Progress Notes (Signed)
Name: Jennifer Byrd   MRN: [id number]    DOB: [birth year]   Date:08/23/2015       Progress Note  Subjective  Chief Complaint  Chief Complaint  Patient presents with  . Constipation  . Sore Throat    was seen by ENT and was given an acid reducer to see if that would help with her throat.    HPI   Jennifer Byrd is a 19 year old female with recent C.diff colitis due to frequent antibiotic use. Finished treatment course for C.diff and notes resolved rectal bleeding with BMs, but still having constipation issues which is a long term problem. 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 She tried prune juice and miralax as recommended for constipation but it gave her headaches so she discontinued it. Sore throat initially improved with starting back on her allergy medications but now returned. Has established with ENT specialist and was given Omeprazole $RemoveBeforeDE'40mg'[id number]$  one a day to see if this helps with her symptoms. Not associated with oral lesion, sores, fevers, coughing. Does have oral thrush on her tongue only, nystatin swish and swallow did not agree with her, she never used the diflucan sent in, now using home remedy vinegar and water solution gargle. Patient complains of constipation which has improved since her last visit. Stool pattern has been 1 firm and formed stool(s) per day. Onset was several years ago Defecation has been incomplete. Co-Morbid conditions:culprit medication: iron replacement. Symptoms have been well controlled. Current Health Habits: Eating fiber? yes, amt adequate. Exercise? yes - aerobic and toning regularly. Water intake? yes, amt at least 60 oz a day.  She is also concerned about a mass she feel when wiping her bottom after a BM. She denies pain, swelling, bleeding, burning in her rectal area.    Patient Active Problem List   Diagnosis Date Noted  . Chronic constipation 08/01/2015  . 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 Oral thrush 07/22/2015  . Allergic  rhinitis with postnasal drip 07/22/2015  . History of Clostridium difficile colitis 07/18/2015  . Iron deficiency anemia 07/06/2015  . Palpitations 06/26/2015  . Acute urticaria 11/17/2014    Social History  Substance Use Topics  . Smoking status: Never Smoker   . Smokeless tobacco: Not on file  . Alcohol Use: No     Current outpatient prescriptions:  .  ferrous sulfate 220 (44 FE) MG/5ML solution, Take 5 mLs (220 mg total) by mouth 2 (two) times daily with a meal., Disp: 150 mL, Rfl: 3 .  fluconazole (DIFLUCAN) 150 MG tablet, Take 1 tablet (150 mg total) by mouth once., Disp: 1 tablet, Rfl: 0 .  omeprazole (PRILOSEC) 40 MG capsule, Take 40 mg by mouth daily. Take 30 to 60 minutes prior to evening meal., Disp: , Rfl:  .  saccharomyces boulardii (FLORASTOR) 250 MG capsule, Take 1 capsule (250 mg total) by mouth 2 (two) times daily., Disp: 30 capsule, Rfl: 0  No past surgical history on file.  Family History  Problem Relation Age of Onset  . Diabetes Mother   . Hypothyroidism Mother   . Diabetes Father   . Hypertension Father     Allergies  Allergen Reactions  . Amoxicillin Hives, Rash and Other (See Comments)    Patient states it makes her neck break out. Patient states it makes her neck break out.  [name] Amoxicillin-Pot Clavulanate Rash     Review of Systems  CONSTITUTIONAL: No significant weight changes, fever, chills, weakness or fatigue.  HEENT:  - Eyes: No visual changes.  - Ears: No auditory changes. No pain.  - Nose: No sneezing, congestion, runny nose. - Throat: No sore throat. No changes in swallowing. SKIN: No rash or itching.  CARDIOVASCULAR: No chest pain, chest pressure or chest discomfort. No palpitations or edema.  RESPIRATORY: No shortness of breath, cough or sputum.  GASTROINTESTINAL: No anorexia, nausea, vomiting. No changes in bowel habits. No abdominal pain or blood.  GENITOURINARY: No dysuria. No frequency. No discharge. NEUROLOGICAL: No headache,  dizziness, syncope, paralysis, ataxia, numbness or tingling in the extremities. No memory changes. No change in bowel or bladder control.  MUSCULOSKELETAL: No joint pain. No muscle pain. HEMATOLOGIC: No anemia, bleeding or bruising.  LYMPHATICS: No enlarged lymph nodes.  PSYCHIATRIC: No change in mood. No change in sleep pattern.  ENDOCRINOLOGIC: No reports of sweating, cold or heat intolerance. No polyuria or polydipsia.     Objective  Pulse 95  Temp(Src) 98.2 F (36.8 C) (Oral)  Resp 16  Wt 118 lb 9.6 oz (53.797 kg)  SpO2 98% Body mass index is 19.15 kg/(m^2).  Physical Exam  Constitutional: Patient appears well-developed and well-nourished. In no distress.  HEENT:  - Head: Normocephalic and atraumatic.  - Ears: Bilateral TMs gray, no erythema or effusion - Nose: Nasal mucosa moist - Mouth/Throat: Oropharynx is clear and moist. No tonsillar hypertrophy or erythema. No post nasal drainage.  - Eyes: Conjunctivae clear, EOM movements normal. PERRLA. No scleral icterus.  Neck: Normal range of motion. Neck supple. No JVD present. No thyromegaly present.  Cardiovascular: Normal rate, regular rhythm and normal heart sounds.  No murmur heard.  Pulmonary/Chest: Effort normal and breath sounds normal. No respiratory distress. Abdomen: Soft, non tender, non distended, normal bowel sounds. Rectal: Patient declined. Musculoskeletal: Normal range of motion bilateral UE and LE, no joint effusions. Peripheral vascular: Bilateral LE no edema. Neurological: CN II-XII grossly intact with no focal deficits. Alert and oriented to person, place, and time. Coordination, balance, strength, speech and gait are normal.  Skin: Skin is warm and dry. No rash noted. No erythema.  Psychiatric: Patient has a normal mood and affect. Behavior is normal in office today. Judgment and thought content normal in office today.   Recent Results (from the past 2160 hour(s))  CBC with Differential     Status:  Abnormal   Collection Time: 06/26/15  6:42 AM  Result Value Ref Range   WBC 18.4 (H) 3.6 - 11.0 K/uL   RBC 4.59 3.80 - 5.20 MIL/uL   Hemoglobin 8.6 (L) 12.0 - 16.0 g/dL   HCT 28.3 (L) 35.0 - 47.0 %   MCV 61.8 (L) 80.0 - 100.0 fL   MCH 18.7 (L) 26.0 - 34.0 pg   MCHC 30.3 (L) 32.0 - 36.0 g/dL   RDW 20.7 (H) 11.5 - 14.5 %   Platelets 160 150 - 440 K/uL    Comment: PLATELET COUNT CONFIRMED BY SMEAR   Neutrophils Relative % 86% %   Neutro Abs 15.7 (H) 1.4 - 6.5 K/uL   Lymphocytes Relative 5% %   Lymphs Abs 1.0 1.0 - 3.6 K/uL   Monocytes Relative 8% %   Monocytes Absolute 1.4 (H) 0.2 - 0.9 K/uL   Eosinophils Relative 1% %   Eosinophils Absolute 0.2 0 - 0.7 K/uL   Basophils Relative 0% %   Basophils Absolute 0.1 0 - 0.1 K/uL  Comprehensive metabolic panel     Status: Abnormal   Collection Time: 06/26/15  6:42 AM  Result  Value Ref Range   Sodium 134 (L) 135 - 145 mmol/L   Potassium 3.3 (L) 3.5 - 5.1 mmol/L   Chloride 102 101 - 111 mmol/L   CO2 23 22 - 32 mmol/L   Glucose, Bld 124 (H) 65 - 99 mg/dL   BUN 10 6 - 20 mg/dL   Creatinine, Ser 0.81 0.44 - 1.00 mg/dL   Calcium 9.3 8.9 - 10.3 mg/dL   Total Protein 8.4 (H) 6.5 - 8.1 g/dL   Albumin 4.2 3.5 - 5.0 g/dL   AST 25 15 - 41 U/L   ALT 12 (L) 14 - 54 U/L   Alkaline Phosphatase 55 38 - 126 U/L   Total Bilirubin 0.3 0.3 - 1.2 mg/dL   GFR calc non Af Amer >60 >60 mL/min   GFR calc Af Amer >60 >60 mL/min    Comment: (NOTE) The eGFR has been calculated using the CKD EPI equation. This calculation has not been validated in all clinical situations. eGFR's persistently <60 mL/min signify possible Chronic Kidney Disease.    Anion gap 9 5 - 15  TSH     Status: None   Collection Time: 06/26/15  6:42 AM  Result Value Ref Range   TSH 2.887 0.350 - 4.500 uIU/mL  T4, free     Status: None   Collection Time: 06/26/15  6:42 AM  Result Value Ref Range   Free T4 0.67 0.61 - 1.12 ng/dL  hCG, quantitative, pregnancy     Status: None    Collection Time: 06/26/15  6:42 AM  Result Value Ref Range   hCG, Beta Chain, Quant, S <1 <5 mIU/mL    Comment:          GEST. AGE      CONC.  (mIU/mL)   <=1 WEEK        5 - 50     2 WEEKS       50 - 500     3 WEEKS       100 - 10,000     4 WEEKS     1,000 - 30,000     5 WEEKS     3,500 - 115,000   6-8 WEEKS     12,000 - 270,000    12 WEEKS     15,000 - 220,000        FEMALE AND NON-PREGNANT FEMALE:     LESS THAN 5 mIU/mL   Troponin I     Status: None   Collection Time: 06/26/15  6:42 AM  Result Value Ref Range   Troponin I <0.03 <0.031 ng/mL    Comment:        NO INDICATION OF MYOCARDIAL INJURY.   Vitamin B12     Status: None   Collection Time: 06/26/15  6:42 AM  Result Value Ref Range   Vitamin B-12 674 180 - 914 pg/mL    Comment: (NOTE) This assay is not validated for testing neonatal or myeloproliferative syndrome specimens for Vitamin B12 levels. Performed at [facility]   Folate     Status: None   Collection Time: 06/26/15  6:42 AM  Result Value Ref Range   Folate 17.2 >5.9 ng/mL  Iron and TIBC     Status: Abnormal   Collection Time: 06/26/15  6:42 AM  Result Value Ref Range   Iron 23 (L) 28 - 170 ug/dL   TIBC 442 250 - 450 ug/dL   Saturation Ratios 5 (L) 10.4 - 31.8 %  UIBC 419 ug/dL  Ferritin     Status: None   Collection Time: 06/26/15  6:42 AM  Result Value Ref Range   Ferritin 11 11 - 307 ng/mL  Reticulocytes     Status: None   Collection Time: 06/26/15  6:42 AM  Result Value Ref Range   Retic Ct Pct 0.9 0.4 - 3.1 %   RBC. 4.63 3.80 - 5.20 MIL/uL   Retic Count, Manual 41.7 19.0 - 183.0 K/uL    Comment: 2.2297  Basic metabolic panel     Status: Abnormal   Collection Time: 06/27/15  5:11 AM  Result Value Ref Range   Sodium 134 (L) 135 - 145 mmol/L   Potassium 3.7 3.5 - 5.1 mmol/L   Chloride 106 101 - 111 mmol/L   CO2 22 22 - 32 mmol/L   Glucose, Bld 107 (H) 65 - 99 mg/dL   BUN <5 (L) 6 - 20 mg/dL   Creatinine, Ser 0.63 0.44 - 1.00 mg/dL    Calcium 8.6 (L) 8.9 - 10.3 mg/dL   GFR calc non Af Amer >60 >60 mL/min   GFR calc Af Amer >60 >60 mL/min    Comment: (NOTE) The eGFR has been calculated using the CKD EPI equation. This calculation has not been validated in all clinical situations. eGFR's persistently <60 mL/min signify possible Chronic Kidney Disease.    Anion gap 6 5 - 15  CBC     Status: Abnormal   Collection Time: 06/27/15  5:11 AM  Result Value Ref Range   WBC 16.0 (H) 3.6 - 11.0 K/uL   RBC 4.12 3.80 - 5.20 MIL/uL   Hemoglobin 7.8 (L) 12.0 - 16.0 g/dL   HCT 25.6 (L) 35.0 - 47.0 %   MCV 62.1 (L) 80.0 - 100.0 fL   MCH 19.0 (L) 26.0 - 34.0 pg   MCHC 30.6 (L) 32.0 - 36.0 g/dL   RDW 20.9 (H) 11.5 - 14.5 %   Platelets 166 150 - 440 K/uL    Comment: PLATELET COUNT CONFIRMED BY SMEAR  Urinalysis complete, with microscopic ([facility] only)     Status: Abnormal   Collection Time: 06/27/15  1:40 PM  Result Value Ref Range   Color, Urine YELLOW (A) YELLOW   APPearance CLEAR (A) CLEAR   Glucose, UA NEGATIVE NEGATIVE mg/dL   Bilirubin Urine NEGATIVE NEGATIVE   Ketones, ur NEGATIVE NEGATIVE mg/dL   Specific Gravity, Urine 1.012 1.005 - 1.030   Hgb urine dipstick NEGATIVE NEGATIVE   pH 6.0 5.0 - 8.0   Protein, ur 100 (A) NEGATIVE mg/dL   Nitrite NEGATIVE NEGATIVE   Leukocytes, UA NEGATIVE NEGATIVE   RBC / HPF 0-5 0 - 5 RBC/hpf   WBC, UA 0-5 0 - 5 WBC/hpf   Bacteria, UA NONE SEEN NONE SEEN   Squamous Epithelial / LPF 0-5 (A) NONE SEEN  CBC with Differential     Status: Abnormal   Collection Time: 07/01/15  2:56 PM  Result Value Ref Range   WBC 7.5 3.6 - 11.0 K/uL   RBC 4.76 3.80 - 5.20 MIL/uL   Hemoglobin 9.2 (L) 12.0 - 16.0 g/dL   HCT 29.9 (L) 35.0 - 47.0 %   MCV 62.9 (L) 80.0 - 100.0 fL   MCH 19.4 (L) 26.0 - 34.0 pg   MCHC 30.8 (L) 32.0 - 36.0 g/dL   RDW 22.0 (H) 11.5 - 14.5 %   Platelets 253 150 - 440 K/uL   Neutrophils Relative % 64 %   Neutro  Abs 4.8 1.4 - 6.5 K/uL   Lymphocytes Relative 20 %   Lymphs  Abs 1.5 1.0 - 3.6 K/uL   Monocytes Relative 10 %   Monocytes Absolute 0.8 0.2 - 0.9 K/uL   Eosinophils Relative 5 %   Eosinophils Absolute 0.3 0 - 0.7 K/uL   Basophils Relative 1 %   Basophils Absolute 0.1 0 - 0.1 K/uL  Comprehensive metabolic panel     Status: Abnormal   Collection Time: 07/01/15  2:56 PM  Result Value Ref Range   Sodium 139 135 - 145 mmol/L   Potassium 3.6 3.5 - 5.1 mmol/L   Chloride 103 101 - 111 mmol/L   CO2 28 22 - 32 mmol/L   Glucose, Bld 98 65 - 99 mg/dL   BUN 9 6 - 20 mg/dL   Creatinine, Ser 0.66 0.44 - 1.00 mg/dL   Calcium 9.4 8.9 - 10.3 mg/dL   Total Protein 7.9 6.5 - 8.1 g/dL   Albumin 4.2 3.5 - 5.0 g/dL   AST 20 15 - 41 U/L   ALT 17 14 - 54 U/L   Alkaline Phosphatase 69 38 - 126 U/L   Total Bilirubin 0.2 (L) 0.3 - 1.2 mg/dL   GFR calc non Af Amer >60 >60 mL/min   GFR calc Af Amer >60 >60 mL/min    Comment: (NOTE) The eGFR has been calculated using the CKD EPI equation. This calculation has not been validated in all clinical situations. eGFR's persistently <60 mL/min signify possible Chronic Kidney Disease.    Anion gap 8 5 - 15  Urinalysis complete, with microscopic     Status: Abnormal   Collection Time: 07/01/15  5:34 PM  Result Value Ref Range   Color, Urine YELLOW (A) YELLOW   APPearance CLEAR (A) CLEAR   Glucose, UA NEGATIVE NEGATIVE mg/dL   Bilirubin Urine NEGATIVE NEGATIVE   Ketones, ur NEGATIVE NEGATIVE mg/dL   Specific Gravity, Urine 1.017 1.005 - 1.030   Hgb urine dipstick 2+ (A) NEGATIVE   pH 7.0 5.0 - 8.0   Protein, ur 100 (A) NEGATIVE mg/dL   Nitrite NEGATIVE NEGATIVE   Leukocytes, UA NEGATIVE NEGATIVE   RBC / HPF TOO NUMEROUS TO COUNT 0 - 5 RBC/hpf   WBC, UA 0-5 0 - 5 WBC/hpf   Bacteria, UA NONE SEEN NONE SEEN   Squamous Epithelial / LPF 0-5 (A) NONE SEEN   Mucous PRESENT    Hyaline Casts, UA PRESENT    Amorphous Crystal PRESENT   Stool culture     Status: None   Collection Time: 07/01/15  5:34 PM  Result Value Ref  Range   Specimen Description Stool    Special Requests Normal    Report Status 07/08/2015 FINAL   CBC     Status: Abnormal   Collection Time: 07/06/15 12:15 PM  Result Value Ref Range   WBC 7.9 3.6 - 11.0 K/uL   RBC 4.62 3.80 - 5.20 MIL/uL   Hemoglobin 9.0 (L) 12.0 - 16.0 g/dL   HCT 29.9 (L) 35.0 - 47.0 %   MCV 64.8 (L) 80.0 - 100.0 fL   MCH 19.5 (L) 26.0 - 34.0 pg   MCHC 30.1 (L) 32.0 - 36.0 g/dL   RDW 21.6 (H) 11.5 - 14.5 %   Platelets 267 150 - 440 K/uL  Ferritin     Status: Abnormal   Collection Time: 07/06/15 12:15 PM  Result Value Ref Range   Ferritin 615 (H) 11 - 307 ng/mL  Iron and  TIBC     Status: None   Collection Time: 07/06/15 12:15 PM  Result Value Ref Range   Iron 45 28 - 170 ug/dL   TIBC 319 250 - 450 ug/dL   Saturation Ratios 14 10.4 - 31.8 %   UIBC 274 ug/dL  Vitamin B12     Status: None   Collection Time: 07/06/15 12:15 PM  Result Value Ref Range   Vitamin B-12 818 180 - 914 pg/mL    Comment: (NOTE) This assay is not validated for testing neonatal or myeloproliferative syndrome specimens for Vitamin B12 levels. 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 Suggest hematologic and clinical correlation. Performed At: [facility] [place], [place] [id number] Lindon Romp MD [id number]   Lipase, blood     Status: Abnormal    Collection Time: 07/17/15  8:45 PM  Result Value Ref Range   Lipase 19 (L) 22 - 51 U/L  Comprehensive metabolic panel     Status: Abnormal   Collection Time: 07/17/15  8:45 PM  Result Value Ref Range   Sodium 135 135 - 145 mmol/L   Potassium 3.1 (L) 3.5 - 5.1 mmol/L   Chloride 98 (L) 101 - 111 mmol/L   CO2 27 22 - 32 mmol/L   Glucose, Bld 140 (H) 65 - 99 mg/dL   BUN 7 6 - 20 mg/dL   Creatinine, Ser 0.82 0.44 - 1.00 mg/dL   Calcium 10.0 8.9 - 10.3 mg/dL   Total Protein 8.7 (H) 6.5 - 8.1 g/dL   Albumin 4.6 3.5 - 5.0 g/dL   AST 25 15 - 41 U/L   ALT 19 14 - 54 U/L   Alkaline Phosphatase 69 38 - 126 U/L   Total Bilirubin 0.4 0.3 - 1.2 mg/dL   GFR calc non Af Amer >60 >60 mL/min   GFR calc Af Amer >60 >60 mL/min    Comment: (NOTE) The eGFR has been calculated using the CKD EPI equation. This calculation has not been validated in all clinical situations. eGFR's persistently <60 mL/min signify possible Chronic Kidney Disease.    Anion gap 10 5 - 15  CBC     Status: Abnormal   Collection Time: 07/17/15  8:45 PM  Result Value Ref Range   WBC 19.4 (H) 3.6 - 11.0 K/uL   RBC 5.41 (H) 3.80 - 5.20 MIL/uL   Hemoglobin 11.4 (L) 12.0 - 16.0 g/dL   HCT 36.7 35.0 - 47.0 %   MCV 67.8 (L) 80.0 - 100.0 fL   MCH 21.1 (L) 26.0 - 34.0 pg   MCHC 31.1 (L) 32.0 - 36.0 g/dL   RDW 28.8 (H) 11.5 - 14.5 %  Platelets 354 150 - 440 K/uL  Urinalysis complete, with microscopic ([facility] only)     Status: Abnormal   Collection Time: 07/17/15  8:45 PM  Result Value Ref Range   Color, Urine YELLOW (A) YELLOW   APPearance HAZY (A) CLEAR   Glucose, UA NEGATIVE NEGATIVE mg/dL   Bilirubin Urine NEGATIVE NEGATIVE   Ketones, ur TRACE (A) NEGATIVE mg/dL   Specific Gravity, Urine 1.024 1.005 - 1.030   Hgb urine dipstick NEGATIVE NEGATIVE   pH 5.0 5.0 - 8.0   Protein, ur 100 (A) NEGATIVE mg/dL   Nitrite NEGATIVE NEGATIVE   Leukocytes, UA NEGATIVE NEGATIVE   RBC / HPF NONE SEEN 0 - 5 RBC/hpf   WBC, UA 0-5 0 -  5 WBC/hpf   Bacteria, UA NONE SEEN NONE SEEN   Squamous Epithelial / LPF 6-30 (A) NONE SEEN   Mucous PRESENT    Hyaline Casts, UA PRESENT   Pregnancy, urine     Status: None   Collection Time: 07/17/15  8:45 PM  Result Value Ref Range   Preg Test, Ur NEGATIVE NEGATIVE  C difficile quick scan w PCR reflex ([facility] only)     Status: Abnormal   Collection Time: 07/17/15  9:01 PM  Result Value Ref Range   C Diff antigen POSITIVE (A) NEGATIVE   C Diff toxin NEGATIVE NEGATIVE   C Diff interpretation      Positive for toxigenic C. difficile, active toxin production not detected. Patient has toxigenic C. difficile organisms present in the bowel, but toxin was not detected. The patient may be a carrier or the level of toxin in the sample was below the limit  of detection. This information should be used in conjunction with the patient's clinical history when deciding on possible therapy.     Comment: CRITICAL RESULT CALLED TO, READ BACK BY AND VERIFIED WITH: APRIL BUMGARDENER @ [phone] MPG   Stool culture     Status: None   Collection Time: 07/17/15  9:01 PM  Result Value Ref Range   Specimen Description STOOL    Special Requests Normal    Culture      NO SALMONELLA OR SHIGELLA ISOLATED No Pathogenic E. coli detected NO CAMPYLOBACTER DETECTED    Report Status 07/20/2015 FINAL   Clostridium Difficile by PCR (not at [facility]     Status: Abnormal   Collection Time: 07/17/15  9:01 PM  Result Value Ref Range   Toxigenic C Difficile by pcr POSITIVE (A) NEGATIVE  CBC     Status: Abnormal   Collection Time: 07/18/15  6:24 AM  Result Value Ref Range   WBC 14.5 (H) 3.6 - 11.0 K/uL   RBC 4.53 3.80 - 5.20 MIL/uL   Hemoglobin 9.5 (L) 12.0 - 16.0 g/dL   HCT 30.9 (L) 35.0 - 47.0 %   MCV 68.2 (L) 80.0 - 100.0 fL   MCH 21.0 (L) 26.0 - 34.0 pg   MCHC 30.8 (L) 32.0 - 36.0 g/dL   RDW 29.8 (H) 11.5 - 14.5 %   Platelets 300 150 - 440 K/uL  Basic metabolic panel     Status: Abnormal   Collection  Time: 07/18/15  6:24 AM  Result Value Ref Range   Sodium 137 135 - 145 mmol/L   Potassium 3.3 (L) 3.5 - 5.1 mmol/L   Chloride 105 101 - 111 mmol/L   CO2 25 22 - 32 mmol/L   Glucose, Bld 132 (H) 65 - 99 mg/dL   BUN 6 6 -  20 mg/dL   Creatinine, Ser 0.60 0.44 - 1.00 mg/dL   Calcium 8.6 (L) 8.9 - 10.3 mg/dL   GFR calc non Af Amer >60 >60 mL/min   GFR calc Af Amer >60 >60 mL/min    Comment: (NOTE) The eGFR has been calculated using the CKD EPI equation. This calculation has not been validated in all clinical situations. eGFR's persistently <60 mL/min signify possible Chronic Kidney Disease.    Anion gap 7 5 - 15  POCT rapid strep A     Status: Normal   Collection Time: 07/22/15  3:10 PM  Result Value Ref Range   Rapid Strep A Screen Negative Negative     Assessment & Plan  1. Chronic constipation Having daily stools Bristol Stool Scale type 3-4. Continue current regimen of lifestyle and diet changes.   2. Hemorrhoidal skin tag Although Gerrie was hesitant to be examined properly based on clinical story I have discussed with her that her symptoms do not sound like hemorrhoids but may be normal variant rectal skin tissue or tag.  If symptoms worsen she is agreeable for proper examination and referral for banding if needed.  - hydrocortisone (ANUSOL-HC) 25 MG suppository; Place 1 suppository (25 mg total) rectally 2 (two) times daily.  Dispense: 12 suppository; Refill: 0  3. GERD without esophagitis Notes improvement of sore throat symptoms with PPI use.  4. Oral thrush Nearly resolved.

## 2015-09-28 ENCOUNTER — Telehealth: Payer: Self-pay | Admitting: Family Medicine

## 2015-10-04 ENCOUNTER — Ambulatory Visit (INDEPENDENT_AMBULATORY_CARE_PROVIDER_SITE_OTHER): Payer: Federal, State, Local not specified - PPO | Admitting: Family Medicine

## 2015-10-04 ENCOUNTER — Encounter: Payer: Self-pay | Admitting: Family Medicine

## 2015-10-04 VITALS — BP 116/78 | HR 78 | Temp 98.8°F | Resp 16 | Ht 66.0 in | Wt 119.4 lb

## 2015-10-04 DIAGNOSIS — E559 Vitamin D deficiency, unspecified: Secondary | ICD-10-CM

## 2015-10-04 DIAGNOSIS — J312 Chronic pharyngitis: Secondary | ICD-10-CM | POA: Diagnosis not present

## 2015-10-04 MED ORDER — VITAMIN D3 5000 UNIT/ML PO LIQD: 1.0000 mL | Freq: Every day | ORAL | Status: DC

## 2015-10-04 NOTE — Progress Notes (Signed)
Name: Jennifer Byrd   MRN: [id number]    DOB: 1996-01-27   Date:10/04/2015       Progress Note  Subjective  Chief Complaint  Chief Complaint  Patient presents with  . Abnormal Lab    pt needs labs for vitamin D levels  . Sore Throat    pt states that her throat is still sore periodically     HPI  Jennifer Byrd saw Dr. Dario Guardian (her mother's doctor) for possible thyroid issue which they did not find. [name],[name] [name], [name]    Past Medical History  Diagnosis Date  . Acid reflux   . Strep throat   . Heart palpitations     Patient Active Problem List   Diagnosis Date Noted  . Hemorrhoidal skin tag 08/23/2015  . GERD without esophagitis 08/23/2015  . Chronic constipation 08/01/2015  . 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 Oral thrush 07/22/2015  . Allergic rhinitis with postnasal drip 07/22/2015  . History of Clostridium difficile colitis 07/18/2015  . Iron deficiency anemia 07/06/2015  . Palpitations 06/26/2015  . Acute urticaria 11/17/2014    Social History  Substance Use Topics  . Smoking status: Never Smoker   . Smokeless tobacco: Not on file  . Alcohol Use: No     Current outpatient prescriptions:  .  ferrous sulfate 220 (44 FE) MG/5ML solution, Take 5 mLs (220 mg total) by mouth 2 (two) times daily with a meal., Disp: 150 mL, Rfl: 3 .  fluticasone (FLONASE) 50 MCG/ACT nasal spray, instill 1 to 2 sprays into each nostril once daily, Disp: , Rfl: 0 .  hydrocortisone (ANUSOL-HC) 25 MG suppository, Place 1 suppository (25 mg total) rectally 2 (two) times daily., Disp: 12 suppository, Rfl: 0 .  omeprazole (PRILOSEC) 40 MG capsule, Take 40 mg by mouth daily. Take 30 to 60 minutes prior to evening meal., Disp: , Rfl:  .  Vitamin D, Ergocalciferol,  (DRISDOL) 50000 UNITS CAPS capsule, Take 50,000 Units by mouth once a week., Disp: , Rfl: 0  Allergies  Allergen Reactions  . Amoxicillin Hives, Rash and Other (See Comments)    Patient states it makes her neck break out. Patient states it makes her neck break out.  [name] Amoxicillin-Pot Clavulanate Rash    Review of Systems  Positive for chronic sore throat, low vit D on lab work, as mentioned in HPI, otherwise all systems reviewed and are negative.  Objective  BP 116/78 mmHg  Pulse 78  Temp(Src) 98.8 F (37.1 C)  Resp 16  Ht  (1.676 m)  Wt 119 lb 7 oz (54.176 kg)  BMI 19.29 kg/m2  SpO2 94%  LMP 10/04/2015  Body mass index is 19.29 kg/(m^2).   Physical Exam  Constitutional: Patient appears well-developed and well-nourished. In no distress.  HEENT:  - Head: Normocephalic and atraumatic.  - Ears: Bilateral TMs gray, no erythema or effusion - Nose: Nasal mucosa moist - Mouth/Throat: Oropharynx is clear and moist. No tonsillar hypertrophy or erythema. No post nasal drainage.  - Eyes: Conjunctivae clear, EOM movements normal. PERRLA. No scleral icterus.  Neck: Normal range of motion. Neck supple. No JVD present. No thyromegaly present.  Cardiovascular: Normal rate, regular rhythm and normal heart sounds.  No murmur heard.  Psychiatric: Patient has a normal mood and affect. Behavior is normal in office today. Judgment and thought content normal in office today.    Assessment & Plan  1. Vitamin D deficiency Recommended dietary changes if liquid form of Vit D not covered by insurance. Also there may be a chewable alternative OTC.  - Cholecalciferol (VITAMIN D3) 5000 UNIT/ML LIQD; Take 1 mL (5,000 Units total) by mouth daily.  Dispense: 59 mL; Refill: 1  2. Chronic sore throat I am not sure what the etiology of her chronic sore throat is. May be correlated to difficulty swallowing medications (psychogenic?). Jennifer Byrd seeks frequent medical attention for minor complaints  and is pre-occupied with unexplained symptoms.

## 2015-10-04 NOTE — Patient Instructions (Signed)
Vitamin D Deficiency Vitamin D deficiency is when your body does not have enough vitamin D. Vitamin D is important because:  It helps your body use other minerals that your body needs.  It helps keep your bones strong and healthy.  It may help to prevent some diseases.  It helps your heart and other muscles work well. You can get vitamin D by:  Eating foods with vitamin D in them.  Drinking or eating milk or other foods that have had vitamin D added to them.  Taking a vitamin D supplement.  Being in the sun. Not getting enough vitamin D can make your bones become soft. It can also cause other health problems. HOME CARE  Take medicines and supplements only as told by your doctor.  Eat foods that have vitamin D. These include:  Dairy products, cereals, or juices with added vitamin D. Check the label for vitamin D.   Fatty fish like salmon or trout.   Eggs.   Oysters.   Do not use tanning beds.   Stay at a healthy weight. Lose weight, if needed.   Keep all follow-up visits as told by your doctor. This is important.  GET HELP IF:  Your symptoms do not go away.  You feel sick to your stomach (nauseous).  Youthrow up (vomit).   You poop less often than usual or you have trouble pooping (constipation).   This information is not intended to replace advice given to you by your health care provider. Make sure you discuss any questions you have with your health care provider.   Document Released: 11/29/2011 Document Revised: 08/31/2015 Document Reviewed: 04/27/2015 Elsevier Interactive Patient Education 2016 Elsevier Inc.  

## 2015-10-05 ENCOUNTER — Other Ambulatory Visit: Payer: Self-pay | Admitting: *Deleted

## 2015-10-05 DIAGNOSIS — D509 Iron deficiency anemia, unspecified: Secondary | ICD-10-CM

## 2015-10-06 ENCOUNTER — Ambulatory Visit: Payer: Federal, State, Local not specified - PPO

## 2015-10-06 ENCOUNTER — Encounter: Payer: Self-pay | Admitting: Oncology

## 2015-10-06 ENCOUNTER — Inpatient Hospital Stay: Payer: Federal, State, Local not specified - PPO | Attending: Oncology

## 2015-10-06 ENCOUNTER — Ambulatory Visit: Payer: Federal, State, Local not specified - PPO | Admitting: Oncology

## 2015-10-06 ENCOUNTER — Other Ambulatory Visit: Payer: Federal, State, Local not specified - PPO

## 2015-10-06 ENCOUNTER — Inpatient Hospital Stay (HOSPITAL_BASED_OUTPATIENT_CLINIC_OR_DEPARTMENT_OTHER): Payer: Federal, State, Local not specified - PPO | Admitting: Oncology

## 2015-10-06 ENCOUNTER — Inpatient Hospital Stay: Payer: Federal, State, Local not specified - PPO

## 2015-10-06 VITALS — BP 116/80 | HR 70 | Temp 96.7°F | Resp 18 | Wt 119.5 lb

## 2015-10-06 DIAGNOSIS — D509 Iron deficiency anemia, unspecified: Secondary | ICD-10-CM

## 2015-10-06 DIAGNOSIS — Z79899 Other long term (current) drug therapy: Secondary | ICD-10-CM

## 2015-10-06 DIAGNOSIS — D562 Delta-beta thalassemia: Secondary | ICD-10-CM

## 2015-10-06 DIAGNOSIS — K219 Gastro-esophageal reflux disease without esophagitis: Secondary | ICD-10-CM | POA: Insufficient documentation

## 2015-10-06 LAB — CBC WITH DIFFERENTIAL/PLATELET
BASOS PCT: 1 %
Basophils Absolute: 0 10*3/uL (ref 0–0.1)
EOS ABS: 0.1 10*3/uL (ref 0–0.7)
Eosinophils Relative: 4 %
HCT: 40.2 % (ref 35.0–47.0)
Hemoglobin: 13 g/dL (ref 12.0–16.0)
Lymphocytes Relative: 30 %
Lymphs Abs: 1.3 10*3/uL (ref 1.0–3.6)
MCH: 25.4 pg — AB (ref 26.0–34.0)
MCHC: 32.4 g/dL (ref 32.0–36.0)
MCV: 78.4 fL — ABNORMAL LOW (ref 80.0–100.0)
Monocytes Absolute: 0.3 10*3/uL (ref 0.2–0.9)
Monocytes Relative: 8 %
NEUTROS PCT: 57 %
Neutro Abs: 2.4 10*3/uL (ref 1.4–6.5)
Platelets: 190 10*3/uL (ref 150–440)
RBC: 5.13 MIL/uL (ref 3.80–5.20)
RDW: 16.7 % — ABNORMAL HIGH (ref 11.5–14.5)
WBC: 4.1 10*3/uL (ref 3.6–11.0)

## 2015-10-06 LAB — IRON AND TIBC
Iron: 50 ug/dL (ref 28–170)
Saturation Ratios: 17 % (ref 10.4–31.8)
TIBC: 298 ug/dL (ref 250–450)
UIBC: 248 ug/dL

## 2015-10-06 LAB — FERRITIN: FERRITIN: 28 ng/mL (ref 11–307)

## 2015-10-06 NOTE — Telephone Encounter (Signed)
ERRENOUS °

## 2015-10-20 NOTE — Progress Notes (Signed)
[facility]  Telephone:(336) [phone] Fax:(336) [phone]  ID: Jennifer Byrd OB: 1996-04-03  MR#: [id number]  AOZ#:[id number]#:[id number]  Patient Care Team: Edwena FeltyAshany Sundaram, MD as PCP - General (Family Medicine)  CHIEF COMPLAINT:  Chief Complaint  Patient presents with  . IDA    INTERVAL HISTORY: Patient returns to clinic today for repeat laboratory work and further evaluation. She currently feels well and is asymptomatic. She has no neurologic complaints. She denies any recent fevers. She denies any weakness or fatigue. She has no chest pain or shortness of breath. She denies any nausea, vomiting, constipation, or diarrhea. She has no urinary complaints. Patient offers no specific complaints today.  REVIEW OF SYSTEMS:   Review of Systems  Constitutional: Negative.   Respiratory: Negative.   Cardiovascular: Negative.   Gastrointestinal: Negative.  Negative for blood in stool and melena.  Neurological: Negative.     As per HPI. Otherwise, a complete review of systems is negatve.  PAST MEDICAL HISTORY: Past Medical History  Diagnosis Date  . Acid reflux   . Strep throat   . Heart palpitations   . Anemia     PAST SURGICAL HISTORY: History reviewed. No pertinent past surgical history.  FAMILY HISTORY Family History  Problem Relation Age of Onset  . Diabetes Mother   . Hypothyroidism Mother   . Diabetes Father   . Hypertension Father        ADVANCED DIRECTIVES:    HEALTH MAINTENANCE: Social History  Substance Use Topics  . Smoking status: Never Smoker   . Smokeless tobacco: None  . Alcohol Use: No     Colonoscopy:  PAP:  Bone density:  Lipid panel:  Allergies  Allergen Reactions  . Amoxicillin Hives, Rash and Other (See Comments)    Patient states it makes her neck break out. Patient states it makes her neck break out.  [name]. Amoxicillin-Pot Clavulanate Rash    Current Outpatient Prescriptions  Medication Sig Dispense Refill  .  Cholecalciferol (VITAMIN D3) 5000 UNIT/ML LIQD Take 1 mL (5,000 Units total) by mouth daily. 59 mL 1  . ferrous sulfate 220 (44 FE) MG/5ML solution Take 5 mLs (220 mg total) by mouth 2 (two) times daily with a meal. 150 mL 3  . fluticasone (FLONASE) 50 MCG/ACT nasal spray instill 1 to 2 sprays into each nostril once daily  0  . hydrocortisone (ANUSOL-HC) 25 MG suppository Place 1 suppository (25 mg total) rectally 2 (two) times daily. 12 suppository 0  . omeprazole (PRILOSEC) 40 MG capsule Take 40 mg by mouth daily. Take 30 to 60 minutes prior to evening meal.    . Vitamin D, Ergocalciferol, (DRISDOL) 50000 UNITS CAPS capsule Take 50,000 Units by mouth once a week.  0   No current facility-administered medications for this visit.    OBJECTIVE: Filed Vitals:   10/06/15 1216  BP: 116/80  Pulse: 70  Temp: 96.7 F (35.9 C)  Resp: 18     Body mass index is 19.3 kg/(m^2).    ECOG FS:0 - Asymptomatic  General: Well-developed, well-nourished, no acute distress. Eyes: Pink conjunctiva, anicteric sclera. Lungs: Clear to auscultation bilaterally. Heart: Regular rate and rhythm. No rubs, murmurs, or gallops. Abdomen: Soft, nontender, nondistended. No organomegaly noted, normoactive bowel sounds. Musculoskeletal: No edema, cyanosis, or clubbing. Neuro: Alert, answering all questions appropriately. Cranial nerves grossly intact. Skin: No rashes or petechiae noted. Psych: Normal affect.   LAB RESULTS:  Lab Results  Component Value Date   NA 137 07/18/2015  K 3.3* 07/18/2015   CL 105 07/18/2015   CO2 25 07/18/2015   GLUCOSE 132* 07/18/2015   BUN 6 07/18/2015   CREATININE 0.60 07/18/2015   CALCIUM 8.6* 07/18/2015   PROT 8.7* 07/17/2015   ALBUMIN 4.6 07/17/2015   AST 25 07/17/2015   ALT 19 07/17/2015   ALKPHOS 69 07/17/2015   BILITOT 0.4 07/17/2015   GFRNONAA >60 07/18/2015   GFRAA >60 07/18/2015    Lab Results  Component Value Date   WBC 4.1 10/06/2015   NEUTROABS 2.4  10/06/2015   HGB 13.0 10/06/2015   HCT 40.2 10/06/2015   MCV 78.4* 10/06/2015   PLT 190 10/06/2015     STUDIES: No results found.  ASSESSMENT: Delta beta thalassemia, mild iron deficiency.  PLAN:    1. Anemia: Patient's hemoglobin and iron stores are now within normal limits. She does not require additional IV Feraheme today. Patient last received treatment in July 2016. Return to clinic in 3 months with repeat laboratory work and further evaluation., Of note patient's, hemoglobin electrophoresis is consistent with a delta beta thalassemia which is typically associated with mild anemia and a significant microcytosis.   Patient expressed understanding and was in agreement with this plan. She also understands that She can call clinic at any time with any questions, concerns, or complaints.    Jeralyn Ruths, MD   10/20/2015 5:40 PM

## 2015-11-07 ENCOUNTER — Telehealth: Payer: Self-pay | Admitting: Family Medicine

## 2015-11-07 NOTE — Telephone Encounter (Signed)
I will ask Jennifer JacobsonHelen if she could check the [facility] to see if her immunizations are up to date.

## 2015-11-07 NOTE — Telephone Encounter (Signed)
Pt would like a call back concerning her shots. She is starting college in January and needs to see if anything needs updated. Please advise.

## 2015-11-08 ENCOUNTER — Telehealth: Payer: Self-pay

## 2015-11-08 ENCOUNTER — Other Ambulatory Visit: Payer: Self-pay | Admitting: Family Medicine

## 2015-11-08 NOTE — Telephone Encounter (Signed)
Per Dr. Sherley BoundsSundaram, I tried to contact this patient to ask what dates is she requesting for disability, but there was no answer. A message was left for this patient to give us a call when she got the chance.

## 2015-11-08 NOTE — Telephone Encounter (Signed)
Patient returned my call and stated that those forms were for her allergist and that she just needed a note from our office so that she can take her reflux medicine at school. Dr. Sherley BoundsSundaram was informed and a note was printed then placed at the front for pick-up.

## 2015-11-10 NOTE — Telephone Encounter (Signed)
Patient chart in [facility] was incomplete. Copy given to Sierra LeoneLatisha

## 2015-12-28 ENCOUNTER — Inpatient Hospital Stay (HOSPITAL_BASED_OUTPATIENT_CLINIC_OR_DEPARTMENT_OTHER): Payer: Federal, State, Local not specified - PPO | Admitting: Oncology

## 2015-12-28 ENCOUNTER — Inpatient Hospital Stay: Payer: Federal, State, Local not specified - PPO | Attending: Oncology

## 2015-12-28 ENCOUNTER — Inpatient Hospital Stay: Payer: Federal, State, Local not specified - PPO

## 2015-12-28 ENCOUNTER — Other Ambulatory Visit: Payer: Self-pay | Admitting: *Deleted

## 2015-12-28 VITALS — BP 118/70 | HR 69 | Resp 16 | Wt 122.1 lb

## 2015-12-28 DIAGNOSIS — Z79899 Other long term (current) drug therapy: Secondary | ICD-10-CM | POA: Diagnosis not present

## 2015-12-28 DIAGNOSIS — R002 Palpitations: Secondary | ICD-10-CM | POA: Diagnosis not present

## 2015-12-28 DIAGNOSIS — D509 Iron deficiency anemia, unspecified: Secondary | ICD-10-CM | POA: Diagnosis not present

## 2015-12-28 DIAGNOSIS — K219 Gastro-esophageal reflux disease without esophagitis: Secondary | ICD-10-CM

## 2015-12-28 DIAGNOSIS — D562 Delta-beta thalassemia: Secondary | ICD-10-CM | POA: Insufficient documentation

## 2015-12-28 LAB — CBC WITH DIFFERENTIAL/PLATELET
BASOS PCT: 1 %
Basophils Absolute: 0 10*3/uL (ref 0–0.1)
Eosinophils Absolute: 0.2 10*3/uL (ref 0–0.7)
Eosinophils Relative: 5 %
HEMATOCRIT: 40.4 % (ref 35.0–47.0)
HEMOGLOBIN: 13 g/dL (ref 12.0–16.0)
Lymphocytes Relative: 22 %
Lymphs Abs: 1.1 10*3/uL (ref 1.0–3.6)
MCH: 26 pg (ref 26.0–34.0)
MCHC: 32.2 g/dL (ref 32.0–36.0)
MCV: 80.8 fL (ref 80.0–100.0)
Monocytes Absolute: 0.4 10*3/uL (ref 0.2–0.9)
Monocytes Relative: 7 %
NEUTROS PCT: 65 %
Neutro Abs: 3.3 10*3/uL (ref 1.4–6.5)
Platelets: 192 10*3/uL (ref 150–440)
RBC: 5 MIL/uL (ref 3.80–5.20)
RDW: 16 % — ABNORMAL HIGH (ref 11.5–14.5)
WBC: 5 10*3/uL (ref 3.6–11.0)

## 2015-12-28 LAB — FERRITIN: Ferritin: 26 ng/mL (ref 11–307)

## 2015-12-28 LAB — IRON AND TIBC
Iron: 65 ug/dL (ref 28–170)
SATURATION RATIOS: 22 % (ref 10.4–31.8)
TIBC: 300 ug/dL (ref 250–450)
UIBC: 235 ug/dL

## 2015-12-28 NOTE — Progress Notes (Signed)
[facility]  Telephone:(336) [phone] Fax:(336) [phone]  ID: Jennifer Byrd OB: 1996-11-02  MR#: [id number]  HYQ#:[id number]  Patient Care Team: Edwena Felty, MD as PCP - General (Family Medicine)  CHIEF COMPLAINT:  Chief Complaint  Patient presents with  . Anemia    INTERVAL HISTORY: Patient returns to clinic today for repeat laboratory work and further evaluation. She currently feels well and is asymptomatic.  She stopped taking oral iron supplementation approximately one month ago. She has no neurologic complaints. She denies any recent fevers. She denies any weakness or fatigue. She has no chest pain or shortness of breath. She denies any nausea, vomiting, constipation, or diarrhea. She has no urinary complaints. Patient offers no specific complaints today.  REVIEW OF SYSTEMS:   Review of Systems  Constitutional: Negative.   Respiratory: Negative.   Cardiovascular: Negative.   Gastrointestinal: Negative.  Negative for blood in stool and melena.  Neurological: Negative.     As per HPI. Otherwise, a complete review of systems is negatve.  PAST MEDICAL HISTORY: Past Medical History  Diagnosis Date  . Acid reflux   . Strep throat   . Heart palpitations   . Anemia     PAST SURGICAL HISTORY: No past surgical history on file.  FAMILY HISTORY Family History  Problem Relation Age of Onset  . Diabetes Mother   . Hypothyroidism Mother   . Diabetes Father   . Hypertension Father        ADVANCED DIRECTIVES:    HEALTH MAINTENANCE: Social History  Substance Use Topics  . Smoking status: Never Smoker   . Smokeless tobacco: Not on file  . Alcohol Use: No     Colonoscopy:  PAP:  Bone density:  Lipid panel:  Allergies  Allergen Reactions  . Amoxicillin Hives, Rash and Other (See Comments)    Patient states it makes her neck break out. Patient states it makes her neck break out.  [name] Amoxicillin-Pot Clavulanate Rash    Current  Outpatient Prescriptions  Medication Sig Dispense Refill  . atenolol (TENORMIN) 25 MG tablet Take by mouth.    . Cholecalciferol (VITAMIN D3) 5000 UNIT/ML LIQD Take 1 mL (5,000 Units total) by mouth daily. 59 mL 1  . ferrous sulfate 220 (44 FE) MG/5ML solution Take 5 mLs (220 mg total) by mouth 2 (two) times daily with a meal. 150 mL 3  . fluticasone (FLONASE) 50 MCG/ACT nasal spray instill 1 to 2 sprays into each nostril once daily  0  . hydrocortisone (ANUSOL-HC) 25 MG suppository Place 1 suppository (25 mg total) rectally 2 (two) times daily. 12 suppository 0  . omeprazole (PRILOSEC) 40 MG capsule Take 40 mg by mouth daily. Take 30 to 60 minutes prior to evening meal.    . Vitamin D, Ergocalciferol, (DRISDOL) 50000 UNITS CAPS capsule Take 50,000 Units by mouth once a week. Reported on 12/28/2015  0   No current facility-administered medications for this visit.    OBJECTIVE: Filed Vitals:   12/28/15 1219  BP: 118/70  Pulse: 69  Resp: 16     Body mass index is 19.72 kg/(m^2).    ECOG FS:0 - Asymptomatic  General: Well-developed, well-nourished, no acute distress. Eyes: Pink conjunctiva, anicteric sclera. Lungs: Clear to auscultation bilaterally. Heart: Regular rate and rhythm. No rubs, murmurs, or gallops. Abdomen: Soft, nontender, nondistended. No organomegaly noted, normoactive bowel sounds. Musculoskeletal: No edema, cyanosis, or clubbing. Neuro: Alert, answering all questions appropriately. Cranial nerves grossly intact. Skin: No rashes or petechiae noted.  Psych: Normal affect.   LAB RESULTS:  Lab Results  Component Value Date   NA 137 07/18/2015   K 3.3* 07/18/2015   CL 105 07/18/2015   CO2 25 07/18/2015   GLUCOSE 132* 07/18/2015   BUN 6 07/18/2015   CREATININE 0.60 07/18/2015   CALCIUM 8.6* 07/18/2015   PROT 8.7* 07/17/2015   ALBUMIN 4.6 07/17/2015   AST 25 07/17/2015   ALT 19 07/17/2015   ALKPHOS 69 07/17/2015   BILITOT 0.4 07/17/2015   GFRNONAA >60 07/18/2015     GFRAA >60 07/18/2015    Lab Results  Component Value Date   WBC 5.0 12/28/2015   NEUTROABS 3.3 12/28/2015   HGB 13.0 12/28/2015   HCT 40.4 12/28/2015   MCV 80.8 12/28/2015   PLT 192 12/28/2015     STUDIES: No results found.  ASSESSMENT: Delta beta thalassemia, mild iron deficiency.  PLAN:    1. Anemia: Patient's hemoglobin and iron stores continue to be within normal limits. She does not require additional IV Feraheme today. Patient last received treatment in July 2016. Return to clinic in May 2017 after her school semester is over for repeat laboratory work and further evaluation. Of note patient's, hemoglobin electrophoresis is consistent with a delta beta thalassemia which is typically associated with mild anemia and a significant microcytosis.   Patient expressed understanding and was in agreement with this plan. She also understands that She can call clinic at any time with any questions, concerns, or complaints.    [name], MD   12/28/2015 1:41 PM

## 2015-12-28 NOTE — Progress Notes (Signed)
Patient has been out of her oral for about 1 month.

## 2016-01-05 ENCOUNTER — Inpatient Hospital Stay: Payer: Federal, State, Local not specified - PPO

## 2016-01-05 ENCOUNTER — Inpatient Hospital Stay: Payer: Federal, State, Local not specified - PPO | Admitting: Oncology

## 2016-01-23 DIAGNOSIS — J3089 Other allergic rhinitis: Secondary | ICD-10-CM | POA: Insufficient documentation

## 2016-05-04 ENCOUNTER — Other Ambulatory Visit: Payer: Self-pay | Admitting: Oncology

## 2016-05-04 DIAGNOSIS — D509 Iron deficiency anemia, unspecified: Secondary | ICD-10-CM

## 2016-05-07 ENCOUNTER — Other Ambulatory Visit: Payer: Self-pay | Admitting: Oncology

## 2016-05-08 ENCOUNTER — Inpatient Hospital Stay: Payer: Federal, State, Local not specified - PPO | Admitting: Oncology

## 2016-05-08 ENCOUNTER — Inpatient Hospital Stay: Payer: Federal, State, Local not specified - PPO

## 2016-05-29 ENCOUNTER — Other Ambulatory Visit: Payer: Federal, State, Local not specified - PPO

## 2016-05-29 ENCOUNTER — Ambulatory Visit: Payer: Federal, State, Local not specified - PPO | Admitting: Oncology

## 2016-05-29 ENCOUNTER — Ambulatory Visit: Payer: Federal, State, Local not specified - PPO

## 2016-08-06 ENCOUNTER — Inpatient Hospital Stay: Payer: Federal, State, Local not specified - PPO

## 2016-08-06 ENCOUNTER — Inpatient Hospital Stay: Payer: Federal, State, Local not specified - PPO | Attending: Oncology | Admitting: Oncology

## 2016-08-06 ENCOUNTER — Encounter (INDEPENDENT_AMBULATORY_CARE_PROVIDER_SITE_OTHER): Payer: Self-pay

## 2016-08-06 VITALS — BP 111/73 | HR 64 | Temp 97.7°F | Resp 18 | Wt 127.2 lb

## 2016-08-06 DIAGNOSIS — K219 Gastro-esophageal reflux disease without esophagitis: Secondary | ICD-10-CM | POA: Diagnosis not present

## 2016-08-06 DIAGNOSIS — Z79899 Other long term (current) drug therapy: Secondary | ICD-10-CM | POA: Diagnosis not present

## 2016-08-06 DIAGNOSIS — R002 Palpitations: Secondary | ICD-10-CM | POA: Insufficient documentation

## 2016-08-06 DIAGNOSIS — D509 Iron deficiency anemia, unspecified: Secondary | ICD-10-CM | POA: Insufficient documentation

## 2016-08-06 DIAGNOSIS — D562 Delta-beta thalassemia: Secondary | ICD-10-CM

## 2016-08-06 LAB — CBC WITH DIFFERENTIAL/PLATELET
BASOS PCT: 1 %
Basophils Absolute: 0 10*3/uL (ref 0–0.1)
EOS PCT: 4 %
Eosinophils Absolute: 0.2 10*3/uL (ref 0–0.7)
HEMATOCRIT: 38.2 % (ref 35.0–47.0)
Hemoglobin: 12.4 g/dL (ref 12.0–16.0)
LYMPHS PCT: 37 %
Lymphs Abs: 1.6 10*3/uL (ref 1.0–3.6)
MCH: 24.8 pg — ABNORMAL LOW (ref 26.0–34.0)
MCHC: 32.4 g/dL (ref 32.0–36.0)
MCV: 76.4 fL — AB (ref 80.0–100.0)
MONO ABS: 0.3 10*3/uL (ref 0.2–0.9)
MONOS PCT: 8 %
NEUTROS ABS: 2.1 10*3/uL (ref 1.4–6.5)
Neutrophils Relative %: 50 %
PLATELETS: 184 10*3/uL (ref 150–440)
RBC: 5 MIL/uL (ref 3.80–5.20)
RDW: 17.9 % — AB (ref 11.5–14.5)
WBC: 4.2 10*3/uL (ref 3.6–11.0)

## 2016-08-06 LAB — IRON AND TIBC
Iron: 70 ug/dL (ref 28–170)
Saturation Ratios: 20 % (ref 10.4–31.8)
TIBC: 356 ug/dL (ref 250–450)
UIBC: 286 ug/dL

## 2016-08-06 LAB — FERRITIN: FERRITIN: 17 ng/mL (ref 11–307)

## 2016-08-06 NOTE — Progress Notes (Signed)
[facility]  Telephone:(336) [phone] Fax:(336) [phone]  ID: Jennifer Planmber Nicole Byrd OB: 05/20/1996  MR#: [id number]  IHK#:[id number]#:[id number]  Patient Care Team: No Pcp Per Patient as PCP - General (General Practice)  CHIEF COMPLAINT: Delta beta thalassemia, mild iron deficiency anemia.  INTERVAL HISTORY: Patient returns to clinic today for repeat laboratory work and further evaluation. She continues to feel well and is asymptomatic. She denies any weakness or fatigue. She has no neurologic complaints. She denies any recent fevers. She has no chest pain or shortness of breath. She denies any nausea, vomiting, constipation, or diarrhea. She has no urinary complaints. Patient offers no specific complaints today.  REVIEW OF SYSTEMS:   Review of Systems  Constitutional: Negative.  Negative for fever, malaise/fatigue and weight loss.  Respiratory: Negative.  Negative for shortness of breath.   Cardiovascular: Negative.  Negative for chest pain.  Gastrointestinal: Negative.  Negative for abdominal pain, blood in stool and melena.  Genitourinary: Negative.   Musculoskeletal: Negative.   Neurological: Negative.  Negative for weakness.  Psychiatric/Behavioral: Negative.     As per HPI. Otherwise, a complete review of systems is negatve.  PAST MEDICAL HISTORY: Past Medical History:  Diagnosis Date  . Acid reflux   . Anemia   . Heart palpitations   . Strep throat     PAST SURGICAL HISTORY: No past surgical history on file.  FAMILY HISTORY Family History  Problem Relation Age of Onset  . Diabetes Mother   . Hypothyroidism Mother   . Diabetes Father   . Hypertension Father        ADVANCED DIRECTIVES:    HEALTH MAINTENANCE: Social History  Substance Use Topics  . Smoking status: Never Smoker  . Smokeless tobacco: Not on file  . Alcohol use No     Colonoscopy:  PAP:  Bone density:  Lipid panel:  Allergies  Allergen Reactions  . Amoxicillin Hives, Rash and  Other (See Comments)    Patient states it makes her neck break out. Patient states it makes her neck break out.  [name]. Amoxicillin-Pot Clavulanate Rash    Current Outpatient Prescriptions  Medication Sig Dispense Refill  . atenolol (TENORMIN) 25 MG tablet Take by mouth.    . Cholecalciferol (VITAMIN D3) 5000 UNIT/ML LIQD Take 1 mL (5,000 Units total) by mouth daily. 59 mL 1  . ferrous sulfate 220 (44 FE) MG/5ML solution Take 5 mLs (220 mg total) by mouth 2 (two) times daily with a meal. 150 mL 3  . fluticasone (FLONASE) 50 MCG/ACT nasal spray instill 1 to 2 sprays into each nostril once daily  0  . hydrocortisone (ANUSOL-HC) 25 MG suppository Place 1 suppository (25 mg total) rectally 2 (two) times daily. 12 suppository 0  . omeprazole (PRILOSEC) 40 MG capsule Take 40 mg by mouth daily. Take 30 to 60 minutes prior to evening meal.    . Vitamin D, Ergocalciferol, (DRISDOL) 50000 UNITS CAPS capsule Take 50,000 Units by mouth once a week. Reported on 12/28/2015  0   No current facility-administered medications for this visit.     OBJECTIVE: Vitals:   08/06/16 1429  BP: 111/73  Pulse: 64  Resp: 18  Temp: 97.7 F (36.5 C)     Body mass index is 20.53 kg/m.    ECOG FS:0 - Asymptomatic  General: Well-developed, well-nourished, no acute distress. Eyes: Pink conjunctiva, anicteric sclera. Lungs: Clear to auscultation bilaterally. Heart: Regular rate and rhythm. No rubs, murmurs, or gallops. Abdomen: Soft, nontender, nondistended. No organomegaly noted,  normoactive bowel sounds. Musculoskeletal: No edema, cyanosis, or clubbing. Neuro: Alert, answering all questions appropriately. Cranial nerves grossly intact. Skin: No rashes or petechiae noted. Psych: Normal affect.   LAB RESULTS:  Lab Results  Component Value Date   NA 137 07/18/2015   K 3.3 (L) 07/18/2015   CL 105 07/18/2015   CO2 25 07/18/2015   GLUCOSE 132 (H) 07/18/2015   BUN 6 07/18/2015   CREATININE 0.60 07/18/2015    CALCIUM 8.6 (L) 07/18/2015   PROT 8.7 (H) 07/17/2015   ALBUMIN 4.6 07/17/2015   AST 25 07/17/2015   ALT 19 07/17/2015   ALKPHOS 69 07/17/2015   BILITOT 0.4 07/17/2015   GFRNONAA >60 07/18/2015   GFRAA >60 07/18/2015    Lab Results  Component Value Date   WBC 4.2 08/06/2016   NEUTROABS 2.1 08/06/2016   HGB 12.4 08/06/2016   HCT 38.2 08/06/2016   MCV 76.4 (L) 08/06/2016   PLT 184 08/06/2016   Lab Results  Component Value Date   IRON 70 08/06/2016   TIBC 356 08/06/2016   IRONPCTSAT 20 08/06/2016   Lab Results  Component Value Date   FERRITIN 17 08/06/2016     STUDIES: No results found.  ASSESSMENT: Delta beta thalassemia, mild iron deficiency anemia.  PLAN:    1. Delta beta thalassemia: Confirmed by hemoglobin electrophoresis. No intervention is needed, but patient will likely always have a baseline microcytosis. 2. Iron deficiency anemia: Patient's hemoglobin and iron stores continue to be within normal limits. No intervention is needed at this time. Patient has been instructed to reinitiate oral iron supplementation. Patient is leaving for school at [employer] in the next week. No follow-up is necessary.  Please refer patient back if her iron panel begins to decrease or be she becomes symptomatic.  Patient expressed understanding and was in agreement with this plan. She also understands that She can call clinic at any time with any questions, concerns, or complaints.    Jeralyn Ruthsimothy J Turrell Severt, MD   08/07/2016 8:18 AM

## 2016-08-06 NOTE — Progress Notes (Signed)
States is feeling tired today. Has questions regarding multivitamin and would like a recommendation on which one to take since has several allergies as well.

## 2016-10-05 DIAGNOSIS — R0600 Dyspnea, unspecified: Secondary | ICD-10-CM | POA: Insufficient documentation

## 2016-12-16 ENCOUNTER — Encounter: Payer: Self-pay | Admitting: Emergency Medicine

## 2016-12-16 DIAGNOSIS — Z5321 Procedure and treatment not carried out due to patient leaving prior to being seen by health care provider: Secondary | ICD-10-CM | POA: Insufficient documentation

## 2016-12-16 DIAGNOSIS — R079 Chest pain, unspecified: Secondary | ICD-10-CM | POA: Insufficient documentation

## 2016-12-16 NOTE — ED Triage Notes (Signed)
Pt says all day today she's had pain to the center of her chest; unable to describe other than to say it's not burning or sharp; says she has a pain about every 5 minutes and then goes completely away; with the pain, she rates it 7/10; currently pain-free; pt with history of GERD but medication not helping symptoms today; pt admits to being under stress but did not provide further information; pt tearful while on cell phone in triage, but fine after hanging up; ambulatory with steady gait;

## 2016-12-17 ENCOUNTER — Emergency Department
Admission: EM | Admit: 2016-12-17 | Discharge: 2016-12-17 | Disposition: A | Discharge status: Home / Self Care | Payer: Federal, State, Local not specified - PPO | Attending: Emergency Medicine | Admitting: Emergency Medicine

## 2016-12-17 HISTORY — DX: Anxiety disorder, unspecified: F41.9

## 2016-12-17 NOTE — ED Notes (Signed)
No answer when called for vital sign recheck 

## 2016-12-17 NOTE — ED Notes (Signed)
Pt waiting patiently in the lobby for treatment room; awake and alert 

## 2016-12-17 NOTE — ED Notes (Signed)
Pt up to stat registration stating her dad just wants her to leave if she hasn't been "charged the full price for the visit yet"; told pt I believe at this point she's only been charged for the registration/triage process;  But pt encouraged to stay; will call her father back and discuss further with him;

## 2019-04-24 ENCOUNTER — Ambulatory Visit: Payer: Federal, State, Local not specified - PPO | Admitting: Family Medicine

## 2019-05-27 ENCOUNTER — Ambulatory Visit: Payer: Federal, State, Local not specified - PPO | Admitting: Family Medicine

## 2019-05-27 ENCOUNTER — Encounter: Payer: Self-pay | Admitting: Family Medicine

## 2019-05-27 ENCOUNTER — Other Ambulatory Visit: Payer: Self-pay

## 2019-05-27 VITALS — BP 122/84 | HR 87 | Temp 98.0°F | Resp 12 | Ht 66.0 in | Wt 138.3 lb

## 2019-05-27 DIAGNOSIS — K5900 Constipation, unspecified: Secondary | ICD-10-CM | POA: Diagnosis not present

## 2019-05-27 DIAGNOSIS — R197 Diarrhea, unspecified: Secondary | ICD-10-CM | POA: Diagnosis not present

## 2019-05-27 DIAGNOSIS — R110 Nausea: Secondary | ICD-10-CM | POA: Diagnosis not present

## 2019-05-27 DIAGNOSIS — R251 Tremor, unspecified: Secondary | ICD-10-CM | POA: Diagnosis not present

## 2019-05-27 DIAGNOSIS — K5909 Other constipation: Secondary | ICD-10-CM

## 2019-05-27 NOTE — Progress Notes (Signed)
Name: Jennifer Byrd   MRN: [id number]    DOB: February 28, 1996   Date:05/27/2019       Progress Note  Subjective  Chief Complaint  Chief Complaint  Patient presents with   New Patient (Initial Visit)   Diarrhea    1x week 1-2x a day.  Has not happened this week. onset 3 weeks   Tremors    in hands x3 days    HPI Pt presents to establish care and for the following:  Social: She lives in [place], but is living with her parents in [place] for now.  Alternating Constipation/Diarrhea: Pt presents with concern for intermittent diarrhea over the last 3 weeks.  She also notes ongoing history of constipation - used to take benefiber, but it caused diarrhea so she stopped taking this about 3-4 weeks ago.  Since then She has not had blood in stool, no abdominal pain, no NV.  She does bikini body building and is following the low FodMap Diet at this time.  Red meat and pasta seem to be triggers; has cut out sugar and dairy.  Eating lean meets - chicken and [place].  Bilateral Hand Tremor - Intermittent: She had tremors in her hands that were evaluated by prior PCP and was told that it was anxiety related.  She notes that this occurred the other day, ate something and this caused the tremor to go away.   Patient Active Problem List   Diagnosis Date Noted   Vitamin D deficiency 10/04/2015   Chronic sore throat 10/04/2015   Hemorrhoidal skin tag 08/23/2015   GERD without esophagitis 08/23/2015   Chronic constipation 08/01/2015   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HCC) 07/22/2015   Oral thrush 07/22/2015   Allergic rhinitis with postnasal drip 07/22/2015   History of Clostridium difficile colitis 07/18/2015   Iron deficiency anemia 07/06/2015   Palpitations 06/26/2015   Acute urticaria 11/17/2014    Past Surgical History:  Procedure Laterality Date   wisdon teeth      Family History  Problem Relation Age of Onset   Diabetes Mother    Hypothyroidism Mother    Diabetes  Father    Hypertension Father    Hyperlipidemia Father    Hypothyroidism Sister     Social History   Socioeconomic History   Marital status: Single    Spouse name: Not on file   Number of children: Not on file   Years of education: 12   Highest education level: Not on file  Occupational History   Occupation: Magazine features editor strain: Not hard at all   Food insecurity:    Worry: Never true    Inability: Never true   Transportation needs:    Medical: No    Non-medical: No  Tobacco Use   Smoking status: Never Smoker   Smokeless tobacco: Never Used  Substance and Sexual Activity   Alcohol use: No    Alcohol/week: 0.0 standard drinks   Drug use: No   Sexual activity: Not Currently    Birth control/protection: None  Lifestyle   Physical activity:    Days per week: 7 days    Minutes per session: 60 min   Stress: Not at all  Relationships   Social connections:    Talks on phone: Three times a week    Gets together: Three times a week    Attends religious service: More than 4 times per year    Active member of club or organization: No  Attends meetings of clubs or organizations: Never    Relationship status: Not on file   Intimate partner violence:    Fear of current or ex partner: No    Emotionally abused: No    Physically abused: No    Forced sexual activity: No  Other Topics Concern   Not on file  Social History Narrative   Not on file     Current Outpatient Medications:    cetirizine (ZYRTEC) 10 MG tablet, Take 10 mg by mouth daily., Disp: , Rfl:    atenolol (TENORMIN) 25 MG tablet, Take by mouth., Disp: , Rfl:    fluticasone (FLONASE) 50 MCG/ACT nasal spray, instill 1 to 2 sprays into each nostril once daily, Disp: , Rfl: 0   omeprazole (PRILOSEC) 40 MG capsule, Take 40 mg by mouth daily. Take 30 to 60 minutes prior to evening meal., Disp: , Rfl:   Allergies  Allergen Reactions   Azithromycin Hives    Cefdinir Hives   Peanut-Containing Drug Products Hives   Latex Hives   Amoxicillin Hives, Rash and Other (See Comments)    Patient states it makes her neck break out. Patient states it makes her neck break out.   Amoxicillin-Pot Clavulanate Rash    I personally reviewed active problem list, medication list, allergies, lab results with the patient/caregiver today.   ROS Constitutional: Negative for fever or weight change.  Respiratory: Negative for cough and shortness of breath.   Cardiovascular: Negative for chest pain or palpitations.  Gastrointestinal: Negative for abdominal pain, no bowel changes.  Musculoskeletal: Negative for gait problem or joint swelling.  Skin: Negative for rash.  Neurological: Negative for dizziness or headache.  No other specific complaints in a complete review of systems (except as listed in HPI above).  Objective  Vitals:   05/27/19 1059  BP: 122/84  Pulse: 87  Resp: 12  Temp: 98 F (36.7 C)  TempSrc: Oral  SpO2: 98%  Weight: 138 lb 4.8 oz (62.7 kg)  Height: 5\' 6"  (1.676 m)    Body mass index is 22.32 kg/m.  Physical Exam Constitutional: Patient appears well-developed and well-nourished. No distress.  HENT: Head: Normocephalic and atraumatic.  Eyes: Conjunctivae and EOM are normal. No scleral icterus.  Neck: Normal range of motion. Neck supple. No JVD present.  Cardiovascular: Normal rate, regular rhythm and normal heart sounds.  No murmur heard. No BLE edema. Pulmonary/Chest: Effort normal and breath sounds normal. No respiratory distress. Abdominal: Soft. Bowel sounds are normal, no distension. There is no tenderness. No masses. Musculoskeletal: Normal range of motion, no joint effusions. No gross deformities Neurological: Pt is alert and oriented to person, place, and time. No cranial nerve deficit. Coordination, balance, strength, speech and gait are normal.  Skin: Skin is warm and dry. No rash noted. No erythema.  Psychiatric:  Patient has a normal mood and affect. behavior is normal. Judgment and thought content normal.  No results found for this or any previous visit (from the past 72 hour(s)).   PHQ2/9: Depression screen [facility] 05/27/2019 07/22/2015  Decreased Interest 0 0  Down, Depressed, Hopeless 0 0  PHQ - 2 Score 0 0  Altered sleeping 3 -  Tired, decreased energy 0 -  Change in appetite 0 -  Feeling bad or failure about yourself  0 -  Trouble concentrating 0 -  Moving slowly or fidgety/restless 0 -  Suicidal thoughts 0 -  PHQ-9 Score 3 -  Difficult doing work/chores Not difficult at all -  PHQ-2/9 Result is negative.    Fall Risk: Fall Risk  05/27/2019 07/22/2015  Falls in the past year? 0 No  Number falls in past yr: 0 -  Injury with Fall? 0 -   Functional Status Survey: Is the patient deaf or have difficulty hearing?: No Does the patient have difficulty seeing, even when wearing glasses/contacts?: No Does the patient have difficulty concentrating, remembering, or making decisions?: No Does the patient have difficulty walking or climbing stairs?: No Does the patient have difficulty dressing or bathing?: No Does the patient have difficulty doing errands alone such as visiting a doctor's office or shopping?: No  Assessment & Plan  1. Intermittent diarrhea - Gastrointestinal Pathogen Panel PCR - COMPLETE METABOLIC PANEL WITH GFR - TSH - CBC w/Diff/Platelet  2. Intermittent constipation - COMPLETE METABOLIC PANEL WITH GFR - TSH - CBC w/Diff/Platelet  3. Tremor of both hands - Advised may be related to BG's dipping too low or anxiety.  Suspect more related to low BG's.  Will return fasting for BG check.  Discussed option to check with glucometer at home, she'd like to hold off on this for now.  - COMPLETE METABOLIC PANEL WITH GFR - TSH - CBC w/Diff/Platelet  4. Nausea - COMPLETE METABOLIC PANEL WITH GFR - TSH - CBC w/Diff/Platelet

## 2019-05-27 NOTE — Patient Instructions (Signed)
Diet for Irritable Bowel Syndrome  When you have irritable bowel syndrome (IBS), it is very important to eat the foods and follow the eating habits that are best for your condition. IBS may cause various symptoms such as pain in the abdomen, constipation, or diarrhea. Choosing the right foods can help to ease the discomfort from these symptoms. Work with your health care provider and diet and nutrition specialist (dietitian) to find the eating plan that will help to control your symptoms.  What are tips for following this plan?          Keep a food diary. This will help you identify foods that cause symptoms. Write down:  ? What you eat and when you eat it.  ? What symptoms you have.  ? When symptoms occur in relation to your meals, such as "pain in abdomen 2 hours after dinner."   Eat your meals slowly and in a relaxed setting.   Aim to eat 5-6 small meals per day. Do not skip meals.   Drink enough fluid to keep your urine pale yellow.   Ask your health care provider if you should take an over-the-counter probiotic to help restore healthy bacteria in your gut (digestive tract).  ? Probiotics are foods that contain good bacteria and yeasts.   Your dietitian may have specific dietary recommendations for you based on your symptoms. He or she may recommend that you:  ? Avoid foods that cause symptoms. Talk with your dietitian about other ways to get the same nutrients that are in those problem foods.  ? Avoid foods with gluten. Gluten is a protein that is found in rye, wheat, and barley.  ? Eat more foods that contain soluble fiber. Examples of foods with high soluble fiber include oats, seeds, and certain fruits and vegetables. Take a fiber supplement if directed by your dietitian.  ? Reduce or avoid certain foods called FODMAPs. These are foods that contain carbohydrates that are hard to digest. Ask your doctor which foods contain these carbohydrates.  What foods are not recommended?  The following are some  foods and drinks that may make your symptoms worse:   Fatty foods, such as french fries.   Foods that contain gluten, such as pasta and cereal.   Dairy products, such as milk, cheese, and ice cream.   Chocolate.   Alcohol.   Products with caffeine, such as coffee.   Carbonated drinks, such as soda.   Foods that are high in FODMAPs. These include certain fruits and vegetables.   Products with sweeteners such as honey, high fructose corn syrup, sorbitol, and mannitol.  The items listed above may not be a complete list of foods and beverages you should avoid. Contact a dietitian for more information.  What foods are good sources of fiber?  Your health care provider or dietitian may recommend that you eat more foods that contain fiber. Fiber can help to reduce constipation and other IBS symptoms. Add foods with fiber to your diet a little at a time so your body can get used to them. Too much fiber at one time might cause gas and swelling of your abdomen. The following are some foods that are good sources of fiber:   Berries, such as raspberries, strawberries, and blueberries.   Tomatoes.   Carrots.   Brown rice.   Oats.   Seeds, such as chia and pumpkin seeds.  The items listed above may not be a complete list of recommended sources of fiber. Contact   your dietitian for more options.  Where to find more information   International Foundation for Functional Gastrointestinal Disorders: www.iffgd.org   National Institute of Diabetes and Digestive and Kidney Diseases: www.niddk.nih.gov  Summary   When you have irritable bowel syndrome (IBS), it is very important to eat the foods and follow the eating habits that are best for your condition.   IBS may cause various symptoms such as pain in the abdomen, constipation, or diarrhea.   Choosing the right foods can help to ease the discomfort that comes from symptoms.   Keep a food diary. This will help you identify foods that cause symptoms.   Your health  care provider or diet and nutrition specialist (dietitian) may recommend that you eat more foods that contain fiber.  This information is not intended to replace advice given to you by your health care provider. Make sure you discuss any questions you have with your health care provider.  Document Released: 03/01/2004 Document Revised: 07/07/2018 Document Reviewed: 08/13/2017  Elsevier Interactive Patient Education  2019 [employer] Inc[employer]      Low-FODMAP Eating Plan    FODMAPs (fermentable oligosaccharides, disaccharides, monosaccharides, and polyols) are sugars that are hard for some people to digest. A low-FODMAP eating plan may help some people who have bowel (intestinal) diseases to manage their symptoms.  This meal plan can be complicated to follow. Work with a diet and nutrition specialist (dietitian) to make a low-FODMAP eating plan that is right for you. A dietitian can make sure that you get enough nutrition from this diet.  What are tips for following this plan?  Reading food labels   Check labels for hidden FODMAPs such as:  ? High-fructose syrup.  ? Honey.  ? Agave.  ? Natural fruit flavors.  ? Onion or garlic powder.   Choose low-FODMAP foods that contain 3-4 grams of fiber per serving.   Check food labels for serving sizes. Eat only one serving at a time to make sure FODMAP levels stay low.  Meal planning   Follow a low-FODMAP eating plan for up to 6 weeks, or as told by your health care provider or dietitian.   To follow the eating plan:  1. Eliminate high-FODMAP foods from your diet completely.  2. Gradually reintroduce high-FODMAP foods into your diet one at a time. Most people should wait a few days after introducing one high-FODMAP food before they introduce the next high-FODMAP food. Your dietitian can recommend how quickly you may reintroduce foods.  3. Keep a daily record of what you eat and drink, and make note of any symptoms that you have after eating.  4. Review your daily record with a  dietitian regularly. Your dietitian can help you identify which foods you can eat and which foods you should avoid.  General tips   Drink enough fluid each day to keep your urine pale yellow.   Avoid processed foods. These often have added sugar and may be high in FODMAPs.   Avoid most dairy products, whole grains, and sweeteners.   Work with a dietitian to make sure you get enough fiber in your diet.  Recommended foods  Grains   Gluten-free grains, such as rice, oats, buckwheat, quinoa, corn, polenta, and millet. Gluten-free pasta, bread, or cereal. Rice noodles. Corn tortillas.  Vegetables   Eggplant, zucchini, cucumber, peppers, green beans, Brussels sprouts, bean sprouts, lettuce, arugula, kale, Swiss chard, spinach, collard greens, bok choy, summer squash, potato, and tomato. Limited amounts of corn, carrot, and sweet   potato. Green parts of scallions.  Fruits   Bananas, oranges, lemons, limes, blueberries, raspberries, strawberries, grapes, cantaloupe, honeydew melon, kiwi, papaya, passion fruit, and pineapple. Limited amounts of dried cranberries, banana chips, and shredded coconut.  Dairy   Lactose-free milk, yogurt, and kefir. Lactose-free cottage cheese and ice cream. Non-dairy milks, such as almond, coconut, hemp, and rice milk. Yogurts made of non-dairy milks. Limited amounts of goat cheese, brie, mozzarella, parmesan, swiss, and other hard cheeses.  Meats and other protein foods   Unseasoned beef, pork, poultry, or fish. Eggs. Bacon. Tofu (firm) and tempeh. Limited amounts of nuts and seeds, such as almonds, walnuts, brazil nuts, pecans, peanuts, pumpkin seeds, chia seeds, and sunflower seeds.  Fats and oils   Butter-free spreads. Vegetable oils, such as olive, canola, and sunflower oil.  Seasoning and other foods   Artificial sweeteners with names that do not end in "ol" such as aspartame, saccharine, and stevia. Maple syrup, white table sugar, raw sugar, brown sugar, and molasses. Fresh  basil, coriander, parsley, rosemary, and thyme.  Beverages   Water and mineral water. Sugar-sweetened soft drinks. Small amounts of orange juice or cranberry juice. Black and green tea. Most dry wines. Coffee.  This may not be a complete list of low-FODMAP foods. Talk with your dietitian for more information.  Foods to avoid  Grains   Wheat, including kamut, durum, and semolina. Barley and bulgur. Couscous. Wheat-based cereals. Wheat noodles, bread, crackers, and pastries.  Vegetables   Chicory root, artichoke, asparagus, cabbage, snow peas, sugar snap peas, mushrooms, and cauliflower. Onions, garlic, leeks, and the white part of scallions.  Fruits   Fresh, dried, and juiced forms of apple, pear, watermelon, peach, plum, cherries, apricots, blackberries, boysenberries, figs, nectarines, and mango. Avocado.  Dairy   Milk, yogurt, ice cream, and soft cheese. Cream and sour cream. Milk-based sauces. Custard.  Meats and other protein foods   Fried or fatty meat. Sausage. Cashews and pistachios. Soybeans, baked beans, black beans, chickpeas, kidney beans, fava beans, navy beans, lentils, and split peas.  Seasoning and other foods   Any sugar-free gum or candy. Foods that contain artificial sweeteners such as sorbitol, mannitol, isomalt, or xylitol. Foods that contain honey, high-fructose corn syrup, or agave. Bouillon, vegetable stock, beef stock, and chicken stock. Garlic and onion powder. Condiments made with onion, such as hummus, chutney, pickles, relish, salad dressing, and salsa. Tomato paste.  Beverages   Chicory-based drinks. Coffee substitutes. Chamomile tea. Fennel tea. Sweet or fortified wines such as port or sherry. Diet soft drinks made with isomalt, mannitol, maltitol, sorbitol, or xylitol. Apple, pear, and mango juice. Juices with high-fructose corn syrup.  This may not be a complete list of high-FODMAP foods. Talk with your dietitian to discuss what dietary choices are best for you.   Summary   A  low-FODMAP eating plan is a short-term diet that eliminates FODMAPs from your diet to help ease symptoms of certain bowel diseases.   The eating plan usually lasts up to 6 weeks. After that, high-FODMAP foods are restarted gradually, one at a time, so you can find out which may be causing symptoms.   A low-FODMAP eating plan can be complicated. It is best to work with a dietitian who has experience with this type of plan.  This information is not intended to replace advice given to you by your health care provider. Make sure you discuss any questions you have with your health care provider.  Document Released: 08/06/2017 Document Revised: 08/06/2017 Document   Reviewed: 08/06/2017  Elsevier Interactive Patient Education  2019 Else[employer] Inc[employer]

## 2019-05-29 LAB — COMPLETE METABOLIC PANEL WITH GFR
AG Ratio: 1.6 (calc) (ref 1.0–2.5)
ALT: 9 U/L (ref 6–29)
AST: 15 U/L (ref 10–30)
Albumin: 4.2 g/dL (ref 3.6–5.1)
Alkaline phosphatase (APISO): 52 U/L (ref 31–125)
BUN: 13 mg/dL (ref 7–25)
CO2: 24 mmol/L (ref 20–32)
Calcium: 9.3 mg/dL (ref 8.6–10.2)
Chloride: 101 mmol/L (ref 98–110)
Creat: 0.88 mg/dL (ref 0.50–1.10)
GFR, Est African American: 107 mL/min/{1.73_m2} (ref >=60)
GFR, Est Non African American: 92 mL/min/{1.73_m2} (ref >=60)
Globulin: 2.7 g/dL (calc) (ref 1.9–3.7)
Glucose, Bld: 85 mg/dL (ref 65–99)
Potassium: 4.1 mmol/L (ref 3.5–5.3)
Sodium: 134 mmol/L — ABNORMAL LOW (ref 135–146)
Total Bilirubin: 0.4 mg/dL (ref 0.2–1.2)
Total Protein: 6.9 g/dL (ref 6.1–8.1)

## 2019-05-29 LAB — CBC WITH DIFFERENTIAL/PLATELET
Absolute Monocytes: 442 cells/uL (ref 200–950)
Basophils Absolute: 41 cells/uL (ref 0–200)
Basophils Relative: 0.9 %
Eosinophils Absolute: 170 cells/uL (ref 15–500)
Eosinophils Relative: 3.7 %
HCT: 42.6 % (ref 35.0–45.0)
Hemoglobin: 13.5 g/dL (ref 11.7–15.5)
Lymphs Abs: 2415 cells/uL (ref 850–3900)
MCH: 25.8 pg — ABNORMAL LOW (ref 27.0–33.0)
MCHC: 31.7 g/dL — ABNORMAL LOW (ref 32.0–36.0)
MCV: 81.3 fL (ref 80.0–100.0)
MPV: 12 fL (ref 7.5–12.5)
Monocytes Relative: 9.6 %
Neutro Abs: 1532 cells/uL (ref 1500–7800)
Neutrophils Relative %: 33.3 %
Platelets: 212 10*3/uL (ref 140–400)
RBC: 5.24 10*6/uL — ABNORMAL HIGH (ref 3.80–5.10)
RDW: 14.5 % (ref 11.0–15.0)
Total Lymphocyte: 52.5 %
WBC: 4.6 10*3/uL (ref 3.8–10.8)

## 2019-05-29 LAB — TSH: TSH: 2.26 mIU/L

## 2019-06-05 LAB — GASTROINTESTINAL PATHOGEN PANEL PCR
C. difficile Tox A/B, PCR: NOT DETECTED
Campylobacter, PCR: NOT DETECTED
Cryptosporidium, PCR: NOT DETECTED
E coli (ETEC) LT/ST PCR: NOT DETECTED
E coli (STEC) stx1/stx2, PCR: NOT DETECTED
E coli 0157, PCR: NOT DETECTED
Giardia lamblia, PCR: NOT DETECTED
Norovirus, PCR: NOT DETECTED
Rotavirus A, PCR: NOT DETECTED
Salmonella, PCR: NOT DETECTED
Shigella, PCR: NOT DETECTED

## 2019-07-06 ENCOUNTER — Emergency Department: Payer: Federal, State, Local not specified - PPO

## 2019-07-06 ENCOUNTER — Telehealth: Payer: Self-pay

## 2019-07-06 ENCOUNTER — Emergency Department
Admission: EM | Admit: 2019-07-06 | Discharge: 2019-07-06 | Disposition: A | Discharge status: Home / Self Care | Payer: Federal, State, Local not specified - PPO | Attending: Student in an Organized Health Care Education/Training Program | Admitting: Student in an Organized Health Care Education/Training Program

## 2019-07-06 ENCOUNTER — Encounter: Payer: Self-pay | Admitting: Emergency Medicine

## 2019-07-06 ENCOUNTER — Other Ambulatory Visit: Payer: Self-pay

## 2019-07-06 DIAGNOSIS — Z79899 Other long term (current) drug therapy: Secondary | ICD-10-CM | POA: Insufficient documentation

## 2019-07-06 DIAGNOSIS — U071 COVID-19: Secondary | ICD-10-CM | POA: Insufficient documentation

## 2019-07-06 DIAGNOSIS — Z91040 Latex allergy status: Secondary | ICD-10-CM | POA: Insufficient documentation

## 2019-07-06 DIAGNOSIS — R1031 Right lower quadrant pain: Secondary | ICD-10-CM | POA: Diagnosis present

## 2019-07-06 DIAGNOSIS — E039 Hypothyroidism, unspecified: Secondary | ICD-10-CM | POA: Insufficient documentation

## 2019-07-06 DIAGNOSIS — I10 Essential (primary) hypertension: Secondary | ICD-10-CM | POA: Insufficient documentation

## 2019-07-06 DIAGNOSIS — R109 Unspecified abdominal pain: Secondary | ICD-10-CM

## 2019-07-06 DIAGNOSIS — E119 Type 2 diabetes mellitus without complications: Secondary | ICD-10-CM | POA: Diagnosis not present

## 2019-07-06 LAB — CBC WITH DIFFERENTIAL/PLATELET
Abs Immature Granulocytes: 0.01 10*3/uL (ref 0.00–0.07)
Basophils Absolute: 0 10*3/uL (ref 0.0–0.1)
Basophils Relative: 1 %
Eosinophils Absolute: 0.1 10*3/uL (ref 0.0–0.5)
Eosinophils Relative: 3 %
HCT: 42 % (ref 36.0–46.0)
Hemoglobin: 13.9 g/dL (ref 12.0–15.0)
Immature Granulocytes: 0 %
Lymphocytes Relative: 54 %
Lymphs Abs: 3 10*3/uL (ref 0.7–4.0)
MCH: 25.6 pg — ABNORMAL LOW (ref 26.0–34.0)
MCHC: 33.1 g/dL (ref 30.0–36.0)
MCV: 77.2 fL — ABNORMAL LOW (ref 80.0–100.0)
Monocytes Absolute: 0.4 10*3/uL (ref 0.1–1.0)
Monocytes Relative: 8 %
Neutro Abs: 1.8 10*3/uL (ref 1.7–7.7)
Neutrophils Relative %: 34 %
Platelets: 245 10*3/uL (ref 150–400)
RBC: 5.44 MIL/uL — ABNORMAL HIGH (ref 3.87–5.11)
RDW: 14.6 % (ref 11.5–15.5)
WBC: 5.4 10*3/uL (ref 4.0–10.5)
nRBC: 0 % (ref 0.0–0.2)

## 2019-07-06 LAB — URINALYSIS, COMPLETE (UACMP) WITH MICROSCOPIC
Bilirubin Urine: NEGATIVE
Glucose, UA: NEGATIVE mg/dL
Hgb urine dipstick: NEGATIVE
Ketones, ur: NEGATIVE mg/dL
Leukocytes,Ua: NEGATIVE
Nitrite: NEGATIVE
Protein, ur: NEGATIVE mg/dL
Specific Gravity, Urine: 1.01 (ref 1.005–1.030)
pH: 5 (ref 5.0–8.0)

## 2019-07-06 LAB — COMPREHENSIVE METABOLIC PANEL
ALT: 12 U/L (ref 0–44)
AST: 16 U/L (ref 15–41)
Albumin: 4.3 g/dL (ref 3.5–5.0)
Alkaline Phosphatase: 50 U/L (ref 38–126)
Anion gap: 9 (ref 5–15)
BUN: 11 mg/dL (ref 6–20)
CO2: 24 mmol/L (ref 22–32)
Calcium: 9.2 mg/dL (ref 8.9–10.3)
Chloride: 103 mmol/L (ref 98–111)
Creatinine, Ser: 0.77 mg/dL (ref 0.44–1.00)
GFR calc Af Amer: >60 mL/min (ref >60)
GFR calc non Af Amer: >60 mL/min (ref >60)
Glucose, Bld: 99 mg/dL (ref 70–99)
Potassium: 3.3 mmol/L — ABNORMAL LOW (ref 3.5–5.1)
Sodium: 136 mmol/L (ref 135–145)
Total Bilirubin: 0.7 mg/dL (ref 0.3–1.2)
Total Protein: 7.6 g/dL (ref 6.5–8.1)

## 2019-07-06 LAB — POCT PREGNANCY, URINE: Preg Test, Ur: NEGATIVE

## 2019-07-06 IMAGING — CT CT ABDOMEN AND PELVIS WITH CONTRAST
2 of 4 series · 16 of 46 positions shown, 18 images · IV contrast (omnipaque)
Comparison: [DATE]

CLINICAL DATA: Recent coronavirus infection and now with right low
back pain.

EXAM:
CT ABDOMEN AND PELVIS WITH CONTRAST
TECHNIQUE: Multidetector CT imaging of the abdomen and pelvis was performed
using the standard protocol following bolus administration of
intravenous contrast.
CONTRAST:  75mL OMNIPAQUE IOHEXOL 300 MG/ML  SOLN

[Series 2: routine abd/pel wo · axial · 0.67mm/px · z∈[-1016,-626]mm · 13 of 86 slices shown, 15 images]
[im 4/86  soft-tissue]
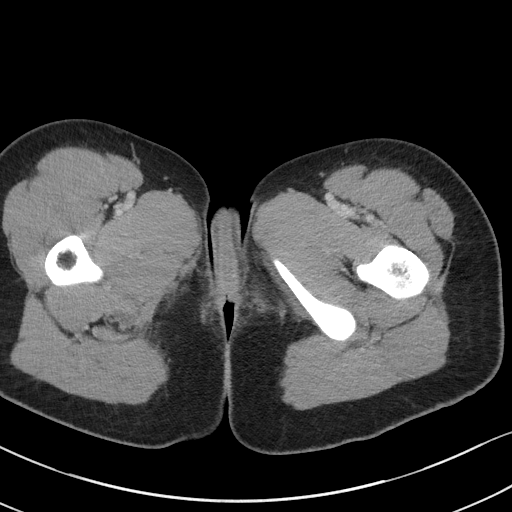
[im 4/86  bone]
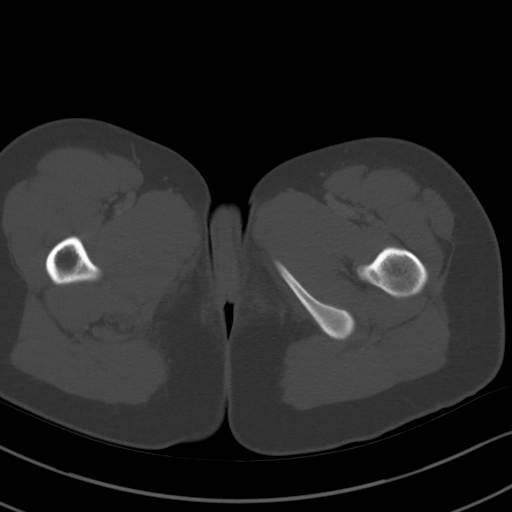
[im 11/86  soft-tissue]
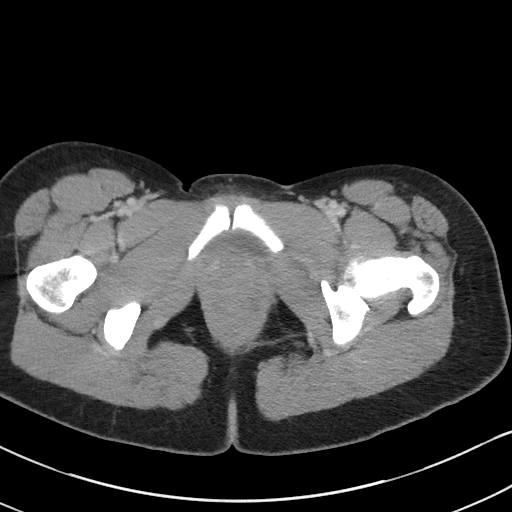
[im 18/86  soft-tissue]
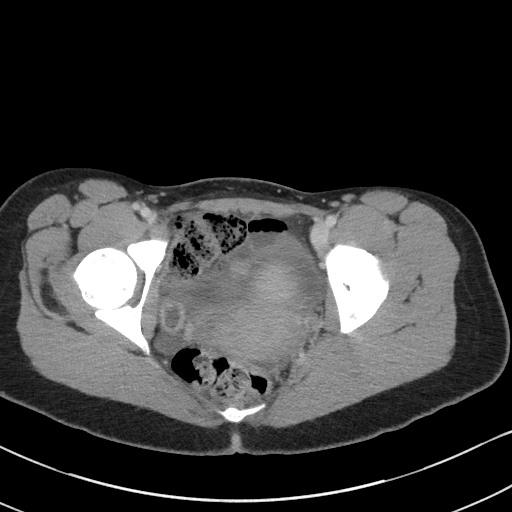
[im 24/86  soft-tissue]
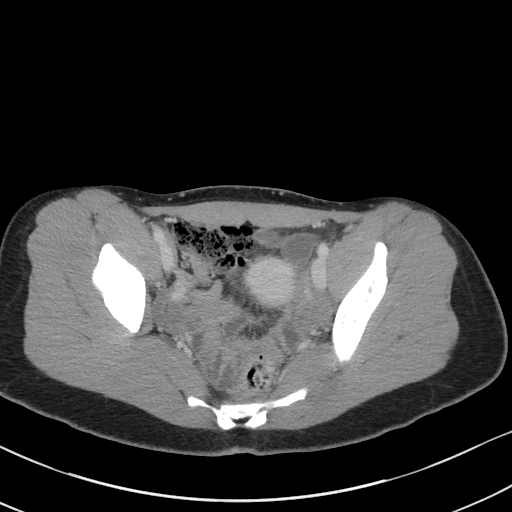
[im 31/86  soft-tissue]
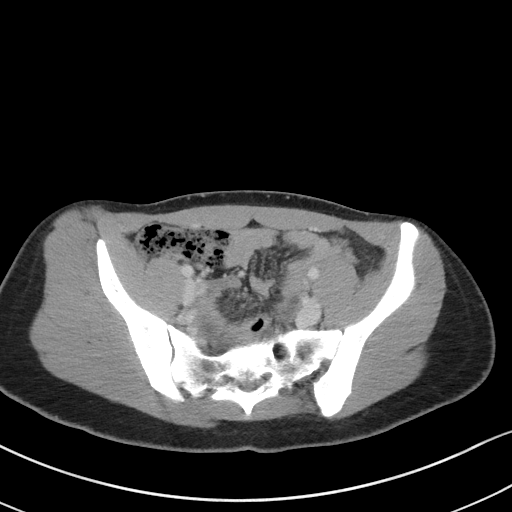
[im 38/86  soft-tissue]
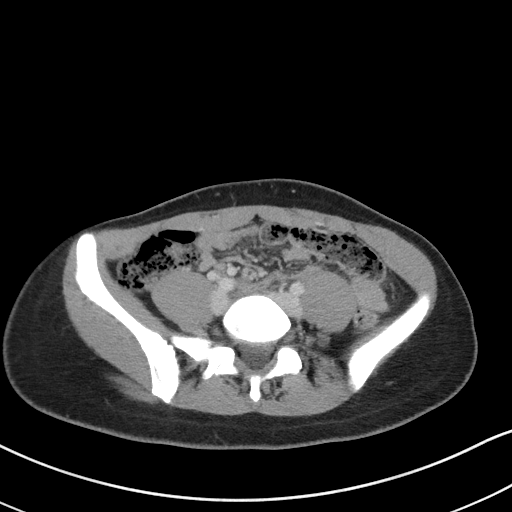
[im 45/86  soft-tissue]
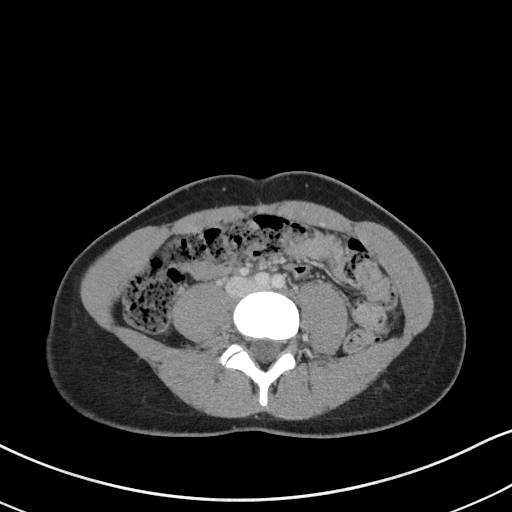
[im 48/86  soft-tissue]
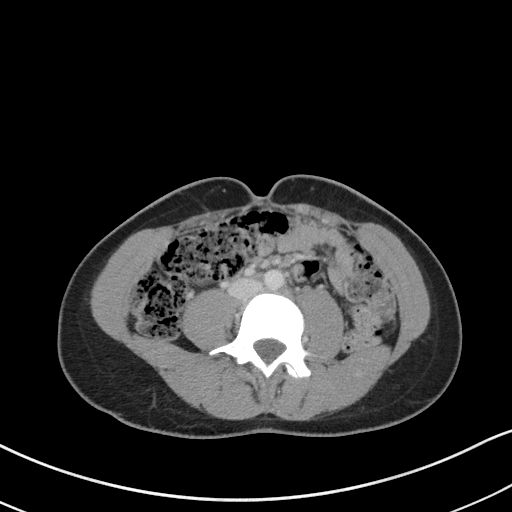
[im 55/86  soft-tissue]
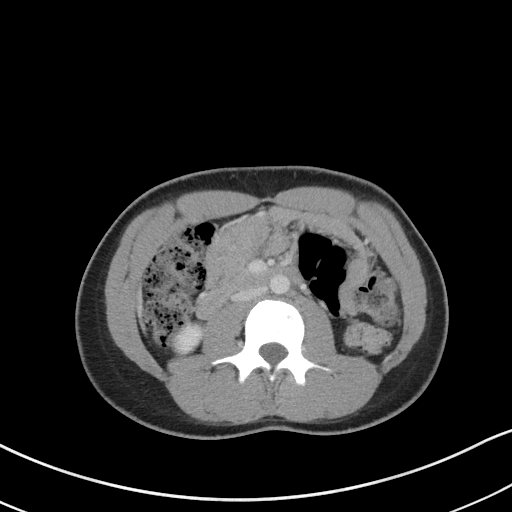
[im 55/86  bone]
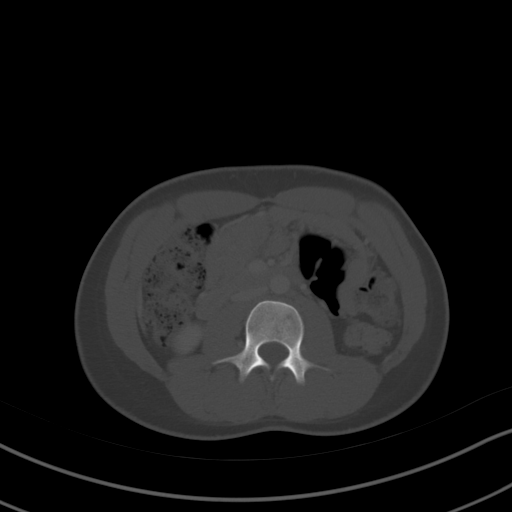
[im 62/86  soft-tissue]
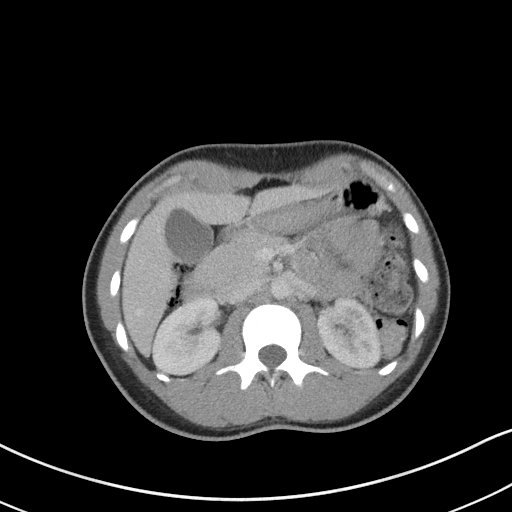
[im 69/86  soft-tissue]
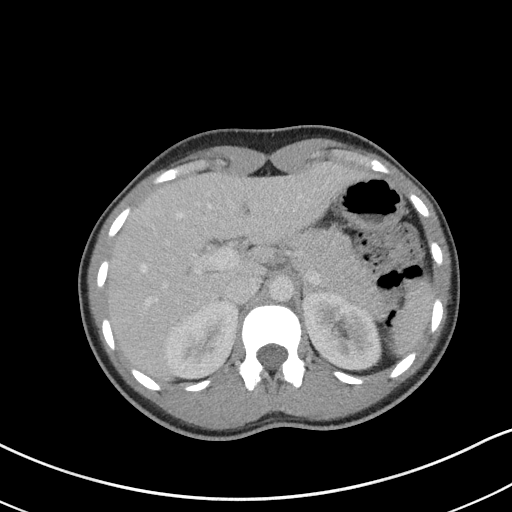
[im 75/86  soft-tissue]
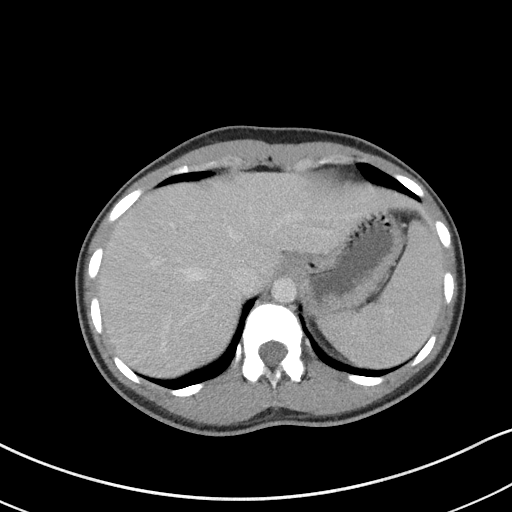
[im 82/86  soft-tissue]
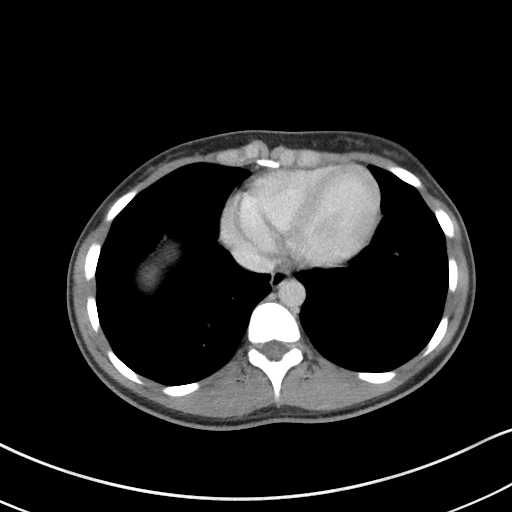

[Series 5: coronal st · coronal · 0.60mm/px · 3 of 72 slices shown]
[im 24/72  soft-tissue]
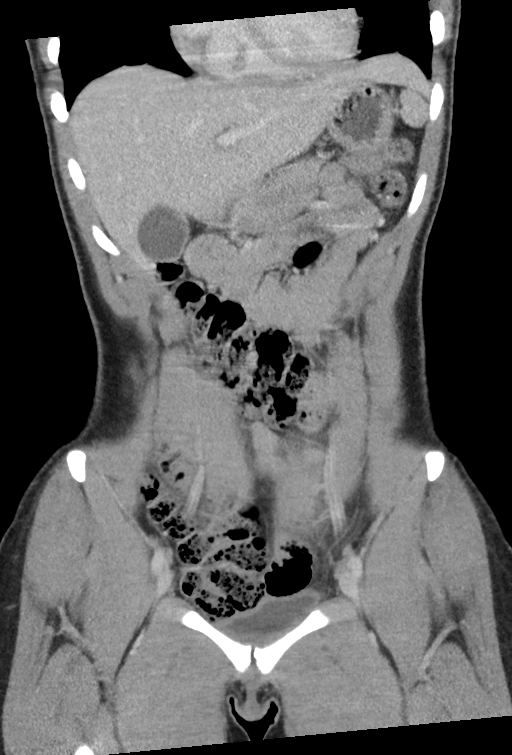
[im 32/72  soft-tissue]
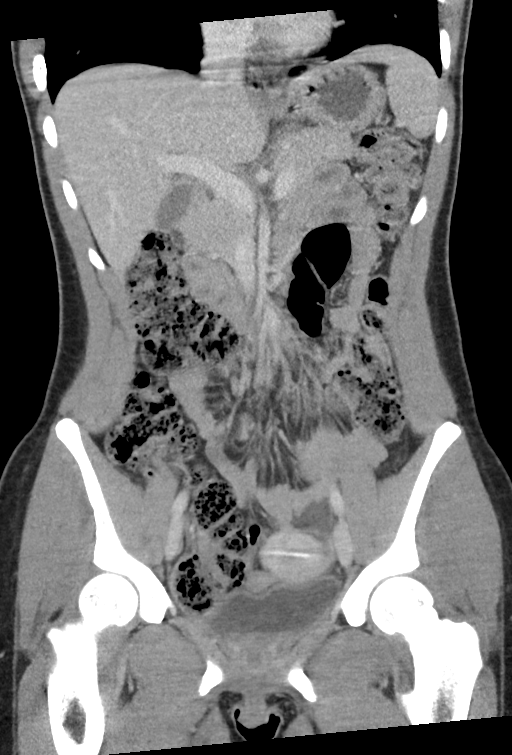
[im 40/72  soft-tissue]
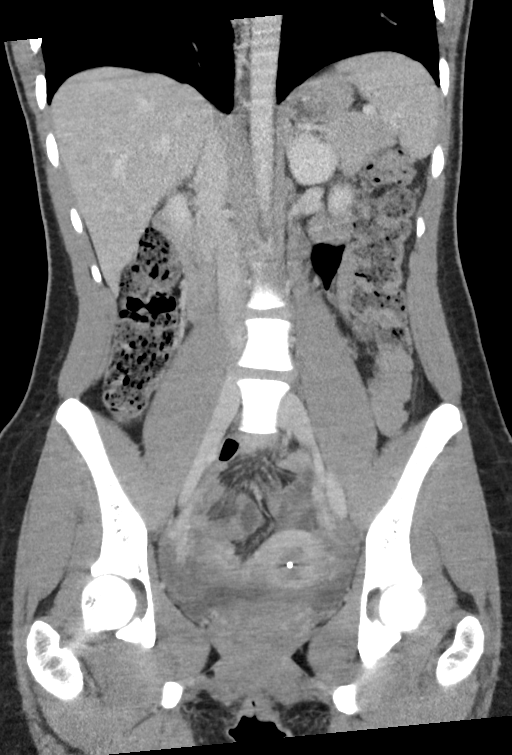

[16 of 46 positions shown; findings below may reference images not displayed]

FINDINGS: Lower chest: Normal.

Hepatobiliary: Gallbladder, liver and biliary tree are normal.

Pancreas: Normal.

Spleen: Normal.

Adrenals/Urinary Tract: Adrenal glands are normal. Kidneys are
normal in size without hydronephrosis or nephrolithiasis. No
perinephric inflammation or fluid. Ureters and bladder are
unremarkable.

Stomach/Bowel: Stomach and small bowel are normal. Appendix is
normal. Relative narrowing of the sigmoid colon over the midline
upper pelvis as the colon is otherwise unremarkable.

Vascular/Lymphatic: Vascular structures are normal. There is no
adenopathy.

Reproductive: Interval placement of IUD in adequate position. Uterus
is just left of midline. Left ovary is unremarkable. 2.2 cm dominant
follicle or cyst over the right ovary. Small amount of free pelvic
fluid.

Other: No free peritoneal air or focal inflammatory change.

Musculoskeletal: Subtle focal sclerosis over the subchondral
posterior aspect of the left femoral head.
IMPRESSION: No acute findings in the abdomen/pelvis.

Small amount of free pelvic fluid likely physiologic.

IUD in adequate position.

Subtle sclerosis along the subchondral posterior aspect of the left
femoral head which could be seen with early avascular necrosis.

## 2019-07-06 MED ORDER — IOHEXOL 300 MG/ML  SOLN
75.0000 mL | Freq: Once | INTRAMUSCULAR | Status: AC | PRN
  Administered 2019-07-06: 07:00:00 75 mL via INTRAVENOUS

## 2019-07-06 MED ORDER — HYDROCODONE-ACETAMINOPHEN 5-325 MG PO TABS: 1.0000 | ORAL_TABLET | Freq: Once | ORAL | Status: DC

## 2019-07-06 MED ORDER — SODIUM CHLORIDE 0.9 % IV BOLUS: 500.0000 mL | Freq: Once | INTRAVENOUS | Status: DC

## 2019-07-06 NOTE — Telephone Encounter (Signed)
Patient's mother called stating patient unable to ambulate to car to get to ED for evaluation of back pain/cramps pain level 10/10. Covid-19 positive. Instructed mother to call EMS.

## 2019-07-06 NOTE — ED Notes (Signed)
Pt going to CT at this time.

## 2019-07-06 NOTE — Discharge Instructions (Addendum)
Your lab results and CT scan of the abdomen were okay today.  Your CT scan shows a minor incidental finding, which does not appear to be related to any underlying disease.  If you develop long standing pain in the left hip, you can discuss with your doctor if seeing an orthopedic doctor is needed.  CT report summary: Subtle sclerosis along the subchondral posterior aspect of the left  femoral head which could be seen with early avascular necrosis.

## 2019-07-06 NOTE — ED Provider Notes (Signed)
[facility], [facility] Emergency Department Provider Note    First MD Initiated Contact with Patient 07/06/19 0542     (approximate)  I have reviewed the triage vital signs and the nursing notes.   HISTORY  Chief Complaint Flank Pain    HPI Jennifer Byrd is a 23 y.o. female close past medical history presents the ER after recent diagnosis of coronavirus for evaluation of right low back pain.  Not having nausea vomiting or diarrhea.  Is noticed some change in color of her urine but denies any dysuria or increased urinary frequency.  Denies any cough or shortness of breath.  Pain is worse with movement and palpation.  Denies any headache or neck pain.  Is never had pain like this before.    Past Medical History:  Diagnosis Date  . Acid reflux   . Allergy   . Anemia   . Anxiety   . Heart palpitations   . Strep throat    Family History  Problem Relation Age of Onset  . Diabetes Mother   . Hypothyroidism Mother   . Diabetes Father   . Hypertension Father   . Hyperlipidemia Father   . Hypothyroidism Sister    Past Surgical History:  Procedure Laterality Date  . wisdon teeth     Patient Active Problem List   Diagnosis Date Noted  . Vitamin D deficiency 10/04/2015  . Chronic sore throat 10/04/2015  . Hemorrhoidal skin tag 08/23/2015  . GERD without esophagitis 08/23/2015  . Chronic constipation 08/01/2015  . 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HCC) 07/22/2015  . Oral thrush 07/22/2015  . Allergic rhinitis with postnasal drip 07/22/2015  . History of Clostridium difficile colitis 07/18/2015  . Iron deficiency anemia 07/06/2015  . Palpitations 06/26/2015  . Acute urticaria 11/17/2014      Prior to Admission medications   Medication Sig Start Date End Date Taking? Authorizing Provider  cetirizine (ZYRTEC) 10 MG tablet Take 10 mg by mouth daily.    [provider]    Allergies Azithromycin, Cefdinir, Peanut-containing drug products, Latex,  Amoxicillin, and Amoxicillin-pot clavulanate    Social History Social History   Tobacco Use  . Smoking status: Never Smoker  . Smokeless tobacco: Never Used  Substance Use Topics  . Alcohol use: No  . Drug use: No    Review of Systems Patient denies headaches, rhinorrhea, blurry vision, numbness, shortness of breath, chest pain, edema, cough, abdominal pain, nausea, vomiting, diarrhea, dysuria, fevers, rashes or hallucinations unless otherwise stated above in HPI. ____________________________________________   PHYSICAL EXAM:  VITAL SIGNS: Vitals:   07/06/19 0536  BP: (!) 112/91  Pulse: (!) 101  Resp: 18  Temp: 98.8 F (37.1 C)  SpO2: 100%    Constitutional: Alert and oriented.  Eyes: Conjunctivae are normal.  Head: Atraumatic. Nose: No congestion/rhinnorhea. Mouth/Throat: Mucous membranes are moist.   Neck: No stridor. Painless ROM.  Cardiovascular: Normal rate, regular rhythm. Grossly normal heart sounds.  Good peripheral circulation. Respiratory: Normal respiratory effort.  No retractions. Lungs CTAB. Gastrointestinal: Soft with mild ttp in rlq. No distention. No abdominal bruits. + right CVA tenderness. Genitourinary: deferred Musculoskeletal: No lower extremity tenderness nor edema.  No joint effusions. Neurologic:  Normal speech and language. No gross focal neurologic deficits are appreciated. No facial droop Skin:  Skin is warm, dry and intact. No rash noted. Psychiatric: Mood and affect are normal. Speech and behavior are normal.  ____________________________________________   LABS (all labs ordered are listed, but only abnormal results are  displayed)  Results for orders placed or performed during the hospital encounter of 07/06/19 (from the past 24 hour(s))  CBC with Differential/Platelet     Status: Abnormal   Collection Time: 07/06/19  5:48 AM  Result Value Ref Range   WBC 5.4 4.0 - 10.5 K/uL   RBC 5.44 (H) 3.87 - 5.11 MIL/uL   Hemoglobin 13.9 12.0  - 15.0 g/dL   HCT 42.0 36.0 - 46.0 %   MCV 77.2 (L) 80.0 - 100.0 fL   MCH 25.6 (L) 26.0 - 34.0 pg   MCHC 33.1 30.0 - 36.0 g/dL   RDW 14.6 11.5 - 15.5 %   Platelets 245 150 - 400 K/uL   nRBC 0.0 0.0 - 0.2 %   Neutrophils Relative % 34 %   Neutro Abs 1.8 1.7 - 7.7 K/uL   Lymphocytes Relative 54 %   Lymphs Abs 3.0 0.7 - 4.0 K/uL   Monocytes Relative 8 %   Monocytes Absolute 0.4 0.1 - 1.0 K/uL   Eosinophils Relative 3 %   Eosinophils Absolute 0.1 0.0 - 0.5 K/uL   Basophils Relative 1 %   Basophils Absolute 0.0 0.0 - 0.1 K/uL   Immature Granulocytes 0 %   Abs Immature Granulocytes 0.01 0.00 - 0.07 K/uL  Comprehensive metabolic panel     Status: Abnormal   Collection Time: 07/06/19  5:48 AM  Result Value Ref Range   Sodium 136 135 - 145 mmol/L   Potassium 3.3 (L) 3.5 - 5.1 mmol/L   Chloride 103 98 - 111 mmol/L   CO2 24 22 - 32 mmol/L   Glucose, Bld 99 70 - 99 mg/dL   BUN 11 6 - 20 mg/dL   Creatinine, Ser 0.77 0.44 - 1.00 mg/dL   Calcium 9.2 8.9 - 10.3 mg/dL   Total Protein 7.6 6.5 - 8.1 g/dL   Albumin 4.3 3.5 - 5.0 g/dL   AST 16 15 - 41 U/L   ALT 12 0 - 44 U/L   Alkaline Phosphatase 50 38 - 126 U/L   Total Bilirubin 0.7 0.3 - 1.2 mg/dL   GFR calc non Af Amer >60 >60 mL/min   GFR calc Af Amer >60 >60 mL/min   Anion gap 9 5 - 15  Urinalysis, Complete w Microscopic     Status: Abnormal   Collection Time: 07/06/19  5:48 AM  Result Value Ref Range   Color, Urine YELLOW (A) YELLOW   APPearance CLEAR (A) CLEAR   Specific Gravity, Urine 1.010 1.005 - 1.030   pH 5.0 5.0 - 8.0   Glucose, UA NEGATIVE NEGATIVE mg/dL   Hgb urine dipstick NEGATIVE NEGATIVE   Bilirubin Urine NEGATIVE NEGATIVE   Ketones, ur NEGATIVE NEGATIVE mg/dL   Protein, ur NEGATIVE NEGATIVE mg/dL   Nitrite NEGATIVE NEGATIVE   Leukocytes,Ua NEGATIVE NEGATIVE   WBC, UA 0-5 0 - 5 WBC/hpf   Bacteria, UA RARE (A) NONE SEEN   Squamous Epithelial / LPF 0-5 0 - 5   Mucus PRESENT   Pregnancy, urine POC     Status:  None   Collection Time: 07/06/19  6:13 AM  Result Value Ref Range   Preg Test, Ur NEGATIVE NEGATIVE   ____________________________________________ ____________________________________________  RADIOLOGY  I personally reviewed all radiographic images ordered to evaluate for the above acute complaints and reviewed radiology reports and findings.  These findings were personally discussed with the patient.  Please see medical record for radiology report.  ____________________________________________   PROCEDURES  Procedure(s) performed:  Procedures  Critical Care performed: no ____________________________________________   INITIAL IMPRESSION / ASSESSMENT AND PLAN / ED COURSE  Pertinent labs & imaging results that were available during my care of the patient were reviewed by me and considered in my medical decision making (see chart for details).   DDX: appendicitis, stone, cystiit,s msk strain, colitis, cyst,  Jennifer Byrd is a 23 y.o. who presents to the ED with abdominal pain as described above.  Patient anxious but nontoxic-appearing.  Does have mild tenderness palpation right lower quadrant.  She is coronavirus positive but not really having any signs or symptoms of pneumonia.  No respiratory symptoms.  Not clinically consistent with PE.  Doubt torsion or TOA based on symptoms.  Possible stone.  Will order CT imaging to exclude appendicitis.  Clinical Course as of Jul 05 652  Mon Jul 06, 2019  [id number] Blood work thus far is reassuring.  Patient will be signed out to oncoming physician pending results of CT imaging.  Disposition pending CT.   [PR]    Clinical Course User Index [PR] Willy Eddyobinson, Oneda Duffett, MD    The patient was evaluated in Emergency Department today for the symptoms described in the history of present illness. He/she was evaluated in the context of the global COVID-19 pandemic, which necessitated consideration that the patient might be at risk for  infection with the SARS-CoV-2 virus that causes COVID-19. Institutional protocols and algorithms that pertain to the evaluation of patients at risk for COVID-19 are in a state of rapid change based on information released by regulatory bodies including the CDC and federal and state organizations. These policies and algorithms were followed during the patient's care in the ED.  As part of my medical decision making, I reviewed the following data within the electronic MEDICAL RECORD NUMBER Nursing notes reviewed and incorporated, Labs reviewed, notes from prior ED visits and [place] Controlled Substance Database   ____________________________________________   FINAL CLINICAL IMPRESSION(S) / ED DIAGNOSES  Final diagnoses:  Right flank pain      NEW MEDICATIONS STARTED DURING THIS VISIT:  New Prescriptions   No medications on file     Note:  This document was prepared using Dragon voice recognition software and may include unintentional dictation errors.    Willy Eddyobinson, Ladye Macnaughton, MD 07/06/19 [phone]

## 2019-07-06 NOTE — ED Triage Notes (Signed)
Patient with complaint of right lower back pain that started on July 2nd. Patient states that over the last 2 days the pain has become worse. Patient states that the pain woke her up out of her sleep and that it is moving to her lower abdomen. Patient states that she was diagnosed with covid on Saturday.

## 2019-07-06 NOTE — Telephone Encounter (Signed)
Received call from patient's mother reporting cramps in her back.  Patient reports pain 10/10.  Boyce informed that patient in route to ED in a black [employer].

## 2019-07-16 ENCOUNTER — Telehealth: Payer: Self-pay | Admitting: Family Medicine

## 2019-07-16 NOTE — Telephone Encounter (Signed)
Please offer her a virtual appointment and we can discuss birth control options.  If she is having acute pain and/or acute bleeding, then she will need to see GYN or go to UC. Please find out the exact date that she started having symptoms of COVID-19.  It is our clinic's policy to hold off for 30 days from the date of symptom onset to in-person visit to ensure the safety of our staff and other patients.

## 2019-07-16 NOTE — Telephone Encounter (Signed)
FYI

## 2019-07-16 NOTE — Telephone Encounter (Signed)
FYI: Pt was on the schedule for birthcontrol for tomorrow. When I called to do the covid screening she informed me that she did test positive a few weeks back (less than a month ago). I informed her that this appt would have to be a virtual visit. She stated that she was confused because the health depart cleared her and she was told that she does not have to tell anyone that she tested positive. I told her that it is [facility] policy that a pt can be seen 30 days after the dx. I then offered her an appt for later on. She did not take that appt. She then she called back crying saying that she really needed to be seen that she had gotten an iud in [place] and now she is in a lot of pain and having bleeding. I spoke with miel and she told me to suggest that pt go to urgent care because we do not do iud here. Pt was informed and she said when she called she asked if we did birthcontrol and she was told yes. I told her that we do that we just do not do IUD and I did suggest for her to go to urgent care. Pt still seemed confused when we hung up.

## 2019-07-16 NOTE — Telephone Encounter (Signed)
Pt wants IUD so will see her GYN

## 2019-07-17 ENCOUNTER — Ambulatory Visit: Payer: Federal, State, Local not specified - PPO | Admitting: Family Medicine

## 2019-07-21 ENCOUNTER — Ambulatory Visit: Payer: Federal, State, Local not specified - PPO | Admitting: Family Medicine

## 2019-12-18 ENCOUNTER — Emergency Department: Payer: Federal, State, Local not specified - PPO

## 2019-12-18 ENCOUNTER — Other Ambulatory Visit: Payer: Self-pay

## 2019-12-18 ENCOUNTER — Emergency Department
Admission: EM | Admit: 2019-12-18 | Discharge: 2019-12-19 | Disposition: A | Discharge status: Home / Self Care | Payer: Federal, State, Local not specified - PPO | Attending: Emergency Medicine | Admitting: Emergency Medicine

## 2019-12-18 DIAGNOSIS — R8290 Unspecified abnormal findings in urine: Secondary | ICD-10-CM | POA: Insufficient documentation

## 2019-12-18 DIAGNOSIS — R0781 Pleurodynia: Secondary | ICD-10-CM | POA: Insufficient documentation

## 2019-12-18 DIAGNOSIS — Z79899 Other long term (current) drug therapy: Secondary | ICD-10-CM | POA: Diagnosis not present

## 2019-12-18 DIAGNOSIS — Y92410 Unspecified street and highway as the place of occurrence of the external cause: Secondary | ICD-10-CM | POA: Insufficient documentation

## 2019-12-18 DIAGNOSIS — S069X1A Unspecified intracranial injury with loss of consciousness of 30 minutes or less, initial encounter: Secondary | ICD-10-CM

## 2019-12-18 DIAGNOSIS — S069X9A Unspecified intracranial injury with loss of consciousness of unspecified duration, initial encounter: Secondary | ICD-10-CM | POA: Insufficient documentation

## 2019-12-18 DIAGNOSIS — Y9389 Activity, other specified: Secondary | ICD-10-CM | POA: Diagnosis not present

## 2019-12-18 DIAGNOSIS — Y998 Other external cause status: Secondary | ICD-10-CM | POA: Insufficient documentation

## 2019-12-18 DIAGNOSIS — Z91040 Latex allergy status: Secondary | ICD-10-CM | POA: Insufficient documentation

## 2019-12-18 DIAGNOSIS — Z91010 Allergy to peanuts: Secondary | ICD-10-CM | POA: Diagnosis not present

## 2019-12-18 DIAGNOSIS — S0990XA Unspecified injury of head, initial encounter: Secondary | ICD-10-CM | POA: Diagnosis present

## 2019-12-18 MED ORDER — ONDANSETRON 4 MG PO TBDP
4.0000 mg | ORAL_TABLET | Freq: Once | ORAL | Status: AC
  Administered 2019-12-19: 4 mg via ORAL
  Filled 2019-12-18: qty 1

## 2019-12-18 MED ORDER — ACETAMINOPHEN 325 MG PO TABS
650.0000 mg | ORAL_TABLET | Freq: Four times a day (QID) | ORAL | Status: DC | PRN
  Administered 2019-12-19: 650 mg via ORAL
  Filled 2019-12-18: qty 2
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
## 2019-12-19 ENCOUNTER — Encounter: Payer: Self-pay | Admitting: Emergency Medicine

## 2019-12-19 ENCOUNTER — Encounter: Payer: Self-pay | Admitting: Family Medicine

## 2019-12-19 ENCOUNTER — Other Ambulatory Visit: Payer: Self-pay

## 2019-12-19 ENCOUNTER — Emergency Department: Payer: Federal, State, Local not specified - PPO

## 2019-12-19 DIAGNOSIS — G44319 Acute post-traumatic headache, not intractable: Secondary | ICD-10-CM | POA: Insufficient documentation

## 2019-12-19 DIAGNOSIS — F0781 Postconcussional syndrome: Secondary | ICD-10-CM | POA: Insufficient documentation

## 2019-12-19 LAB — URINALYSIS, COMPLETE (UACMP) WITH MICROSCOPIC
Bilirubin Urine: NEGATIVE
Glucose, UA: NEGATIVE mg/dL
Hgb urine dipstick: NEGATIVE
Ketones, ur: NEGATIVE mg/dL
Leukocytes,Ua: NEGATIVE
Nitrite: NEGATIVE
Protein, ur: 30 mg/dL — AB
Specific Gravity, Urine: 1.01 (ref 1.005–1.030)
pH: 5 (ref 5.0–8.0)

## 2019-12-19 LAB — GLUCOSE, CAPILLARY: Glucose-Capillary: 81 mg/dL (ref 70–99)
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
## 2019-12-19 MED ORDER — CYCLOBENZAPRINE HCL 5 MG PO TABS: 5.0000 mg | ORAL_TABLET | Freq: Three times a day (TID) | ORAL | 0 refills | Status: AC | PRN

## 2019-12-19 MED ORDER — ONDANSETRON 4 MG PO TBDP: 4.0000 mg | ORAL_TABLET | Freq: Three times a day (TID) | ORAL | 0 refills | Status: AC | PRN
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
## 2019-12-19 NOTE — ED Notes (Signed)
Pt to nurse's station c/o feeling like heart racing and is wanting something to eat. Vital signs checked at this time as well as blood sugar.  Informed patient that she cannot eat anything at this time.  Family also called ED prior to this time asking for staff to feed patient as she had not had anything to eat today.
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
## 2019-12-20 ENCOUNTER — Emergency Department
Admission: EM | Admit: 2019-12-20 | Discharge: 2019-12-20 | Disposition: A | Discharge status: Home / Self Care | Payer: Federal, State, Local not specified - PPO | Source: Home / Self Care | Attending: Emergency Medicine | Admitting: Emergency Medicine

## 2019-12-20 DIAGNOSIS — F0781 Postconcussional syndrome: Secondary | ICD-10-CM

## 2019-12-20 MED ORDER — SODIUM CHLORIDE 0.9 % IV BOLUS
1000.0000 mL | Freq: Once | INTRAVENOUS | Status: AC
  Administered 2019-12-20: 1000 mL via INTRAVENOUS

## 2019-12-20 MED ORDER — METOCLOPRAMIDE HCL 5 MG/ML IJ SOLN
10.0000 mg | Freq: Once | INTRAMUSCULAR | Status: AC
  Administered 2019-12-20: 10 mg via INTRAVENOUS
  Filled 2019-12-20: qty 2

## 2019-12-20 MED ORDER — KETOROLAC TROMETHAMINE 30 MG/ML IJ SOLN
15.0000 mg | Freq: Once | INTRAMUSCULAR | Status: AC
  Administered 2019-12-20: 01:00:00 15 mg via INTRAVENOUS
  Filled 2019-12-20: qty 1

## 2019-12-20 NOTE — ED Notes (Signed)
Patient given saltine crackers at this time. 

## 2019-12-20 NOTE — ED Notes (Signed)
Patient states that she is feeling shaky and request that the IV fluids be stopped. IV fluids paused at this time per patient request and Dr. Alfred Levins notified.
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
## 2019-12-20 NOTE — Discharge Instructions (Addendum)
For pain take ibuprofen 600mg  every 6 hours or tylenol 1000mg  every 8 hours.

## 2019-12-20 NOTE — ED Notes (Signed)
ED Provider at bedside. 

## 2019-12-20 NOTE — ED Notes (Signed)
Asked patient if she would like the fluids restarted. Patient states that she does not that she is feeling better and that she will just drink water.

## 2019-12-20 NOTE — ED Notes (Signed)
Patient resting with eyes closed in no acute distress.

## 2019-12-21 ENCOUNTER — Ambulatory Visit: Payer: Self-pay | Admitting: *Deleted

## 2019-12-21 NOTE — Telephone Encounter (Signed)
Agree with documentation.

## 2019-12-21 NOTE — Telephone Encounter (Signed)
Pt called inquiring about Tylenol 1000 mg q 8 hrs; she would like to know if it is the same as taking 500 mg q 4 hrs; explained this is the equivalent dose; pt also advised not to exceed 3,000 mg (6 tablets) in a 24 hr period; she verbalized understanding; the pt sees Raelyn Ensign, [place], will route to office for notification.  Reason for Disposition . Caller has medication question only, adult not sick, and triager answers question  Answer Assessment - Initial Assessment Questions 1.   NAME of MEDICATION: "What medicine are you calling about?"     Tylenol 2.   QUESTION: "What is your question?"    doses 3.   PRESCRIBING HCP: "Who prescribed it?" Reason: if prescribed by specialist, call should be referred to that group.    OTC 4. SYMPTOMS: "Do you have any symptoms?"      5. SEVERITY: If symptoms are present, ask "Are they mild, moderate or severe?"  6.  PREGNANCY:  "Is there any chance that you are pregnant?" "When was your last menstrual period?"  Protocols used: MEDICATION QUESTION CALL-A-AH

## 2019-12-21 NOTE — Telephone Encounter (Signed)
FYI

## 2019-12-23 ENCOUNTER — Emergency Department
Admission: EM | Admit: 2019-12-23 | Discharge: 2019-12-23 | Disposition: A | Discharge status: Home / Self Care | Payer: Federal, State, Local not specified - PPO | Attending: Emergency Medicine | Admitting: Emergency Medicine

## 2019-12-23 ENCOUNTER — Other Ambulatory Visit: Payer: Self-pay

## 2019-12-23 ENCOUNTER — Encounter: Payer: Self-pay | Admitting: Emergency Medicine

## 2019-12-23 DIAGNOSIS — N939 Abnormal uterine and vaginal bleeding, unspecified: Secondary | ICD-10-CM | POA: Insufficient documentation

## 2019-12-23 DIAGNOSIS — N760 Acute vaginitis: Secondary | ICD-10-CM | POA: Insufficient documentation

## 2019-12-23 DIAGNOSIS — B9689 Other specified bacterial agents as the cause of diseases classified elsewhere: Secondary | ICD-10-CM | POA: Insufficient documentation

## 2019-12-23 DIAGNOSIS — Z79899 Other long term (current) drug therapy: Secondary | ICD-10-CM | POA: Diagnosis not present

## 2019-12-23 LAB — COMPREHENSIVE METABOLIC PANEL
ALT: 12 U/L (ref 0–44)
AST: 17 U/L (ref 15–41)
Albumin: 4.3 g/dL (ref 3.5–5.0)
Alkaline Phosphatase: 50 U/L (ref 38–126)
Anion gap: 9 (ref 5–15)
BUN: 11 mg/dL (ref 6–20)
CO2: 25 mmol/L (ref 22–32)
Calcium: 9.4 mg/dL (ref 8.9–10.3)
Chloride: 104 mmol/L (ref 98–111)
Creatinine, Ser: 0.88 mg/dL (ref 0.44–1.00)
GFR calc Af Amer: >60 mL/min (ref >60)
GFR calc non Af Amer: >60 mL/min (ref >60)
Glucose, Bld: 101 mg/dL — ABNORMAL HIGH (ref 70–99)
Potassium: 3.9 mmol/L (ref 3.5–5.1)
Sodium: 138 mmol/L (ref 135–145)
Total Bilirubin: 0.7 mg/dL (ref 0.3–1.2)
Total Protein: 7.8 g/dL (ref 6.5–8.1)

## 2019-12-23 LAB — CBC
HCT: 40.8 % (ref 36.0–46.0)
Hemoglobin: 13.9 g/dL (ref 12.0–15.0)
MCH: 26.6 pg (ref 26.0–34.0)
MCHC: 34.1 g/dL (ref 30.0–36.0)
MCV: 78 fL — ABNORMAL LOW (ref 80.0–100.0)
Platelets: 232 10*3/uL (ref 150–400)
RBC: 5.23 MIL/uL — ABNORMAL HIGH (ref 3.87–5.11)
RDW: 14.6 % (ref 11.5–15.5)
WBC: 4.8 10*3/uL (ref 4.0–10.5)
nRBC: 0 % (ref 0.0–0.2)

## 2019-12-23 LAB — WET PREP, GENITAL
Sperm: NONE SEEN
Trich, Wet Prep: NONE SEEN
Yeast Wet Prep HPF POC: NONE SEEN

## 2019-12-23 LAB — POCT PREGNANCY, URINE: Preg Test, Ur: NEGATIVE

## 2019-12-23 MED ORDER — METRONIDAZOLE 500 MG PO TABS: 500.0000 mg | ORAL_TABLET | Freq: Two times a day (BID) | ORAL | 0 refills | Status: DC
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
## 2019-12-24 ENCOUNTER — Encounter: Payer: Self-pay | Admitting: Family Medicine

## 2019-12-24 ENCOUNTER — Other Ambulatory Visit: Payer: Self-pay

## 2019-12-24 ENCOUNTER — Ambulatory Visit: Payer: Self-pay | Admitting: Family Medicine

## 2019-12-24 VITALS — BP 120/76 | HR 79 | Temp 97.3°F | Resp 16 | Ht 66.0 in | Wt 135.5 lb

## 2019-12-24 DIAGNOSIS — F0781 Postconcussional syndrome: Secondary | ICD-10-CM

## 2019-12-24 DIAGNOSIS — N760 Acute vaginitis: Secondary | ICD-10-CM

## 2019-12-24 DIAGNOSIS — B9689 Other specified bacterial agents as the cause of diseases classified elsewhere: Secondary | ICD-10-CM

## 2019-12-24 DIAGNOSIS — F430 Acute stress reaction: Secondary | ICD-10-CM

## 2019-12-24 MED ORDER — METRONIDAZOLE 0.75 % VA GEL: 1.0000 | Freq: Every day | VAGINAL | 0 refills | Status: AC
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
## 2019-12-28 DIAGNOSIS — R519 Headache, unspecified: Secondary | ICD-10-CM | POA: Insufficient documentation

## 2019-12-28 DIAGNOSIS — M542 Cervicalgia: Secondary | ICD-10-CM | POA: Insufficient documentation
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
## 2019-12-31 DIAGNOSIS — R4589 Other symptoms and signs involving emotional state: Secondary | ICD-10-CM | POA: Insufficient documentation

## 2019-12-31 DIAGNOSIS — F418 Other specified anxiety disorders: Secondary | ICD-10-CM | POA: Insufficient documentation

## 2020-01-05 DIAGNOSIS — G44309 Post-traumatic headache, unspecified, not intractable: Secondary | ICD-10-CM | POA: Insufficient documentation

## 2020-01-07 ENCOUNTER — Other Ambulatory Visit: Payer: Federal, State, Local not specified - PPO

## 2020-01-08 ENCOUNTER — Emergency Department
Admission: EM | Admit: 2020-01-08 | Discharge: 2020-01-08 | Disposition: A | Discharge status: Home / Self Care | Payer: Federal, State, Local not specified - PPO | Attending: Emergency Medicine | Admitting: Emergency Medicine

## 2020-01-08 ENCOUNTER — Other Ambulatory Visit: Payer: Self-pay

## 2020-01-08 DIAGNOSIS — Z91010 Allergy to peanuts: Secondary | ICD-10-CM | POA: Diagnosis not present

## 2020-01-08 DIAGNOSIS — R000 Tachycardia, unspecified: Secondary | ICD-10-CM | POA: Diagnosis present

## 2020-01-08 DIAGNOSIS — Z79899 Other long term (current) drug therapy: Secondary | ICD-10-CM | POA: Insufficient documentation

## 2020-01-08 DIAGNOSIS — R410 Disorientation, unspecified: Secondary | ICD-10-CM | POA: Diagnosis not present

## 2020-01-08 DIAGNOSIS — F410 Panic disorder [episodic paroxysmal anxiety] without agoraphobia: Secondary | ICD-10-CM

## 2020-01-08 LAB — COMPREHENSIVE METABOLIC PANEL
ALT: 11 U/L (ref 0–44)
AST: 14 U/L — ABNORMAL LOW (ref 15–41)
Albumin: 4.1 g/dL (ref 3.5–5.0)
Alkaline Phosphatase: 52 U/L (ref 38–126)
Anion gap: 6 (ref 5–15)
BUN: 15 mg/dL (ref 6–20)
CO2: 28 mmol/L (ref 22–32)
Calcium: 9.2 mg/dL (ref 8.9–10.3)
Chloride: 104 mmol/L (ref 98–111)
Creatinine, Ser: 0.85 mg/dL (ref 0.44–1.00)
GFR calc Af Amer: >60 mL/min (ref >60)
GFR calc non Af Amer: >60 mL/min (ref >60)
Glucose, Bld: 107 mg/dL — ABNORMAL HIGH (ref 70–99)
Potassium: 3.9 mmol/L (ref 3.5–5.1)
Sodium: 138 mmol/L (ref 135–145)
Total Bilirubin: 0.7 mg/dL (ref 0.3–1.2)
Total Protein: 7 g/dL (ref 6.5–8.1)

## 2020-01-08 LAB — CBC
HCT: 38.1 % (ref 36.0–46.0)
Hemoglobin: 12.8 g/dL (ref 12.0–15.0)
MCH: 26.5 pg (ref 26.0–34.0)
MCHC: 33.6 g/dL (ref 30.0–36.0)
MCV: 78.9 fL — ABNORMAL LOW (ref 80.0–100.0)
Platelets: 205 10*3/uL (ref 150–400)
RBC: 4.83 MIL/uL (ref 3.87–5.11)
RDW: 14.6 % (ref 11.5–15.5)
WBC: 5.9 10*3/uL (ref 4.0–10.5)
nRBC: 0 % (ref 0.0–0.2)

## 2020-01-08 LAB — TROPONIN I (HIGH SENSITIVITY): Troponin I (High Sensitivity): <2 ng/L (ref <18)

## 2020-01-08 MED ORDER — LORAZEPAM 0.5 MG PO TABS: 0.5000 mg | ORAL_TABLET | Freq: Once | ORAL | Status: DC

## 2020-01-08 NOTE — ED Notes (Signed)
Dr Brown and Butch, RN at bedside at this time. 

## 2020-01-08 NOTE — ED Triage Notes (Addendum)
Pt in with co palpitations states has been here for the same. Went to cardiologist yesterday and was told to take lexapro.Started lexapro today, and Is now here for persistent symptoms.

## 2020-01-08 NOTE — ED Provider Notes (Signed)
[facility] Emergency Department Provider Note  ____________________________________________   First MD Initiated Contact with Patient 01/08/20 0321     (approximate)  I have reviewed the triage vital signs and the nursing notes.   HISTORY  Chief Complaint Palpitations    HPI Jennifer Byrd is a 24 y.o. female with below list of previous medical conditions including anxiety disorder presents emergency department secondary to feelings of rapid heartbeat tonight after taking Lexapro for the first time.  Patient was seen by cardiology yesterday secondary to rapid heartbeats and prescribe Lexapro 5 mg tablets however the patient states that she believes she may have had a allergic reaction to the medication tonight as she states that after she took it she started feeling tightness in her throat".        Past Medical History:  Diagnosis Date  . Acid reflux   . Allergy   . Anemia   . Anxiety   . Heart palpitations   . Strep throat     Patient Active Problem List   Diagnosis Date Noted  . Post concussion syndrome 12/24/2019  . Vitamin D deficiency 10/04/2015  . Chronic sore throat 10/04/2015  . Hemorrhoidal skin tag 08/23/2015  . GERD without esophagitis 08/23/2015  . Chronic constipation 08/01/2015  . 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HCC) 07/22/2015  . Oral thrush 07/22/2015  . Allergic rhinitis with postnasal drip 07/22/2015  . History of Clostridium difficile colitis 07/18/2015  . Iron deficiency anemia 07/06/2015  . Palpitations 06/26/2015  . Acute urticaria 11/17/2014    Past Surgical History:  Procedure Laterality Date  . wisdon teeth      Prior to Admission medications   Medication Sig Start Date End Date Taking? Authorizing Provider  acetaminophen (TYLENOL) 500 MG tablet Take 1,000 mg by mouth every 8 (eight) hours as needed.    [provider]  loratadine (CLARITIN) 10 MG tablet Take 10 mg by mouth daily.    [provider]    Allergies Penicillins, Azithromycin, Cefdinir, Peanut-containing drug products, Latex, Amoxicillin, and Amoxicillin-pot clavulanate  Family History  Problem Relation Age of Onset  . Diabetes Mother   . Hypothyroidism Mother   . Diabetes Father   . Hypertension Father   . Hyperlipidemia Father   . Hypothyroidism Sister     Social History Social History   Tobacco Use  . Smoking status: Never Smoker  . Smokeless tobacco: Never Used  Substance Use Topics  . Alcohol use: No  . Drug use: No    Review of Systems Constitutional: No fever/chills Eyes: No visual changes. ENT: No sore throat. Cardiovascular: Denies chest pain. Respiratory: Denies shortness of breath. Gastrointestinal: No abdominal pain.  No nausea, no vomiting.  No diarrhea.  No constipation. Genitourinary: Negative for dysuria. Musculoskeletal: Negative for neck pain.  Negative for back pain. Integumentary: Negative for rash. Neurological: Negative for headaches, focal weakness or numbness. Psychiatric:  Positive for anxiety   ____________________________________________   PHYSICAL EXAM:  VITAL SIGNS: ED Triage Vitals  Enc Vitals Group     BP 01/08/20 0117 129/83     Pulse Rate 01/08/20 0117 91     Resp 01/08/20 0117 20     Temp 01/08/20 0117 98.2 F (36.8 C)     Temp Source 01/08/20 0117 Oral     SpO2 01/08/20 0335 100 %     Weight 01/08/20 0117 61.7 kg (136 lb)     Height 01/08/20 0117 1.676 m (5\' 6" )  Head Circumference --      Peak Flow --      Pain Score 01/08/20 0117 8     Pain Loc --      Pain Edu? --      Excl. in [place]? --     Constitutional: Alert and oriented.  Very anxious affect Eyes: Conjunctivae are normal.  Mouth/Throat: Patient is wearing a mask. Neck: No stridor.  No meningeal signs.   Cardiovascular: Normal rate, regular rhythm. Good peripheral circulation. Grossly normal heart sounds. Respiratory: Normal respiratory effort.  No  retractions. Gastrointestinal: Soft and nontender. No distention.  Musculoskeletal: No lower extremity tenderness nor edema. No gross deformities of extremities. Neurologic:  Normal speech and language. No gross focal neurologic deficits are appreciated.  Skin:  Skin is warm, dry and intact. Psychiatric: Very anxious affect.   ____________________________________________   LABS (all labs ordered are listed, but only abnormal results are displayed)  Labs Reviewed  CBC - Abnormal; Notable for the following components:      Result Value   MCV 78.9 (*)    All other components within normal limits  COMPREHENSIVE METABOLIC PANEL - Abnormal; Notable for the following components:   Glucose, Bld 107 (*)    AST 14 (*)    All other components within normal limits  TROPONIN I (HIGH SENSITIVITY)   ____________________________________________  EKG  ED ECG REPORT I, [place] N Hinda Lindor, the attending physician, personally viewed and interpreted this ECG.   Date: 01/08/2020  EKG Time: 1:29 AM  Rate: 81  Rhythm: Normal sinus rhythm  Axis: Normal  Intervals: Normal  ST&T Change: None     Procedures   ____________________________________________   INITIAL IMPRESSION / MDM / ASSESSMENT AND PLAN / ED COURSE  As part of my medical decision making, I reviewed the following data within the electronic MEDICAL RECORD NUMBER   24 year old female with above-stated history and physical exam consistent with a panic attack.  Patient's EKG revealed no evidence of arrhythmia.  Before going into the patient's room I noted her heart rate to be 74 after entering the room the patient's heart rate quickly went to 120 with worsening symptoms of anxiety hand twisting etc.  After speaking to the patient and calling her her heart rate returned back to the 70s.  I offered to give the patient a dose of Ativan however she stated that she thought she may have an allergic reaction to it and therefore she chose not to  receive it.  I strongly suggest to the patient that she follow-up with her primary care provider and discuss anxiety management.  Patient with no SI or HI    ____________________________________________  FINAL CLINICAL IMPRESSION(S) / ED DIAGNOSES  Final diagnoses:  Panic attack     MEDICATIONS GIVEN DURING THIS VISIT:  Medications  LORazepam (ATIVAN) tablet 0.5 mg (has no administration in time range)     ED Discharge Orders    None      *Please note:  Jennifer Byrd was evaluated in Emergency Department on 01/08/2020 for the symptoms described in the history of present illness. She was evaluated in the context of the global COVID-19 pandemic, which necessitated consideration that the patient might be at risk for infection with the SARS-CoV-2 virus that causes COVID-19. Institutional protocols and algorithms that pertain to the evaluation of patients at risk for COVID-19 are in a state of rapid change based on information released by regulatory bodies including the CDC and federal and state organizations. These  policies and algorithms were followed during the patient's care in the ED.  Some ED evaluations and interventions may be delayed as a result of limited staffing during the pandemic.*  Note:  This document was prepared using Dragon voice recognition software and may include unintentional dictation errors.   Darci Current, MD 01/08/20 [phone]

## 2020-01-08 NOTE — ED Notes (Signed)
Pt refused ativan. Wanted to take it at home. Pt believes that she might get an allergic rxn d/t long hx of allergic rxn to many medications. Educated that this would be the best environment to take the medication(especially if she were to get an allergic rxn). Therapeutic communication provided. Informed provider. Dr Manson Passey came to bedside to talk to pt but pt does not want to take medication.

## 2020-02-15 ENCOUNTER — Ambulatory Visit: Payer: Federal, State, Local not specified - PPO

## 2020-02-15 ENCOUNTER — Ambulatory Visit
Admission: EM | Admit: 2020-02-15 | Discharge: 2020-02-15 | Disposition: A | Discharge status: Home / Self Care | Payer: Federal, State, Local not specified - PPO | Attending: Emergency Medicine | Admitting: Emergency Medicine

## 2020-02-15 ENCOUNTER — Other Ambulatory Visit: Payer: Self-pay

## 2020-02-15 DIAGNOSIS — Z0189 Encounter for other specified special examinations: Secondary | ICD-10-CM | POA: Diagnosis not present

## 2020-02-15 DIAGNOSIS — R0989 Other specified symptoms and signs involving the circulatory and respiratory systems: Secondary | ICD-10-CM | POA: Diagnosis present

## 2020-02-15 DIAGNOSIS — F411 Generalized anxiety disorder: Secondary | ICD-10-CM | POA: Insufficient documentation

## 2020-02-15 LAB — POCT RAPID STREP A (OFFICE): Rapid Strep A Screen: NEGATIVE

## 2020-02-15 NOTE — ED Provider Notes (Signed)
Jennifer Byrd    CSN: [id number] Arrival date & time: 02/15/20  1105      History   Chief Complaint Chief Complaint  Patient presents with  . throat discomfort    HPI Jennifer Byrd is a 24 y.o. female.   Patient presents with sensation of a foreign body in her throat after drinking a strawberry smoothie this morning.  Patient states she feels short of breath because she is hyperventilating from anxiety.  She reports she is scheduled for an endoscopy on 01/29/2020 due to difficulty swallowing.  She denies fever, chills, rash, or other symptoms.  Patient requests a strep test and a COVID test.  Patient states she has an appointment with a psychiatrist today about her anxiety; she states she is not taking her Zoloft or hydroxyzine.    The history is provided by the patient.    Past Medical History:  Diagnosis Date  . Acid reflux   . Allergy   . Anemia   . Anxiety   . Heart palpitations   . Strep throat     Patient Active Problem List   Diagnosis Date Noted  . Post concussion syndrome 12/24/2019  . Vitamin D deficiency 10/04/2015  . Chronic sore throat 10/04/2015  . Hemorrhoidal skin tag 08/23/2015  . GERD without esophagitis 08/23/2015  . Chronic constipation 08/01/2015  . 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HCC) 07/22/2015  . Oral thrush 07/22/2015  . Allergic rhinitis with postnasal drip 07/22/2015  . History of Clostridium difficile colitis 07/18/2015  . Iron deficiency anemia 07/06/2015  . Palpitations 06/26/2015  . Acute urticaria 11/17/2014    Past Surgical History:  Procedure Laterality Date  . wisdon teeth      OB History   No obstetric history on file.      Home Medications    Prior to Admission medications   Medication Sig Start Date End Date Taking? Authorizing Provider  hydrOXYzine (ATARAX) 10 MG/5ML syrup Take 20 mg by mouth 4 (four) times daily as needed. 02/02/20  Yes [provider]  omeprazole (PRILOSEC) 20 MG capsule Take 20 mg by  mouth at bedtime. 02/03/20  Yes [provider]  sertraline (ZOLOFT) 20 MG/ML concentrated solution Take 20 mg by mouth daily. 02/10/20  Yes [provider]  acetaminophen (TYLENOL) 500 MG tablet Take 1,000 mg by mouth every 8 (eight) hours as needed.    [provider]  loratadine (CLARITIN) 10 MG tablet Take 10 mg by mouth daily.    [provider]    Family History Family History  Problem Relation Age of Onset  . Diabetes Mother   . Hypothyroidism Mother   . Diabetes Father   . Hypertension Father   . Hyperlipidemia Father   . Hypothyroidism Sister     Social History Social History   Tobacco Use  . Smoking status: Never Smoker  . Smokeless tobacco: Never Used  Substance Use Topics  . Alcohol use: No  . Drug use: No     Allergies   Penicillins, Azithromycin, Cefdinir, Peanut-containing drug products, Doxycycline, Latex, Meclizine, Amoxicillin, and Amoxicillin-pot clavulanate   Review of Systems Review of Systems  Constitutional: Negative for chills and fever.  HENT: Negative for ear pain, sore throat and trouble swallowing.        Sensation of strawberry stuck in her throat.  Eyes: Negative for pain and visual disturbance.  Respiratory: Positive for shortness of breath. Negative for cough.   Cardiovascular: Negative for chest pain and palpitations.  Gastrointestinal:  Negative for abdominal pain and vomiting.  Genitourinary: Negative for dysuria and hematuria.  Musculoskeletal: Negative for arthralgias and back pain.  Skin: Negative for color change and rash.  Neurological: Negative for seizures and syncope.  Psychiatric/Behavioral: The patient is nervous/anxious.   All other systems reviewed and are negative.    Physical Exam Triage Vital Signs ED Triage Vitals  Enc Vitals Group     BP      Pulse      Resp      Temp      Temp src      SpO2      Weight      Height      Head Circumference      Peak Flow      Pain Score       Pain Loc      Pain Edu?      Excl. in [place]?    No data found.  Updated Vital Signs Pulse 73   Temp 98.6 F (37 C) (Oral)   Resp 20   Ht 5\' 6"  (1.676 m)   Wt 130 lb (59 kg)   SpO2 99%   BMI 20.98 kg/m   Visual Acuity Right Eye Distance:   Left Eye Distance:   Bilateral Distance:    Right Eye Near:   Left Eye Near:    Bilateral Near:     Physical Exam Vitals and nursing note reviewed.  Constitutional:      General: She is not in acute distress.    Appearance: She is well-developed. She is not ill-appearing.     Comments: Well-appearing.  No difficulty talking or swallowing.    HENT:     Head: Normocephalic and atraumatic.     Right Ear: Tympanic membrane normal.     Left Ear: Tympanic membrane normal.     Nose: Nose normal.     Mouth/Throat:     Mouth: Mucous membranes are moist.     Pharynx: Oropharynx is clear.  Eyes:     Conjunctiva/sclera: Conjunctivae normal.  Cardiovascular:     Rate and Rhythm: Normal rate and regular rhythm.     Heart sounds: No murmur.  Pulmonary:     Effort: Pulmonary effort is normal. No respiratory distress.     Breath sounds: Normal breath sounds. No wheezing or rhonchi.     Comments: No respiratory distress. Abdominal:     Palpations: Abdomen is soft.     Tenderness: There is no abdominal tenderness. There is no guarding or rebound.  Musculoskeletal:     Cervical back: Neck supple.  Skin:    General: Skin is warm and dry.     Findings: No rash.  Neurological:     General: No focal deficit present.     Mental Status: She is alert and oriented to person, place, and time.     Sensory: No sensory deficit.     Motor: No weakness.     Gait: Gait normal.     Comments: Speech clear.  Psychiatric:     Comments: Patient is trembling and shaking.  "I am hyperventilating because I am anxious"  "I thought I was choking"      UC Treatments / Results  Labs (all labs ordered are listed, but only abnormal results are  displayed) Labs Reviewed  NOVEL CORONAVIRUS, NAA  CULTURE, GROUP A STREP [facility])  POCT RAPID STREP A (OFFICE)    EKG   Radiology No results found.  Procedures Procedures (including  critical care time)  Medications Ordered in UC Medications - No data to display  Initial Impression / Assessment and Plan / UC Course  I have reviewed the triage vital signs and the nursing notes.  Pertinent labs & imaging results that were available during my care of the patient were reviewed by me and considered in my medical decision making (see chart for details).    Anxiety.  Sensation of fb in throat.  Patient request for COVID test and strep test.  Rapid strep negative; throat culture pending.  Instructed patient to go to the emergency department if she has acute worsening symptoms or develops new symptoms, including difficulty swallowing or breathing.  Instructed her to follow-up with her psychiatrist as scheduled today to discuss her anxiety.  COVID test performed here.  Instructed patient to self quarantine until the test result is back.  Discussed with patient that she can take Tylenol as needed for fever or discomfort.  Instructed patient to go to the emergency department if she develops high fever, shortness of breath, severe diarrhea, or other concerning symptoms.  Patient agrees with plan of care.     Final Clinical Impressions(s) / UC Diagnoses   Final diagnoses:  Anxiety state  Patient request for diagnostic testing  Sensation of foreign body in throat     Discharge Instructions     Go to the emergency department if you have worsening symptoms or develop new symptoms, including difficulty swallowing or breathing.    Follow up with your primary care provider this week to discuss your anxiety.    Your rapid strep test is negative.  A throat culture is pending; we will call you if it is positive requiring treatment.    Your COVID test is pending.  You should self quarantine until  your test result is back and is negative.  Go to the emergency department if you develop high fever, shortness of breath, severe diarrhea, or other concerning symptoms.       ED Prescriptions    None     PDMP not reviewed this encounter.   Mickie Bail, NP 02/15/20 1158

## 2020-02-15 NOTE — Discharge Instructions (Signed)
Go to the emergency department if you have worsening symptoms or develop new symptoms, including difficulty swallowing or breathing.    Follow up with your primary care provider this week to discuss your anxiety.    Your rapid strep test is negative.  A throat culture is pending; we will call you if it is positive requiring treatment.    Your COVID test is pending.  You should self quarantine until your test result is back and is negative.  Go to the emergency department if you develop high fever, shortness of breath, severe diarrhea, or other concerning symptoms.

## 2020-02-15 NOTE — ED Triage Notes (Signed)
Pt presents with c/o feeling like there may be something stuck in her throat. She made a smoothie this morning with strawberries, she thinks they may not have been blended well. She states she was already having some difficulty swallowing prior to this episode. She is schedule for an endoscopy this Thursday. She is noticeably shaking and upset, she states she feels she is hyperventilating. She states she has not been eating well due to not being able to swallow. She has been eating mostly smoothies. She is very concerned about something being stuck in her throat.

## 2020-02-16 LAB — NOVEL CORONAVIRUS, NAA: SARS-CoV-2, NAA: NOT DETECTED

## 2020-02-17 LAB — CULTURE, GROUP A STREP (THRC)

## 2020-02-19 ENCOUNTER — Other Ambulatory Visit: Payer: Self-pay

## 2020-02-19 ENCOUNTER — Emergency Department: Payer: Federal, State, Local not specified - PPO

## 2020-02-19 ENCOUNTER — Emergency Department
Admission: EM | Admit: 2020-02-19 | Discharge: 2020-02-20 | Disposition: A | Discharge status: Home / Self Care | Payer: Federal, State, Local not specified - PPO | Attending: Emergency Medicine | Admitting: Emergency Medicine

## 2020-02-19 ENCOUNTER — Encounter: Payer: Self-pay | Admitting: Emergency Medicine

## 2020-02-19 DIAGNOSIS — F419 Anxiety disorder, unspecified: Secondary | ICD-10-CM

## 2020-02-19 DIAGNOSIS — Z91040 Latex allergy status: Secondary | ICD-10-CM | POA: Diagnosis not present

## 2020-02-19 DIAGNOSIS — R079 Chest pain, unspecified: Secondary | ICD-10-CM | POA: Diagnosis present

## 2020-02-19 DIAGNOSIS — Z91010 Allergy to peanuts: Secondary | ICD-10-CM | POA: Diagnosis not present

## 2020-02-19 IMAGING — CR DG CHEST 2V
1 series · 2 of 2 positions shown · non-contrast
Comparison: [DATE]

CLINICAL DATA: Endoscopy yesterday, chest pain today

EXAM:
CHEST - 2 VIEW

[Series 1: dg chest 2 view · 0.14mm/px · 2 of 2 slices shown]
[im 1/2]
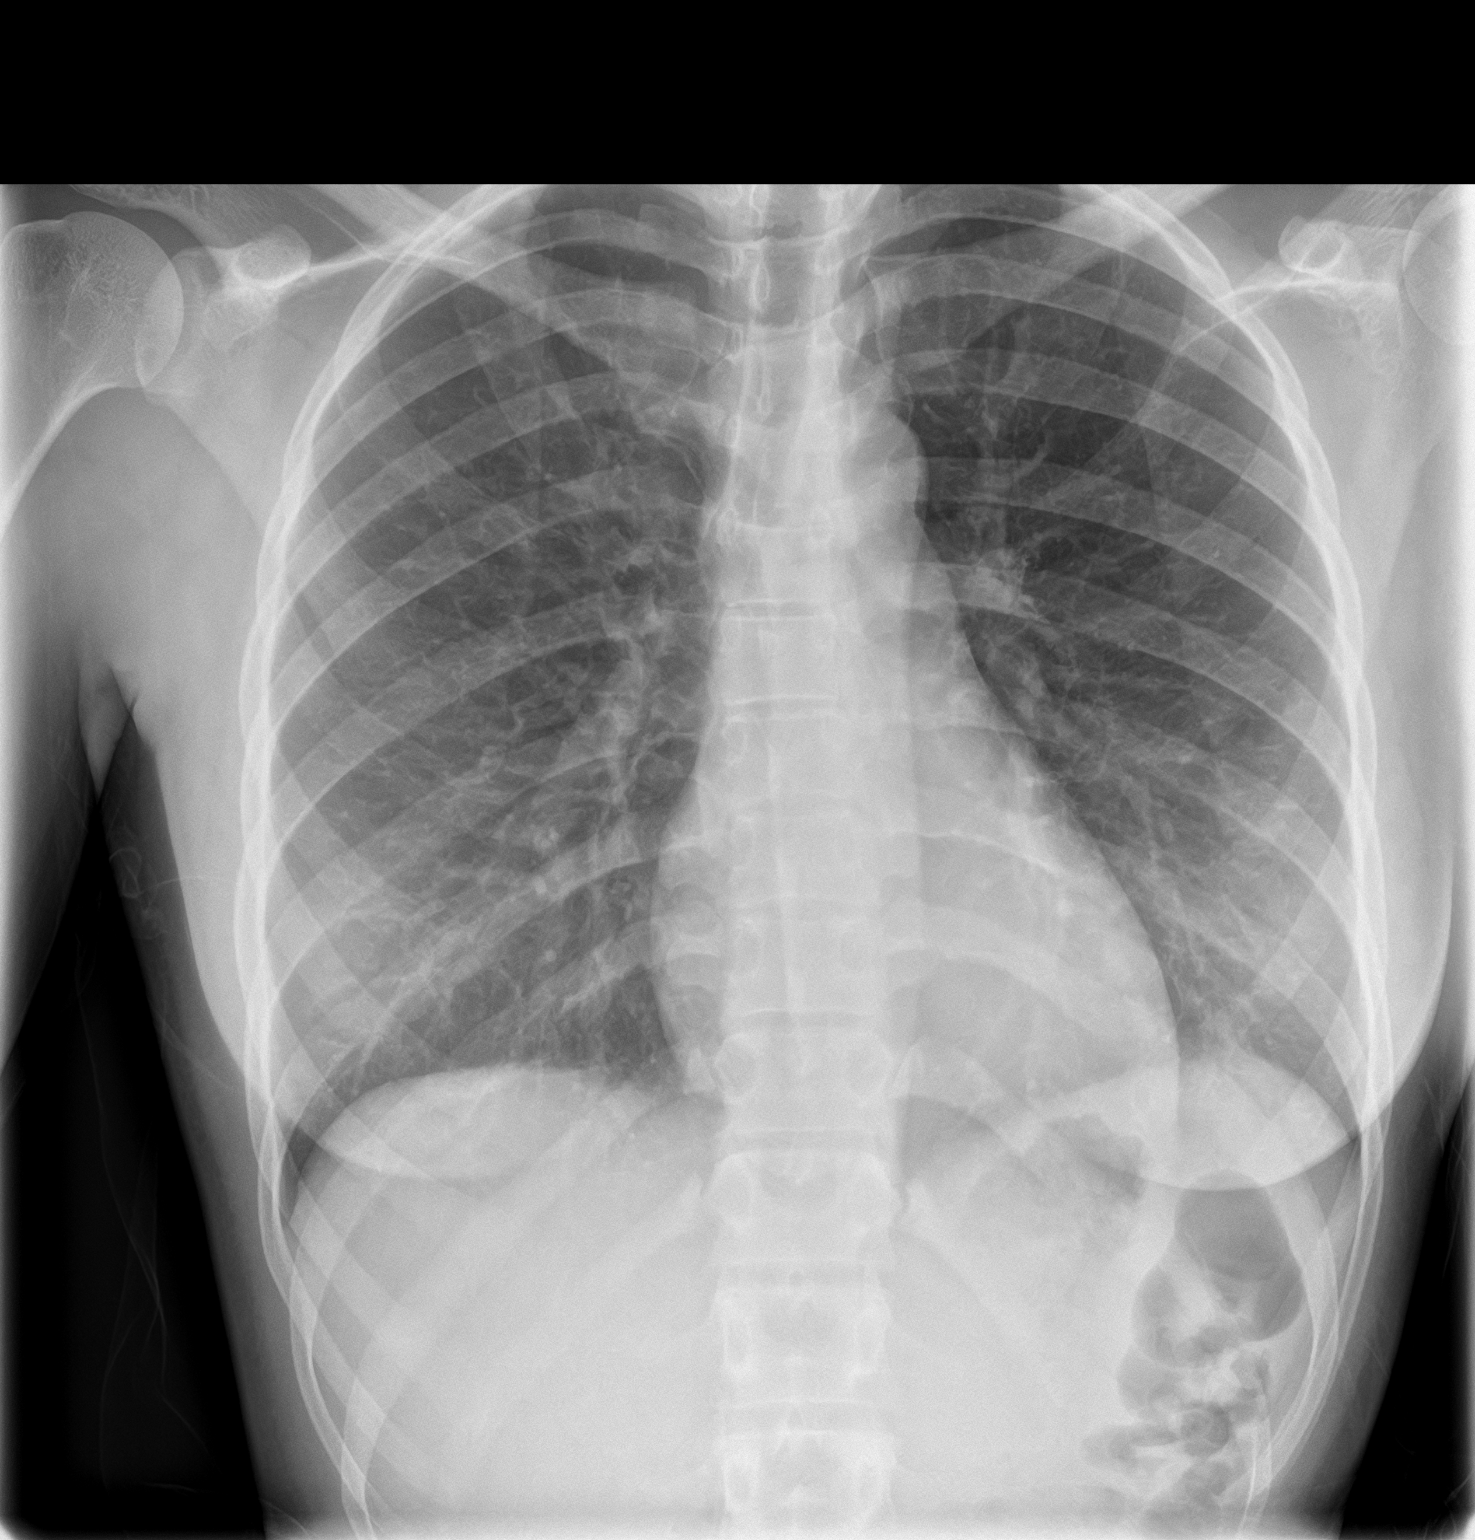
[im 2/2]
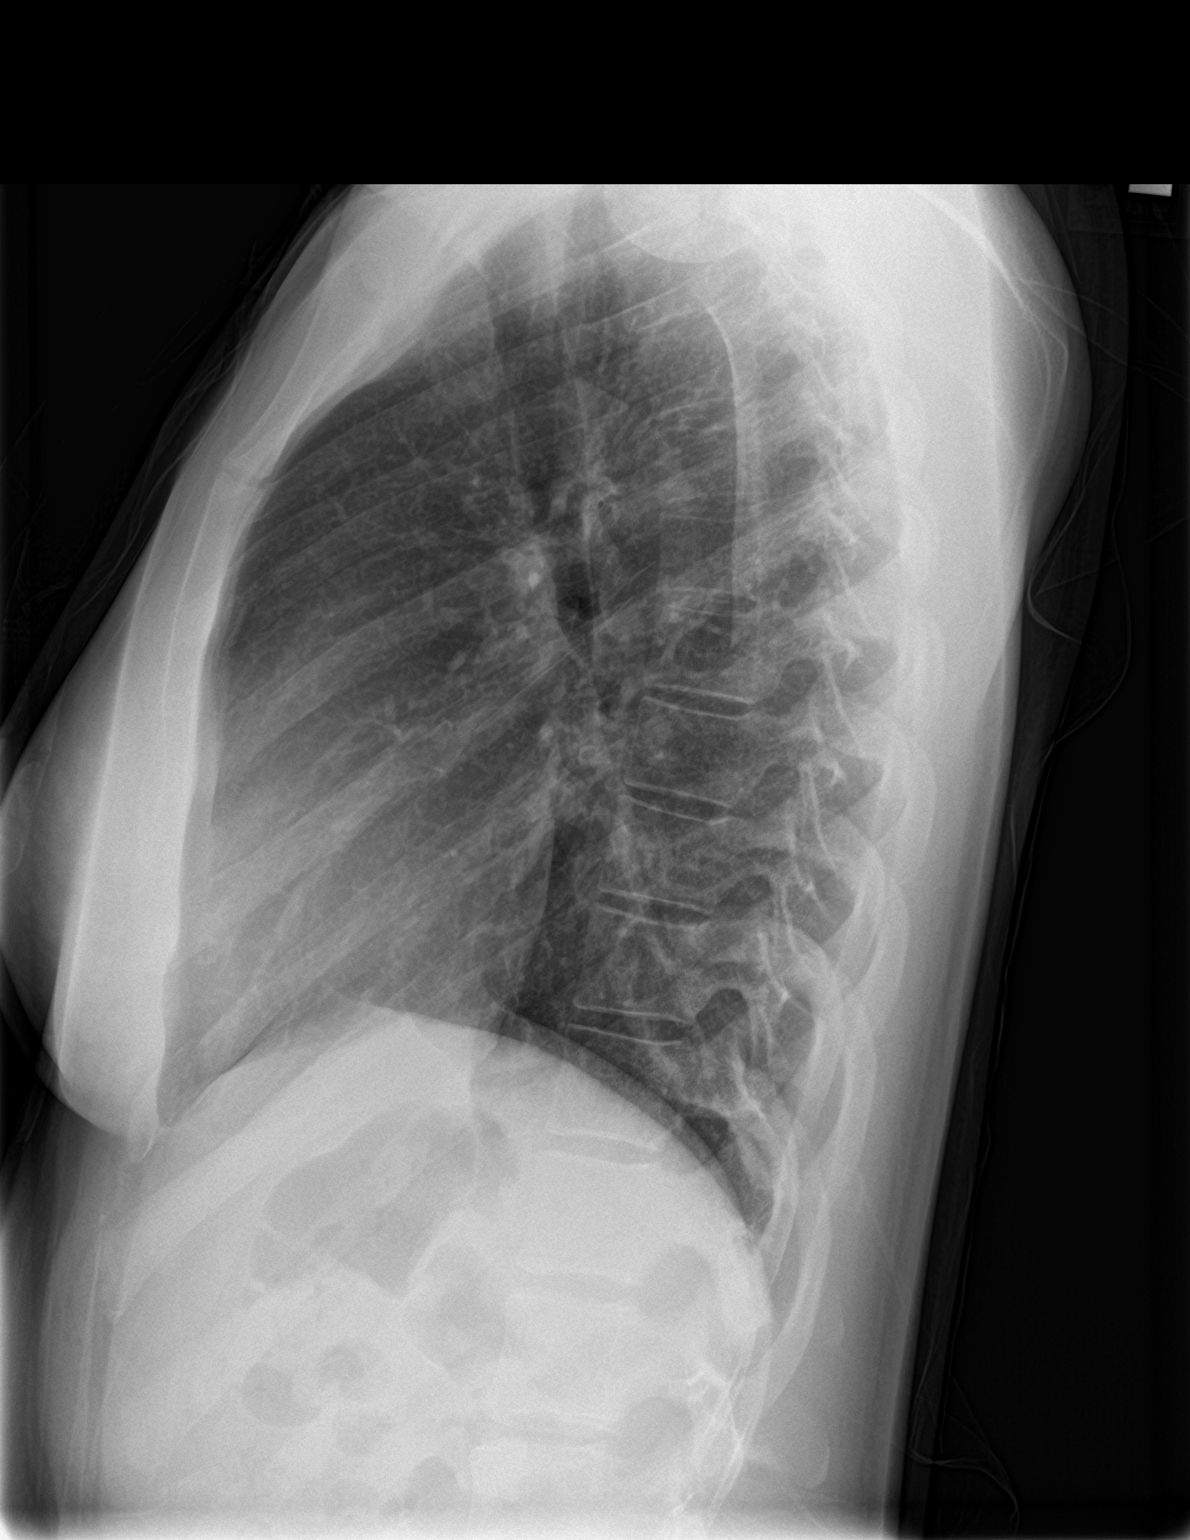

[2 of 2 positions shown; findings below may reference images not displayed]

FINDINGS: Frontal and lateral views of the chest demonstrate an unremarkable
cardiac silhouette. No airspace disease, effusion, or pneumothorax.
Visualized bowel gas pattern is unremarkable. No acute bony
abnormalities.
IMPRESSION: 1. No acute intrathoracic process.

## 2020-02-19 NOTE — ED Triage Notes (Signed)
Pt arrives POV to triage with c/o chest pain all day. Pt states that she had an endoscopy yesterday and has been feeling badly since. Pt is extremely nervous at this time in triage.

## 2020-02-20 LAB — CBC
HCT: 38.2 % (ref 36.0–46.0)
Hemoglobin: 13 g/dL (ref 12.0–15.0)
MCH: 26.7 pg (ref 26.0–34.0)
MCHC: 34 g/dL (ref 30.0–36.0)
MCV: 78.6 fL — ABNORMAL LOW (ref 80.0–100.0)
Platelets: 211 10*3/uL (ref 150–400)
RBC: 4.86 MIL/uL (ref 3.87–5.11)
RDW: 14.2 % (ref 11.5–15.5)
WBC: 5.8 10*3/uL (ref 4.0–10.5)
nRBC: 0 % (ref 0.0–0.2)

## 2020-02-20 LAB — BASIC METABOLIC PANEL
Anion gap: 8 (ref 5–15)
BUN: 8 mg/dL (ref 6–20)
CO2: 23 mmol/L (ref 22–32)
Calcium: 9.1 mg/dL (ref 8.9–10.3)
Chloride: 105 mmol/L (ref 98–111)
Creatinine, Ser: 0.76 mg/dL (ref 0.44–1.00)
GFR calc Af Amer: >60 mL/min (ref >60)
GFR calc non Af Amer: >60 mL/min (ref >60)
Glucose, Bld: 96 mg/dL (ref 70–99)
Potassium: 3.5 mmol/L (ref 3.5–5.1)
Sodium: 136 mmol/L (ref 135–145)

## 2020-02-20 LAB — POCT PREGNANCY, URINE: Preg Test, Ur: NEGATIVE

## 2020-02-20 LAB — TROPONIN I (HIGH SENSITIVITY): Troponin I (High Sensitivity): <2 ng/L (ref <18)

## 2020-02-20 MED ORDER — LORAZEPAM 0.5 MG PO TABS: 0.5000 mg | ORAL_TABLET | Freq: Every day | ORAL | 0 refills | Status: DC | PRN

## 2020-02-20 MED ORDER — LORAZEPAM 1 MG PO TABS: 1.0000 mg | ORAL_TABLET | Freq: Once | ORAL | Status: DC

## 2020-02-20 NOTE — ED Notes (Signed)
Reviewed discharge instructions, follow-up care, and prescriptions with patient. Patient verbalized understanding of all information reviewed. Patient stable, with no distress noted at this time.    

## 2020-02-20 NOTE — ED Provider Notes (Signed)
[facility] Emergency Department Provider Note       Time seen: ----------------------------------------- 12:01 AM on 02/20/2020 -----------------------------------------   I have reviewed the triage vital signs and the nursing notes.  HISTORY   Chief Complaint Chest Pain    HPI Jennifer Byrd is a 24 y.o. female with a history of GERD, anemia, anxiety, heart palpitations who presents to the ED for chest pain all day.  Patient states she had endoscopy yesterday and has been feeling poorly since then.  Patient states she had a normal endoscopy yesterday.  Discomfort is 9 out of 10.  Past Medical History:  Diagnosis Date  . Acid reflux   . Allergy   . Anemia   . Anxiety   . Heart palpitations   . Strep throat     Patient Active Problem List   Diagnosis Date Noted  . Post concussion syndrome 12/24/2019  . Vitamin D deficiency 10/04/2015  . Chronic sore throat 10/04/2015  . Hemorrhoidal skin tag 08/23/2015  . GERD without esophagitis 08/23/2015  . Chronic constipation 08/01/2015  . 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[place]) 07/22/2015  . Oral thrush 07/22/2015  . Allergic rhinitis with postnasal drip 07/22/2015  . History of Clostridium difficile colitis 07/18/2015  . Iron deficiency anemia 07/06/2015  . Palpitations 06/26/2015  . Acute urticaria 11/17/2014    Past Surgical History:  Procedure Laterality Date  . wisdon teeth      Allergies Penicillins, Azithromycin, Cefdinir, Peanut-containing drug products, Doxycycline, Latex, Meclizine, Amoxicillin, and Amoxicillin-pot clavulanate  Social History Social History   Tobacco Use  . Smoking status: Never Smoker  . Smokeless tobacco: Never Used  Substance Use Topics  . Alcohol use: No  . Drug use: No    Review of Systems Constitutional: Negative for fever. Cardiovascular: Positive for chest pain Respiratory: Negative for shortness of breath. Gastrointestinal: Negative for abdominal pain,  vomiting and diarrhea. Musculoskeletal: Negative for back pain. Skin: Negative for rash. Neurological: Negative for headaches, focal weakness or numbness.  All systems negative/normal/unremarkable except as stated in the HPI  ____________________________________________   PHYSICAL EXAM:  VITAL SIGNS: ED Triage Vitals  Enc Vitals Group     BP 02/19/20 2345 116/75     Pulse Rate 02/19/20 2345 75     Resp 02/19/20 2345 (!) 22     Temp 02/19/20 2345 (!) 97.5 F (36.4 C)     Temp Source 02/19/20 2345 Oral     SpO2 02/19/20 2345 100 %     Weight 02/19/20 2342 129 lb (58.5 kg)     Height 02/19/20 2342 5\' 6"  (1.676 m)     Head Circumference --      Peak Flow --      Pain Score 02/19/20 2341 9     Pain Loc --      Pain Edu? --      Excl. in [place]? --     Constitutional: Alert and oriented.  Anxious, no distress Eyes: Conjunctivae are normal. Normal extraocular movements. ENT      Head: Normocephalic and atraumatic.      Nose: No congestion/rhinnorhea.      Mouth/Throat: Mucous membranes are moist.      Neck: No stridor. Cardiovascular: Normal rate, regular rhythm. No murmurs, rubs, or gallops. Respiratory: Normal respiratory effort without tachypnea nor retractions. Breath sounds are clear and equal bilaterally. No wheezes/rales/rhonchi. Gastrointestinal: Soft and nontender. Normal bowel sounds Musculoskeletal: Nontender with normal range of motion in extremities. No lower extremity tenderness nor edema.  Neurologic:  Normal speech and language. No gross focal neurologic deficits are appreciated.  Skin:  Skin is warm, dry and intact. No rash noted. Psychiatric: Mood and affect are normal. Speech and behavior are normal.  ____________________________________________  EKG: Interpreted by me.  Sinus rhythm with rate of 89 bpm, normal PR interval, normal QRS, normal QT  ____________________________________________  ED COURSE:  As part of my medical decision making, I reviewed the  following data within the electronic MEDICAL RECORD NUMBER History obtained from family if available, nursing notes, old chart and ekg, as well as notes from prior ED visits. Patient presented for chest pain, we will assess with labs and imaging as indicated at this time.   Procedures  Zeah Joni Reining Gasaway was evaluated in Emergency Department on 02/20/2020 for the symptoms described in the history of present illness. She was evaluated in the context of the global COVID-19 pandemic, which necessitated consideration that the patient might be at risk for infection with the SARS-CoV-2 virus that causes COVID-19. Institutional protocols and algorithms that pertain to the evaluation of patients at risk for COVID-19 are in a state of rapid change based on information released by regulatory bodies including the CDC and federal and state organizations. These policies and algorithms were followed during the patient's care in the ED.  ____________________________________________   LABS (pertinent positives/negatives)  Labs Reviewed  CBC - Abnormal; Notable for the following components:      Result Value   MCV 78.6 (*)    All other components within normal limits  BASIC METABOLIC PANEL  POC URINE PREG, ED  POCT PREGNANCY, URINE  TROPONIN I (HIGH SENSITIVITY)  TROPONIN I (HIGH SENSITIVITY)    RADIOLOGY Images were viewed by me  Chest x-ray IMPRESSION:  1. No acute intrathoracic process.  ____________________________________________   DIFFERENTIAL DIAGNOSIS   Musculoskeletal pain, GERD, anxiety, PE, esophageal injury, pneumomediastinum  FINAL ASSESSMENT AND PLAN  Chest pain, dysphagia, anxiety   Plan: The patient had presented for chest pain today. Patient's labs reveal any acute process. Patient's imaging were reassuring.  I will write for some Ativan for her to take as needed instead of hydroxyzine.  Her anxiety is too severe and she is getting close to needing inpatient care should this not  improve.   Ulice Dash, MD    Note: This note was generated in part or whole with voice recognition software. Voice recognition is usually quite accurate but there are transcription errors that can and very often do occur. I apologize for any typographical errors that were not detected and corrected.     Emily Filbert, MD 02/20/20 0130

## 2020-03-25 ENCOUNTER — Ambulatory Visit
Admission: EM | Admit: 2020-03-25 | Discharge: 2020-03-25 | Disposition: A | Discharge status: Home / Self Care | Payer: Federal, State, Local not specified - PPO | Attending: Emergency Medicine | Admitting: Emergency Medicine

## 2020-03-25 ENCOUNTER — Encounter: Payer: Self-pay | Admitting: Emergency Medicine

## 2020-03-25 ENCOUNTER — Other Ambulatory Visit: Payer: Self-pay

## 2020-03-25 DIAGNOSIS — F419 Anxiety disorder, unspecified: Secondary | ICD-10-CM | POA: Insufficient documentation

## 2020-03-25 DIAGNOSIS — N898 Other specified noninflammatory disorders of vagina: Secondary | ICD-10-CM | POA: Diagnosis present

## 2020-03-25 DIAGNOSIS — Z881 Allergy status to other antibiotic agents status: Secondary | ICD-10-CM | POA: Insufficient documentation

## 2020-03-25 DIAGNOSIS — R519 Headache, unspecified: Secondary | ICD-10-CM | POA: Diagnosis not present

## 2020-03-25 DIAGNOSIS — Z79899 Other long term (current) drug therapy: Secondary | ICD-10-CM | POA: Diagnosis not present

## 2020-03-25 DIAGNOSIS — Z880 Allergy status to penicillin: Secondary | ICD-10-CM | POA: Diagnosis not present

## 2020-03-25 DIAGNOSIS — K219 Gastro-esophageal reflux disease without esophagitis: Secondary | ICD-10-CM | POA: Insufficient documentation

## 2020-03-25 DIAGNOSIS — K589 Irritable bowel syndrome without diarrhea: Secondary | ICD-10-CM | POA: Insufficient documentation

## 2020-03-25 DIAGNOSIS — R110 Nausea: Secondary | ICD-10-CM | POA: Diagnosis present

## 2020-03-25 DIAGNOSIS — R1011 Right upper quadrant pain: Secondary | ICD-10-CM

## 2020-03-25 MED ORDER — ONDANSETRON HCL 4 MG PO TABS: 4.0000 mg | ORAL_TABLET | Freq: Four times a day (QID) | ORAL | 0 refills | Status: DC | PRN

## 2020-03-25 NOTE — ED Triage Notes (Signed)
Pt c/o RLQ abdominal pain and nausea. Started yesterday and the nausea started today. Pt states that she was tested for covid yesterday and it was negative. Pt states she has known IBS and has been constipated. She had a BM this morning but was very little. Denies urinary symptoms. Pt states when she sits the pain gets better and standing up makes the pain worse.

## 2020-03-25 NOTE — ED Provider Notes (Signed)
Jennifer Byrd    CSN: [id number] Arrival date & time: 03/25/20  1423      History   Chief Complaint Chief Complaint  Patient presents with  . Abdominal Pain  . Nausea    HPI Jennifer Byrd is a 24 y.o. female.   Patient presents with right upper abdominal pain, nausea, and headache x 1 day.  She describes the abdominal pain as cramping, 5/10, intermittent, worse with movement.  She reports having a negative COVID test yesterday.  No vomiting or diarrhea.  Last BM this morning but she reports small amount; Patient states she has IBS but does not take medication for this.  She also reports white malodorous vaginal discharge x several weeks which she states is ongoing BV; she states she was last treated by her GYN 1 month ago with Flagyl.  She denies rash, lesions, dysuria, flank pain, pelvic pain, or other symptoms.    The history is provided by the patient.    Past Medical History:  Diagnosis Date  . Acid reflux   . Allergy   . Anemia   . Anxiety   . Heart palpitations   . Strep throat     Patient Active Problem List   Diagnosis Date Noted  . Post concussion syndrome 12/24/2019  . Vitamin D deficiency 10/04/2015  . Chronic sore throat 10/04/2015  . Hemorrhoidal skin tag 08/23/2015  . GERD without esophagitis 08/23/2015  . Chronic constipation 08/01/2015  . 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HCC) 07/22/2015  . Oral thrush 07/22/2015  . Allergic rhinitis with postnasal drip 07/22/2015  . History of Clostridium difficile colitis 07/18/2015  . Iron deficiency anemia 07/06/2015  . Palpitations 06/26/2015  . Acute urticaria 11/17/2014    Past Surgical History:  Procedure Laterality Date  . wisdon teeth      OB History   No obstetric history on file.      Home Medications    Prior to Admission medications   Medication Sig Start Date End Date Taking? Authorizing Provider  loratadine (CLARITIN) 10 MG tablet Take 10 mg by mouth daily.   Yes [provider]  omeprazole (PRILOSEC) 20 MG capsule Take 20 mg by mouth at bedtime. 02/03/20  Yes [provider]  acetaminophen (TYLENOL) 500 MG tablet Take 1,000 mg by mouth every 8 (eight) hours as needed.    [provider]  hydrOXYzine (ATARAX) 10 MG/5ML syrup Take 20 mg by mouth 4 (four) times daily as needed. 02/02/20   [provider]  LORazepam (ATIVAN) 0.5 MG tablet Take 1 tablet (0.5 mg total) by mouth daily as needed for anxiety. 02/20/20 02/19/21  Emily Filbert, MD  ondansetron (ZOFRAN) 4 MG tablet Take 1 tablet (4 mg total) by mouth every 6 (six) hours as needed for nausea or vomiting. 03/25/20   Mickie Bail, NP  sertraline (ZOLOFT) 20 MG/ML concentrated solution Take 20 mg by mouth daily. 02/10/20   [provider]    Family History Family History  Problem Relation Age of Onset  . Diabetes Mother   . Hypothyroidism Mother   . Diabetes Father   . Hypertension Father   . Hyperlipidemia Father   . Hypothyroidism Sister     Social History Social History   Tobacco Use  . Smoking status: Never Smoker  . Smokeless tobacco: Never Used  Substance Use Topics  . Alcohol use: No  . Drug use: No     Allergies   Penicillins, Azithromycin, Cefdinir, Peanut-containing drug products,  Doxycycline, Latex, Meclizine, Amoxicillin, and Amoxicillin-pot clavulanate   Review of Systems Review of Systems  Constitutional: Negative for chills and fever.  HENT: Negative for ear pain and sore throat.   Eyes: Negative for pain and visual disturbance.  Respiratory: Negative for cough and shortness of breath.   Cardiovascular: Negative for chest pain and palpitations.  Gastrointestinal: Positive for abdominal pain and nausea. Negative for diarrhea and vomiting.  Genitourinary: Positive for vaginal discharge. Negative for dysuria, flank pain, hematuria and pelvic pain.  Musculoskeletal: Negative for arthralgias and back pain.  Skin: Negative for color  change and rash.  Neurological: Negative for seizures and syncope.  All other systems reviewed and are negative.    Physical Exam Triage Vital Signs ED Triage Vitals  Enc Vitals Group     BP 03/25/20 1428 121/76     Pulse Rate 03/25/20 1428 82     Resp 03/25/20 1428 18     Temp 03/25/20 1428 97.8 F (36.6 C)     Temp Source 03/25/20 1428 Oral     SpO2 03/25/20 1428 97 %     Weight 03/25/20 1429 131 lb (59.4 kg)     Height 03/25/20 1429 5\' 6"  (1.676 m)     Head Circumference --      Peak Flow --      Pain Score 03/25/20 1429 5     Pain Loc --      Pain Edu? --      Excl. in [place]? --    No data found.  Updated Vital Signs BP 121/76 (BP Location: Left Arm)   Pulse 82   Temp 97.8 F (36.6 C) (Oral)   Resp 18   Ht 5\' 6"  (1.676 m)   Wt 131 lb (59.4 kg)   SpO2 97%   BMI 21.14 kg/m   Visual Acuity Right Eye Distance:   Left Eye Distance:   Bilateral Distance:    Right Eye Near:   Left Eye Near:    Bilateral Near:     Physical Exam Vitals and nursing note reviewed.  Constitutional:      General: She is not in acute distress.    Appearance: She is well-developed. She is not ill-appearing.  HENT:     Head: Normocephalic and atraumatic.     Mouth/Throat:     Mouth: Mucous membranes are moist.     Pharynx: Oropharynx is clear.  Eyes:     Conjunctiva/sclera: Conjunctivae normal.  Cardiovascular:     Rate and Rhythm: Normal rate and regular rhythm.     Heart sounds: No murmur.  Pulmonary:     Effort: Pulmonary effort is normal. No respiratory distress.     Breath sounds: Normal breath sounds.  Abdominal:     General: Bowel sounds are normal. There is no distension.     Palpations: Abdomen is soft.     Tenderness: There is no abdominal tenderness. There is no right CVA tenderness, left CVA tenderness, guarding or rebound.  Musculoskeletal:     Cervical back: Neck supple.  Skin:    General: Skin is warm and dry.     Findings: No rash.  Neurological:      General: No focal deficit present.     Mental Status: She is alert and oriented to person, place, and time.     Gait: Gait normal.      UC Treatments / Results  Labs (all labs ordered are listed, but only abnormal results are displayed) Labs Reviewed  CERVICOVAGINAL ANCILLARY ONLY    EKG   Radiology No results found.  Procedures Procedures (including critical care time)  Medications Ordered in UC Medications - No data to display  Initial Impression / Assessment and Plan / UC Course  I have reviewed the triage vital signs and the nursing notes.  Pertinent labs & imaging results that were available during my care of the patient were reviewed by me and considered in my medical decision making (see chart for details).   RUQ abdominal pain, Nausea, Vaginal discharge.  Patient is well-appearing and her exam is unremarkable.  Treating with Zofran.  Discussed OTC Miralax for constipation.  Vaginal self swab obtained by patient.  Instructed her to refrain from sex until test results are back.  Instructed patient to go to the ED if she has increased abdominal pain, fever, vomiting, diarrhea, or other concerning symptoms.  Patient agrees to plan of care.   Final Clinical Impressions(s) / UC Diagnoses   Final diagnoses:  Right upper quadrant abdominal pain  Nausea without vomiting  Vaginal discharge     Discharge Instructions     Take the antinausea medication as directed.  Take over-the-counter Miralax as needed for constipation.    Your vaginal swab tests are pending.  Do not have sex until your test results are back.  We will call you if your tests show the need for treatment.    Go to the emergency department if you have increased abdominal pain or other concerning symptoms.               ED Prescriptions    Medication Sig Dispense Auth. Provider   ondansetron (ZOFRAN) 4 MG tablet Take 1 tablet (4 mg total) by mouth every 6 (six) hours as needed for nausea or  vomiting. 12 tablet Mickie Bail, NP     PDMP not reviewed this encounter.   Mickie Bail, NP 03/25/20 1520

## 2020-03-25 NOTE — Discharge Instructions (Addendum)
Take the antinausea medication as directed.  Take over-the-counter Miralax as needed for constipation.    Your vaginal swab tests are pending.  Do not have sex until your test results are back.  We will call you if your tests show the need for treatment.    Go to the emergency department if you have increased abdominal pain or other concerning symptoms.

## 2020-03-28 LAB — CERVICOVAGINAL ANCILLARY ONLY
Bacterial Vaginitis (gardnerella): NEGATIVE
Candida Glabrata: NEGATIVE
Candida Vaginitis: NEGATIVE
Chlamydia: NEGATIVE
Comment: NEGATIVE
Comment: NEGATIVE
Comment: NEGATIVE
Comment: NEGATIVE
Comment: NEGATIVE
Comment: NORMAL
Neisseria Gonorrhea: NEGATIVE
Trichomonas: NEGATIVE

## 2020-03-29 ENCOUNTER — Emergency Department
Admission: EM | Admit: 2020-03-29 | Discharge: 2020-03-29 | Disposition: A | Discharge status: Home / Self Care | Payer: Federal, State, Local not specified - PPO | Attending: Student in an Organized Health Care Education/Training Program | Admitting: Student in an Organized Health Care Education/Training Program

## 2020-03-29 ENCOUNTER — Encounter: Payer: Self-pay | Admitting: Emergency Medicine

## 2020-03-29 DIAGNOSIS — R0989 Other specified symptoms and signs involving the circulatory and respiratory systems: Secondary | ICD-10-CM | POA: Insufficient documentation

## 2020-03-29 DIAGNOSIS — Z91040 Latex allergy status: Secondary | ICD-10-CM | POA: Insufficient documentation

## 2020-03-29 DIAGNOSIS — R1310 Dysphagia, unspecified: Secondary | ICD-10-CM | POA: Diagnosis present

## 2020-03-29 DIAGNOSIS — F418 Other specified anxiety disorders: Secondary | ICD-10-CM | POA: Diagnosis not present

## 2020-03-29 DIAGNOSIS — Z79899 Other long term (current) drug therapy: Secondary | ICD-10-CM | POA: Insufficient documentation

## 2020-03-29 NOTE — ED Notes (Signed)
See triage note, pt reports difficulty swallowing for the past 2 months. States she is having difficulty eating and is losing weight, difficulty swallowing saliva.  Pt speaking with no difficulty.  Reports had endoscopy within the last 2 months with no abnormal findings, prescribed omeprazole.

## 2020-03-29 NOTE — ED Provider Notes (Signed)
[facility] Emergency Department Provider Note  ____________________________________________  Time seen: Approximately 10:15 PM  I have reviewed the triage vital signs and the nursing notes.   HISTORY  Chief Complaint Dysphagia    HPI Jennifer Byrd is a 24 y.o. female who presents the emergency department  complaining of dysphagia.  Patient had a choking episode 2 months ago.  She states that she presented to the emergency department, but was able to swallow, she has since seen ENT, GI and has had both a scope with ENT as well as endoscopy.  Patient states that she has had no retained foreign body, they did see some esophagitis from GERD but no achalasia.  Patient states that she has had difficulty progressing with her eating.  She states that she will eat soft foods for a while, try to reintroduce solids and will have a choking sensation.  She had an episode tonight and presented to the emergency department.  She is still able to swallow liquids, control oral secretions.  Patient has a long history of anxiety and does have a history of GERD as well.        Past Medical History:  Diagnosis Date  . Acid reflux   . Allergy   . Anemia   . Anxiety   . Heart palpitations   . Strep throat     Patient Active Problem List   Diagnosis Date Noted  . Post concussion syndrome 12/24/2019  . Vitamin D deficiency 10/04/2015  . Chronic sore throat 10/04/2015  . Hemorrhoidal skin tag 08/23/2015  . GERD without esophagitis 08/23/2015  . Chronic constipation 08/01/2015  . 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HCC) 07/22/2015  . Oral thrush 07/22/2015  . Allergic rhinitis with postnasal drip 07/22/2015  . History of Clostridium difficile colitis 07/18/2015  . Iron deficiency anemia 07/06/2015  . Palpitations 06/26/2015  . Acute urticaria 11/17/2014    Past Surgical History:  Procedure Laterality Date  . wisdon teeth      Prior to Admission medications   Medication  Sig Start Date End Date Taking? Authorizing Provider  acetaminophen (TYLENOL) 500 MG tablet Take 1,000 mg by mouth every 8 (eight) hours as needed.    [provider]  hydrOXYzine (ATARAX) 10 MG/5ML syrup Take 20 mg by mouth 4 (four) times daily as needed. 02/02/20   [provider]  loratadine (CLARITIN) 10 MG tablet Take 10 mg by mouth daily.    [provider]  LORazepam (ATIVAN) 0.5 MG tablet Take 1 tablet (0.5 mg total) by mouth daily as needed for anxiety. 02/20/20 02/19/21  Emily Filbert, MD  omeprazole (PRILOSEC) 20 MG capsule Take 20 mg by mouth at bedtime. 02/03/20   [provider]  ondansetron (ZOFRAN) 4 MG tablet Take 1 tablet (4 mg total) by mouth every 6 (six) hours as needed for nausea or vomiting. 03/25/20   Mickie Bail, NP  sertraline (ZOLOFT) 20 MG/ML concentrated solution Take 20 mg by mouth daily. 02/10/20   [provider]    Allergies Penicillins, Azithromycin, Cefdinir, Peanut-containing drug products, Doxycycline, Latex, Meclizine, Amoxicillin, and Amoxicillin-pot clavulanate  Family History  Problem Relation Age of Onset  . Diabetes Mother   . Hypothyroidism Mother   . Diabetes Father   . Hypertension Father   . Hyperlipidemia Father   . Hypothyroidism Sister     Social History Social History   Tobacco Use  . Smoking status: Never Smoker  . Smokeless tobacco: Never Used  Substance Use Topics  .  Alcohol use: No  . Drug use: No     Review of Systems  Constitutional: No fever/chills Eyes: No visual changes. No discharge ENT: Positive for dysphagia, however controlling oral secretions.  Able to drink. Cardiovascular: no chest pain. Respiratory: no cough. No SOB. Gastrointestinal: No abdominal pain.  No nausea, no vomiting.  No diarrhea.  No constipation. Musculoskeletal: Negative for musculoskeletal pain. Skin: Negative for rash, abrasions, lacerations, ecchymosis. Neurological: Negative for headaches,  focal weakness or numbness. 10-point ROS otherwise negative.  ____________________________________________   PHYSICAL EXAM:  VITAL SIGNS: ED Triage Vitals [03/29/20 2205]  Enc Vitals Group     BP 132/83     Pulse Rate 65     Resp 18     Temp 99 F (37.2 C)     Temp Source Oral     SpO2 100 %     Weight      Height      Head Circumference      Peak Flow      Pain Score      Pain Loc      Pain Edu?      Excl. in [place]?      Constitutional: Alert and oriented. Well appearing and in no acute distress. Eyes: Conjunctivae are normal. PERRL. EOMI. Head: Atraumatic. ENT:      Ears:       Nose: No congestion/rhinnorhea.      Mouth/Throat: Mucous membranes are moist.  No oropharyngeal erythema or edema.  No retained foreign body.  No evidence of oropharyngeal trauma. Neck: No stridor.    Cardiovascular: Normal rate, regular rhythm. Normal S1 and S2.  Good peripheral circulation. Respiratory: Normal respiratory effort without tachypnea or retractions. Lungs CTAB. Good air entry to the bases with no decreased or absent breath sounds. Gastrointestinal: Bowel sounds 4 quadrants. Soft and nontender to palpation. No guarding or rigidity. No palpable masses. No distention. No CVA tenderness. Musculoskeletal: Full range of motion to all extremities. No gross deformities appreciated. Neurologic:  Normal speech and language. No gross focal neurologic deficits are appreciated.  Skin:  Skin is warm, dry and intact. No rash noted. Psychiatric: Mood and affect are normal. Speech and behavior are normal. Patient exhibits appropriate insight and judgement.   ____________________________________________   LABS (all labs ordered are listed, but only abnormal results are displayed)  Labs Reviewed - No data to display ____________________________________________  EKG   ____________________________________________  RADIOLOGY   No results  found.  ____________________________________________    PROCEDURES  Procedure(s) performed:    Procedures    Medications - No data to display   ____________________________________________   INITIAL IMPRESSION / ASSESSMENT AND PLAN / ED COURSE  Pertinent labs & imaging results that were available during my care of the patient were reviewed by me and considered in my medical decision making (see chart for details).  Review of the [place] CSRS was performed in accordance of the [place] prior to dispensing any controlled drugs.           Patient's diagnosis is consistent with dysphagia, anxiety about health.  Patient presented to the emergency department complaining of difficulty swallowing.  Patient had a choking episode 2 months ago, since then has had multiple repeated episodes where she felt like she was choking.  Patient has had 2 scopes with negative results.  Patient does have a little esophagitis from GERD and is on omeprazole for same.  After a lengthy discussion with the patient, I do feel that symptoms are  most likely provoked by anxiety.  Patient does have a history of anxiety and sees psychiatry for same.  She is scheduled to see psychiatry in 4 days.  Patient feels much reassured after lengthy discussion, we talked about multiple management techniques to control anxiety as well as to help progress her eating.  Patient does not require any medications at this time.  She will follow up with psychiatry, return to GI as needed.. Patient is given ED precautions to return to the ED for any worsening or new symptoms.     ____________________________________________  FINAL CLINICAL IMPRESSION(S) / ED DIAGNOSES  Final diagnoses:  Dysphagia, unspecified type  Anxiety about health      NEW MEDICATIONS STARTED DURING THIS VISIT:  ED Discharge Orders    None          This chart was dictated using voice recognition software/Dragon. Despite best efforts to proofread,  errors can occur which can change the meaning. Any change was purely unintentional.    Racheal Patches, PA-C 03/29/20 2329    Willy Eddy, MD 03/29/20 2351

## 2020-03-29 NOTE — ED Notes (Signed)
Signature pad failed.  Discharge instructions reviewed with pt; pt verbalized understanding with no further questions.

## 2020-03-29 NOTE — ED Triage Notes (Signed)
Pt reports she is having difficulty swallowing x1 month and has been seen and had upper scope on 4/25 with no findings. Pt to ED tonight due to same issue. Pt able to communicate without difficulty and able to pass saliva by swallowing without difficulty.

## 2020-04-04 ENCOUNTER — Ambulatory Visit: Payer: Federal, State, Local not specified - PPO | Admitting: Speech Pathology

## 2020-04-04 ENCOUNTER — Other Ambulatory Visit: Payer: Self-pay

## 2020-04-04 ENCOUNTER — Ambulatory Visit: Payer: Federal, State, Local not specified - PPO | Attending: Family Medicine

## 2020-04-04 DIAGNOSIS — R2681 Unsteadiness on feet: Secondary | ICD-10-CM | POA: Diagnosis present

## 2020-04-04 DIAGNOSIS — G44321 Chronic post-traumatic headache, intractable: Secondary | ICD-10-CM

## 2020-04-04 DIAGNOSIS — R41841 Cognitive communication deficit: Secondary | ICD-10-CM | POA: Diagnosis present

## 2020-04-04 DIAGNOSIS — R498 Other voice and resonance disorders: Secondary | ICD-10-CM

## 2020-04-04 DIAGNOSIS — M542 Cervicalgia: Secondary | ICD-10-CM

## 2020-04-04 DIAGNOSIS — R42 Dizziness and giddiness: Secondary | ICD-10-CM | POA: Diagnosis present
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
## 2020-04-08 ENCOUNTER — Ambulatory Visit: Payer: Federal, State, Local not specified - PPO

## 2020-04-11 ENCOUNTER — Ambulatory Visit: Payer: Federal, State, Local not specified - PPO | Admitting: Speech Pathology

## 2020-04-11 ENCOUNTER — Ambulatory Visit: Payer: Federal, State, Local not specified - PPO | Admitting: Family Medicine

## 2020-04-11 ENCOUNTER — Other Ambulatory Visit: Payer: Self-pay

## 2020-04-11 ENCOUNTER — Ambulatory Visit: Payer: Federal, State, Local not specified - PPO

## 2020-04-11 ENCOUNTER — Encounter: Payer: Self-pay | Admitting: Speech Pathology

## 2020-04-11 DIAGNOSIS — R2681 Unsteadiness on feet: Secondary | ICD-10-CM

## 2020-04-11 DIAGNOSIS — R41841 Cognitive communication deficit: Secondary | ICD-10-CM

## 2020-04-11 DIAGNOSIS — M542 Cervicalgia: Secondary | ICD-10-CM

## 2020-04-11 DIAGNOSIS — R42 Dizziness and giddiness: Secondary | ICD-10-CM | POA: Diagnosis not present
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
## 2020-04-11 NOTE — Therapy (Signed)
[facility] [address] [place], [place], [address] Phone: [phone]   Fax:  [phone]  Speech Language Pathology Treatment  Patient Details  Name: Jennifer Byrd MRN: [id number] Date of Birth: 1996/03/15 Referring Provider (SLP): Dr. Delene Ruffini   Encounter Date: 04/11/2020  End of Session - 04/11/20 1313    Visit Number  2    Number of Visits  13    Date for SLP Re-Evaluation  05/18/20    SLP Start Time  1232    SLP Stop Time   1312    SLP Time Calculation (min)  40 min    Activity Tolerance  Patient tolerated treatment well       Past Medical History:  Diagnosis Date  . Acid reflux   . Allergy   . Anemia   . Anxiety   . Heart palpitations   . Strep throat     Past Surgical History:  Procedure Laterality Date  . wisdon teeth      There were no vitals filed for this visit.  Subjective Assessment - 04/11/20 1238    Subjective  "my allergist says it's post nasal drip, the ENT says is reflux, the pysc says it's anxiety"  re: globus sensation    Currently in Pain?  No/denies            ADULT SLP TREATMENT - 04/11/20 1249      General Information   Behavior/Cognition  Alert;Cooperative;Pleasant mood      Treatment Provided   Treatment provided  Cognitive-Linquistic      Cognitive-Linquistic Treatment   Treatment focused on  Cognition    Skilled Treatment  Initited training in compensations for attention and memory for complex information such as insurance and job search including note taking, keeping up with names of people she talks to.  Educated pt that anxiety and depression also affect her cognition including attention, processing and memory.       Assessment / Recommendations / Plan   Plan  Continue with current plan of care      Progression Toward Goals   Progression toward goals  Progressing toward goals       SLP Education - 04/11/20 1311    Education Details   compensations for cognition    Person(s) Educated  Patient    Methods  Explanation;Verbal cues;Handout    Comprehension  Verbalized understanding;Need further instruction;Verbal cues required       SLP Short Term Goals - 04/11/20 1313      SLP SHORT TERM GOAL #1   Title  Pt will uitlize journal to recall events, conversations and pertinent infomation with occasional min A over 2 sessions    Time  3    Period  Weeks    Status  On-going      SLP SHORT TERM GOAL #2   Title  Pt will utilize compenstions for attention and processing during note taking taks with occasional min A    Time  3    Period  Weeks    Status  On-going      SLP SHORT TERM GOAL #3   Title  Pt will implement 2 reflux precautions for LPR with rare min A    Time  3    Period  Weeks    Status  On-going       SLP Long Term Goals - 04/11/20 1313      SLP LONG TERM GOAL #1   Title  Pt  will utilize compensations for memory and attention for financial management and management of medical/insurance information with rare min A over 2 sessions    Time  6    Period  Weeks    Status  On-going      SLP LONG TERM GOAL #2   Title  Pt will generate 4 accomondations she can carryover in the classroom to compensate for memory, attention and processing with mod I    Time  6    Period  Weeks    Status  On-going      SLP LONG TERM GOAL #3   Title  Pt will carryover 3 vocal hygiene strastegies over 2 sessions with mod I    Time  6    Period  Weeks    Status  On-going       Plan - 04/11/20 1312    Clinical Impression Statement  Mattison Hartig is referred for outpt ST due to cognitive liguistic impairments s/p mTBI. Vika reports memory and attention as primary difficulties. She reports she met her brother in [place] at a party, but didn't remember meeting him later. She is forgetting conversations and loosing her train of thought at times. The Cognitive Linguistic Quick Test (CLQT) was administered and revealed low WNL  results. Barbi missed and crossed out incorrect symbol on symbol cancellation without awareness despite cue to double check. She recalled 12/18 story elements with immediate retell and did not recall simple story detail on yes/no questions. On design memory, she didn't recall 1/6 designs with no delay. 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 Makari is reporting globus sensation and difficulty swallowing. Per her report, ENT Dr. 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 Shanina's voice is raspy and she affirms this. She has GI consult pending for barium swallow per her report. May consider objective swallow study if warranted. She has started PPI. I recommend skilled ST to maximize cognitive communication for return to school and education re: vocal hygiene to maximxize voice and intlligilbity.    Speech Therapy Frequency  2x / week    Treatment/Interventions  Aspiration precaution training;Cueing hierarchy;SLP instruction and feedback;Compensatory strategies;Functional tasks;Pharyngeal strengthening exercises;Diet toleration management by SLP;Trials of upgraded texture/liquids;Internal/external aids;Multimodal communcation approach;Patient/family education;Cognitive reorganization;Compensatory techniques;Other (comment)    Potential to Achieve Goals  Good    Potential Considerations  Other (comment)   anxiety      Patient will benefit from skilled therapeutic intervention in order to improve the following deficits and impairments:   Cognitive communication deficit    Problem List Patient Active Problem List   Diagnosis Date Noted  . Post concussion syndrome 12/24/2019  . Vitamin D deficiency 10/04/2015  . Chronic sore throat 10/04/2015  . Hemorrhoidal skin tag 08/23/2015  . GERD without esophagitis 08/23/2015  . Chronic constipation 08/01/2015  . 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[place]) 07/22/2015  . Oral thrush 07/22/2015  . Allergic rhinitis with postnasal drip 07/22/2015  . History  of Clostridium difficile colitis 07/18/2015  . Iron deficiency anemia 07/06/2015  . Palpitations 06/26/2015  . Acute urticaria 11/17/2014    Yaiden Yang, Annye Rusk MS,  CCC-SLP 04/11/2020, 1:14 PM  [facility] [address] [place], [place], [address] Phone: [phone]   Fax:  [phone]   Name: Dorisann Schwanke Scroggin MRN: [id number] Date of Birth: 1996-07-17
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
## 2020-04-19 ENCOUNTER — Ambulatory Visit: Payer: Federal, State, Local not specified - PPO | Admitting: Family Medicine

## 2020-04-21 ENCOUNTER — Ambulatory Visit: Payer: Federal, State, Local not specified - PPO | Admitting: Speech Pathology

## 2020-04-21 ENCOUNTER — Ambulatory Visit: Payer: Federal, State, Local not specified - PPO

## 2020-04-25 ENCOUNTER — Encounter: Payer: Self-pay | Admitting: Speech Pathology

## 2020-04-25 ENCOUNTER — Ambulatory Visit: Payer: Federal, State, Local not specified - PPO | Attending: Family Medicine | Admitting: Speech Pathology

## 2020-04-25 ENCOUNTER — Other Ambulatory Visit: Payer: Self-pay

## 2020-04-25 ENCOUNTER — Ambulatory Visit: Payer: Federal, State, Local not specified - PPO

## 2020-04-25 ENCOUNTER — Ambulatory Visit: Payer: Federal, State, Local not specified - PPO | Admitting: Family Medicine

## 2020-04-25 DIAGNOSIS — R41841 Cognitive communication deficit: Secondary | ICD-10-CM | POA: Insufficient documentation

## 2020-04-25 DIAGNOSIS — F0781 Postconcussional syndrome: Secondary | ICD-10-CM | POA: Diagnosis present
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
## 2020-04-25 NOTE — Therapy (Signed)
[facility] [address] [place], [place], [address] Phone: [phone]   Fax:  [phone]  Speech Language Pathology Treatment & Discharge Summary  Patient Details  Name: Jennifer Byrd MRN: [id number] Date of Birth: [birth year] Referring Provider (SLP): Dr. Delene Ruffini   Encounter Date: 04/25/2020  End of Session - 04/25/20 1307    Visit Number  3    Number of Visits  13    Date for SLP Re-Evaluation  05/18/20    SLP Start Time  8101    SLP Stop Time   1300    SLP Time Calculation (min)  29 min    Activity Tolerance  Patient tolerated treatment well       Past Medical History:  Diagnosis Date  . Acid reflux   . Allergy   . Anemia   . Anxiety   . Heart palpitations   . Strep throat     Past Surgical History:  Procedure Laterality Date  . wisdon teeth      There were no vitals filed for this visit.  Subjective Assessment - 04/25/20 1241    Subjective  "I have a GI appointment on Thursday I think    Currently in Pain?  No/denies            ADULT SLP TREATMENT - 04/25/20 1242      General Information   Behavior/Cognition  Alert;Cooperative;Pleasant mood      Treatment Provided   Treatment provided  Cognitive-Linquistic      Cognitive-Linquistic Treatment   Treatment focused on  Cognition;Patient/family/caregiver education    Skilled Treatment  Pt reports she asked her friends and family if they have noted cognitive changes. All have denied cognitive changes in [employer]. Jennifer Byrd reports successful management of finances and feels she is back to baseline with attention, memory processing. She continues LPR meds. and continues to report globus sensation with weight loss. She is following up with GI, I explained MBSS if this conitnues she may request MBSS from [facility].      Assessment / Recommendations / Plan   Plan  Discharge SLP treatment due to (comment)   cognition back at baseline     Progression  Toward Goals   Progression toward goals  Goals met, education completed, patient discharged from SLP       SLP Education - 04/25/20 1303    Education Details  MBSS vs barium swallow    Person(s) Educated  Patient    Methods  Explanation;Handout    Comprehension  Verbalized understanding       SPEECH THERAPY DISCHARGE SUMMARY  Visits from Start of Care: 3  Current functional level related to goals / functional outcomes: See goals below, cognitive impairment resolved   Remaining deficits: dysphagia   Education / Equipment: Compensations for cognition Plan: Patient agrees to discharge.  Patient goals were not met. Patient is being discharged due to meeting the stated rehab goals.  ?????        SLP Short Term Goals - 04/25/20 1306      SLP SHORT TERM GOAL #1   Title  Pt will uitlize journal to recall events, conversations and pertinent infomation with occasional min A over 2 sessions    Time  3    Period  Weeks    Status  Deferred      SLP SHORT TERM GOAL #2   Title  Pt will utilize compenstions for attention and processing during note taking taks with occasional  min A    Time  3    Period  Weeks    Status  Deferred      SLP SHORT TERM GOAL #3   Title  Pt will implement 2 reflux precautions for LPR with rare min A    Time  3    Period  Weeks    Status  Achieved       SLP Long Term Goals - 04/25/20 1306      SLP LONG TERM GOAL #1   Title  Pt will utilize compensations for memory and attention for financial management and management of medical/insurance information with rare min A over 2 sessions    Time  6    Period  Weeks    Status  Achieved      SLP LONG TERM GOAL #2   Title  Pt will generate 4 accomondations she can carryover in the classroom to compensate for memory, attention and processing with mod I    Time  6    Period  Weeks    Status  Deferred      SLP LONG TERM GOAL #3   Title  Pt will carryover 3 vocal hygiene strastegies over 2 sessions  with mod I    Time  6    Period  Weeks    Status  Deferred       Plan - 04/25/20 1303    Clinical Impression Statement  Jennifer Byrd reports her family and she feel she at baseline with cognition. She is bill paying, managing IADL's independently and feels any cognitive impariments have resolved. Jennifer Byrd is requesting to transfer for [facility] for PT and be d/c'd from [facility]. I am in agreement. Diffculty swallowoing persists - GI apppointment this week. May consider MBSS at [facility] vs [facility] is dysphagia persists. D/C ST at this time    Speech Therapy Frequency  2x / week    Duration  --   6 weeks or 13 visits   Treatment/Interventions  Aspiration precaution training;Cueing hierarchy;SLP instruction and feedback;Compensatory strategies;Functional tasks;Pharyngeal strengthening exercises;Diet toleration management by SLP;Trials of upgraded texture/liquids;Internal/external aids;Multimodal communcation approach;Patient/family education;Cognitive reorganization;Compensatory techniques;Other (comment)    Potential to Achieve Goals  Good       Patient will benefit from skilled therapeutic intervention in order to improve the following deficits and impairments:   Cognitive communication deficit    Problem List Patient Active Problem List   Diagnosis Date Noted  . Post concussion syndrome 12/24/2019  . Vitamin D deficiency 10/04/2015  . Chronic sore throat 10/04/2015  . Hemorrhoidal skin tag 08/23/2015  . GERD without esophagitis 08/23/2015  . Chronic constipation 08/01/2015  . 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[place]) 07/22/2015  . Oral thrush 07/22/2015  . Allergic rhinitis with postnasal drip 07/22/2015  . History of Clostridium difficile colitis 07/18/2015  . Iron deficiency anemia 07/06/2015  . Palpitations 06/26/2015  . Acute urticaria 11/17/2014    Jennifer Byrd, Annye Rusk MS, CCC-SLP 04/25/2020, 1:08 PM  [facility] [address] [place], [place],  [address] Phone: [phone]   Fax:  [phone]   Name: Jennifer Byrd MRN: [id number] Date of Birth: [birth year]

## 2020-04-25 NOTE — Patient Instructions (Signed)
   Modified Barium Swallow with ST Study if you continue to have difficulty swallowing after GI  This study looks specifically at swallowing in your throat. The GI looks mostly at your esophagus  [facility] can do Modified Barium Swallow Study if you doctor orders  -   Ask any of your doctors for a referral for Modified Barium Swallow

## 2020-04-26 ENCOUNTER — Encounter: Payer: Self-pay | Admitting: Family Medicine

## 2020-04-26 ENCOUNTER — Ambulatory Visit (INDEPENDENT_AMBULATORY_CARE_PROVIDER_SITE_OTHER): Payer: Federal, State, Local not specified - PPO | Admitting: Family Medicine

## 2020-04-26 VITALS — BP 110/70 | HR 98 | Temp 97.3°F | Resp 16 | Ht 66.0 in | Wt 125.3 lb

## 2020-04-26 DIAGNOSIS — F341 Dysthymic disorder: Secondary | ICD-10-CM | POA: Diagnosis not present

## 2020-04-26 DIAGNOSIS — R0602 Shortness of breath: Secondary | ICD-10-CM

## 2020-04-26 DIAGNOSIS — D562 Delta-beta thalassemia: Secondary | ICD-10-CM | POA: Diagnosis not present

## 2020-04-26 DIAGNOSIS — R634 Abnormal weight loss: Secondary | ICD-10-CM

## 2020-04-26 DIAGNOSIS — R1310 Dysphagia, unspecified: Secondary | ICD-10-CM

## 2020-04-26 DIAGNOSIS — K589 Irritable bowel syndrome without diarrhea: Secondary | ICD-10-CM | POA: Insufficient documentation

## 2020-04-26 DIAGNOSIS — R1319 Other dysphagia: Secondary | ICD-10-CM
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
## 2020-04-27 ENCOUNTER — Ambulatory Visit: Payer: Federal, State, Local not specified - PPO | Admitting: Family Medicine

## 2020-04-27 ENCOUNTER — Other Ambulatory Visit: Payer: Self-pay

## 2020-04-27 ENCOUNTER — Ambulatory Visit: Payer: Federal, State, Local not specified - PPO | Attending: Family Medicine

## 2020-04-27 ENCOUNTER — Encounter: Payer: Self-pay | Admitting: Family Medicine

## 2020-04-27 DIAGNOSIS — M542 Cervicalgia: Secondary | ICD-10-CM | POA: Insufficient documentation

## 2020-04-27 DIAGNOSIS — F0781 Postconcussional syndrome: Secondary | ICD-10-CM | POA: Insufficient documentation

## 2020-04-27 DIAGNOSIS — R42 Dizziness and giddiness: Secondary | ICD-10-CM | POA: Diagnosis present

## 2020-04-27 NOTE — Patient Instructions (Signed)
Access Code: [email or web address] URL: [email or web address] Date: 04/27/2020 Prepared by: Ria Comment  Exercises Seated Gaze Stabilization with Head Rotation - 4 x daily - 7 x weekly - 3 reps - 60 seconds hold  Patient Education Concussion
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
## 2020-04-28 ENCOUNTER — Encounter: Payer: Self-pay | Admitting: Gastroenterology

## 2020-04-28 ENCOUNTER — Ambulatory Visit (INDEPENDENT_AMBULATORY_CARE_PROVIDER_SITE_OTHER): Payer: Federal, State, Local not specified - PPO | Admitting: Gastroenterology

## 2020-04-28 ENCOUNTER — Other Ambulatory Visit: Payer: Self-pay

## 2020-04-28 ENCOUNTER — Other Ambulatory Visit: Payer: Self-pay | Admitting: Family Medicine

## 2020-04-28 VITALS — BP 110/70 | HR 91 | Temp 97.5°F | Ht 66.0 in | Wt 125.8 lb

## 2020-04-28 DIAGNOSIS — R1310 Dysphagia, unspecified: Secondary | ICD-10-CM

## 2020-04-28 DIAGNOSIS — Z8616 Personal history of COVID-19: Secondary | ICD-10-CM
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
## 2020-04-28 NOTE — Progress Notes (Unsigned)
coi

## 2020-04-28 NOTE — Telephone Encounter (Signed)
Copied from CRM [id number]. Topic: General - Other >> Apr 27, 2020  2:33 PM Dalphine Handing A wrote: Patient would like a callback from Dr. Carlynn Purl nurse in regards to getting an order placed for a covid antibody test. Please advise

## 2020-05-02 ENCOUNTER — Ambulatory Visit: Payer: Federal, State, Local not specified - PPO

## 2020-05-02 ENCOUNTER — Ambulatory Visit: Payer: Federal, State, Local not specified - PPO | Admitting: Speech Pathology

## 2020-05-03 ENCOUNTER — Other Ambulatory Visit: Payer: Self-pay

## 2020-05-03 ENCOUNTER — Other Ambulatory Visit: Payer: Self-pay | Admitting: Family Medicine

## 2020-05-03 DIAGNOSIS — R1310 Dysphagia, unspecified: Secondary | ICD-10-CM

## 2020-05-04 LAB — SAR COV2 SEROLOGY (COVID19)AB(IGG),IA: DiaSorin SARS-CoV-2 Ab, IgG: POSITIVE

## 2020-05-05 ENCOUNTER — Encounter: Payer: Federal, State, Local not specified - PPO | Admitting: Speech Pathology

## 2020-05-05 ENCOUNTER — Ambulatory Visit: Payer: Federal, State, Local not specified - PPO

## 2020-05-05 ENCOUNTER — Other Ambulatory Visit: Payer: Self-pay

## 2020-05-05 ENCOUNTER — Emergency Department: Payer: Federal, State, Local not specified - PPO

## 2020-05-05 ENCOUNTER — Emergency Department
Admission: EM | Admit: 2020-05-05 | Discharge: 2020-05-05 | Disposition: A | Discharge status: Home / Self Care | Payer: Federal, State, Local not specified - PPO | Attending: Emergency Medicine | Admitting: Emergency Medicine

## 2020-05-05 ENCOUNTER — Ambulatory Visit
Admission: RE | Admit: 2020-05-05 | Discharge: 2020-05-05 | Disposition: A | Discharge status: Home / Self Care | Payer: Federal, State, Local not specified - PPO | Source: Ambulatory Visit | Attending: Gastroenterology | Admitting: Gastroenterology

## 2020-05-05 VITALS — BP 122/74 | HR 94

## 2020-05-05 DIAGNOSIS — Z5321 Procedure and treatment not carried out due to patient leaving prior to being seen by health care provider: Secondary | ICD-10-CM | POA: Insufficient documentation

## 2020-05-05 DIAGNOSIS — R1310 Dysphagia, unspecified: Secondary | ICD-10-CM | POA: Insufficient documentation

## 2020-05-05 DIAGNOSIS — R0789 Other chest pain: Secondary | ICD-10-CM | POA: Insufficient documentation

## 2020-05-05 DIAGNOSIS — F0781 Postconcussional syndrome: Secondary | ICD-10-CM

## 2020-05-05 LAB — BASIC METABOLIC PANEL
Anion gap: 7 (ref 5–15)
BUN: 8 mg/dL (ref 6–20)
CO2: 26 mmol/L (ref 22–32)
Calcium: 9.1 mg/dL (ref 8.9–10.3)
Chloride: 105 mmol/L (ref 98–111)
Creatinine, Ser: 0.71 mg/dL (ref 0.44–1.00)
GFR calc Af Amer: >60 mL/min (ref >60)
GFR calc non Af Amer: >60 mL/min (ref >60)
Glucose, Bld: 94 mg/dL (ref 70–99)
Potassium: 3.6 mmol/L (ref 3.5–5.1)
Sodium: 138 mmol/L (ref 135–145)

## 2020-05-05 LAB — CBC
HCT: 38.4 % (ref 36.0–46.0)
Hemoglobin: 13.2 g/dL (ref 12.0–15.0)
MCH: 26.1 pg (ref 26.0–34.0)
MCHC: 34.4 g/dL (ref 30.0–36.0)
MCV: 76 fL — ABNORMAL LOW (ref 80.0–100.0)
Platelets: 243 10*3/uL (ref 150–400)
RBC: 5.05 MIL/uL (ref 3.87–5.11)
RDW: 14.4 % (ref 11.5–15.5)
WBC: 4.5 10*3/uL (ref 4.0–10.5)
nRBC: 0 % (ref 0.0–0.2)

## 2020-05-05 LAB — TROPONIN I (HIGH SENSITIVITY)
Troponin I (High Sensitivity): <2 ng/L (ref <18)
Troponin I (High Sensitivity): <2 ng/L (ref <18)

## 2020-05-05 IMAGING — CR DG CHEST 2V
1 series · 2 of 2 positions shown · non-contrast
Comparison: [DATE]

CLINICAL DATA: Medial chest pain, increasing in severity, chest
pressure and tightness

EXAM:
CHEST - 2 VIEW

[Series 1: dg chest 2 view · 0.14mm/px · 2 of 2 slices shown]
[im 1/2]
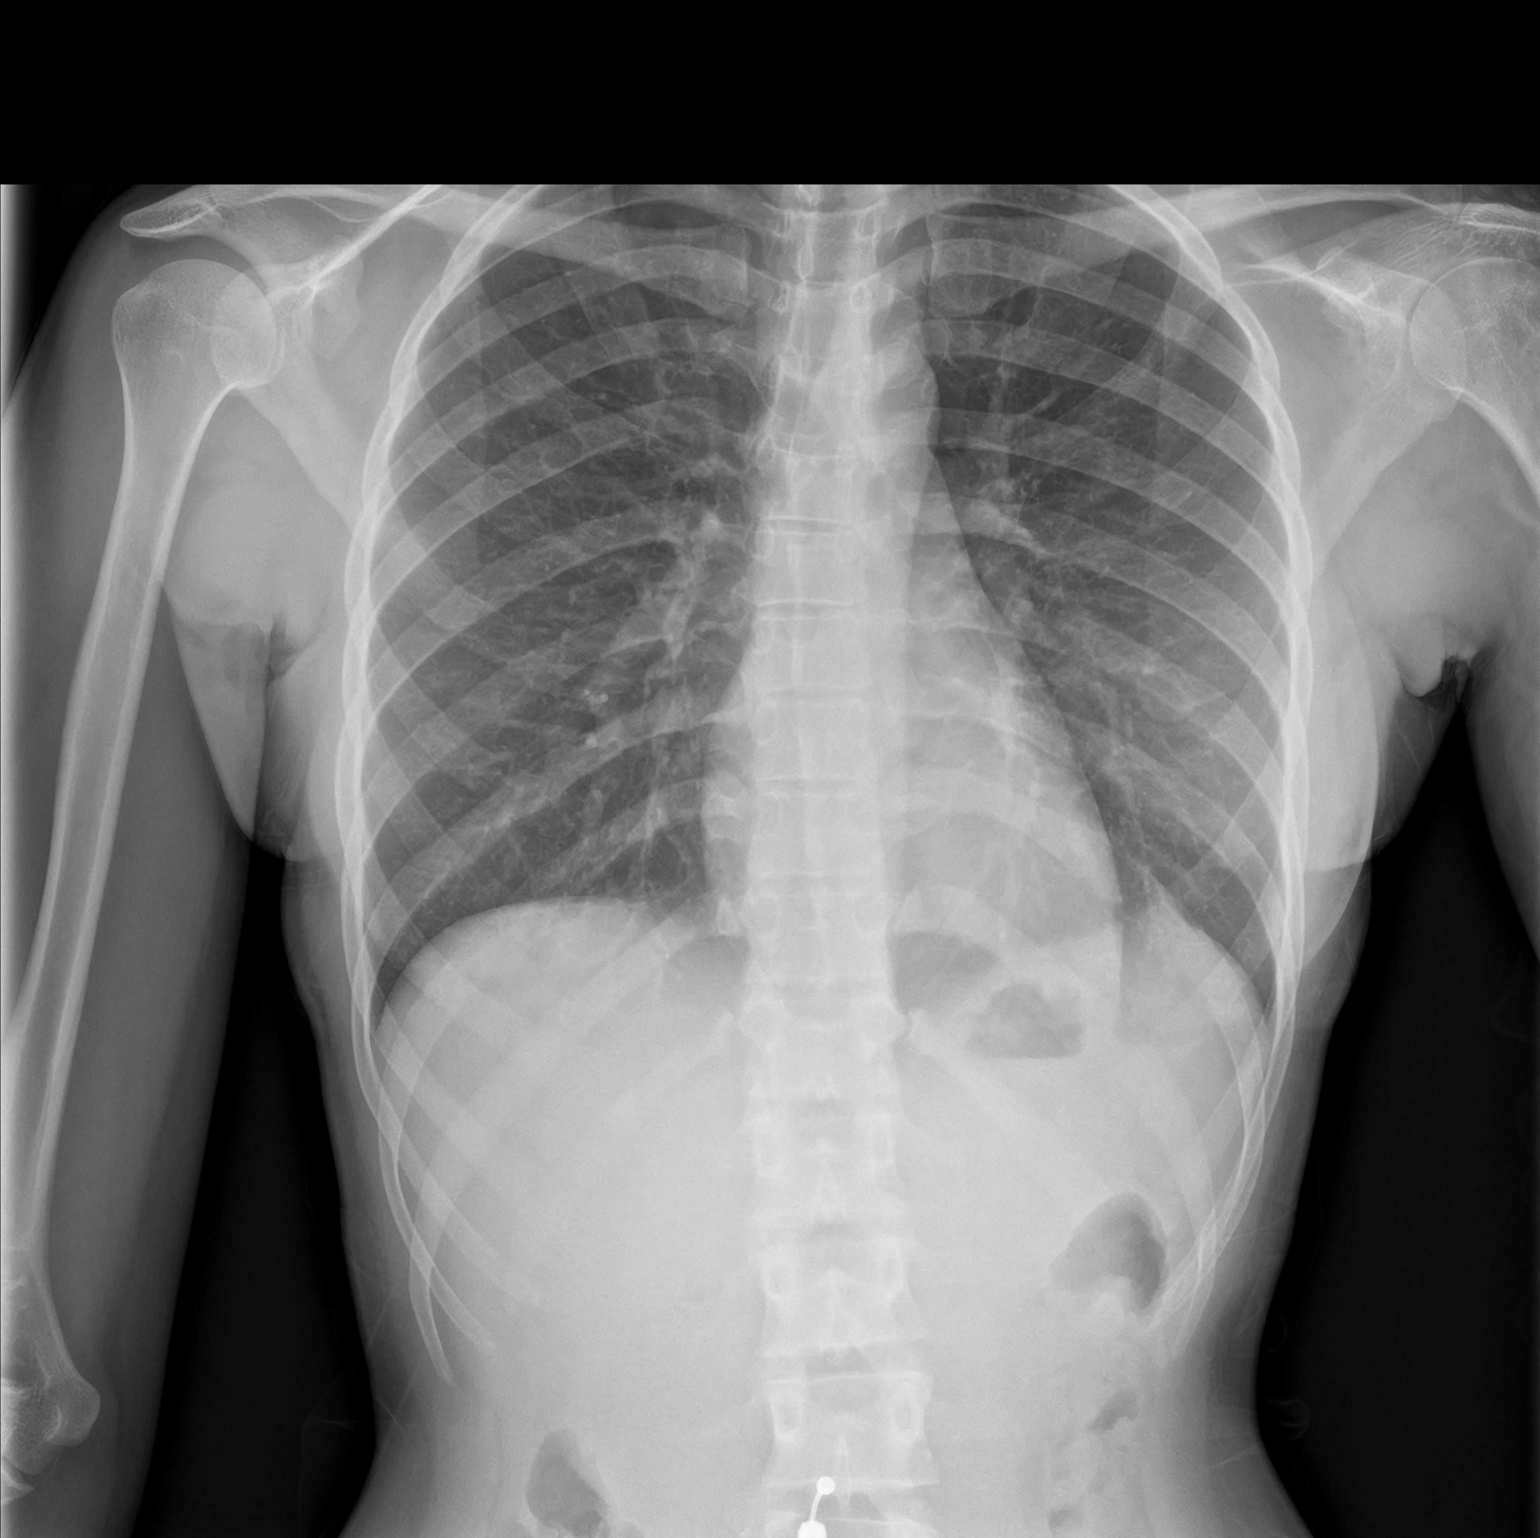
[im 2/2]
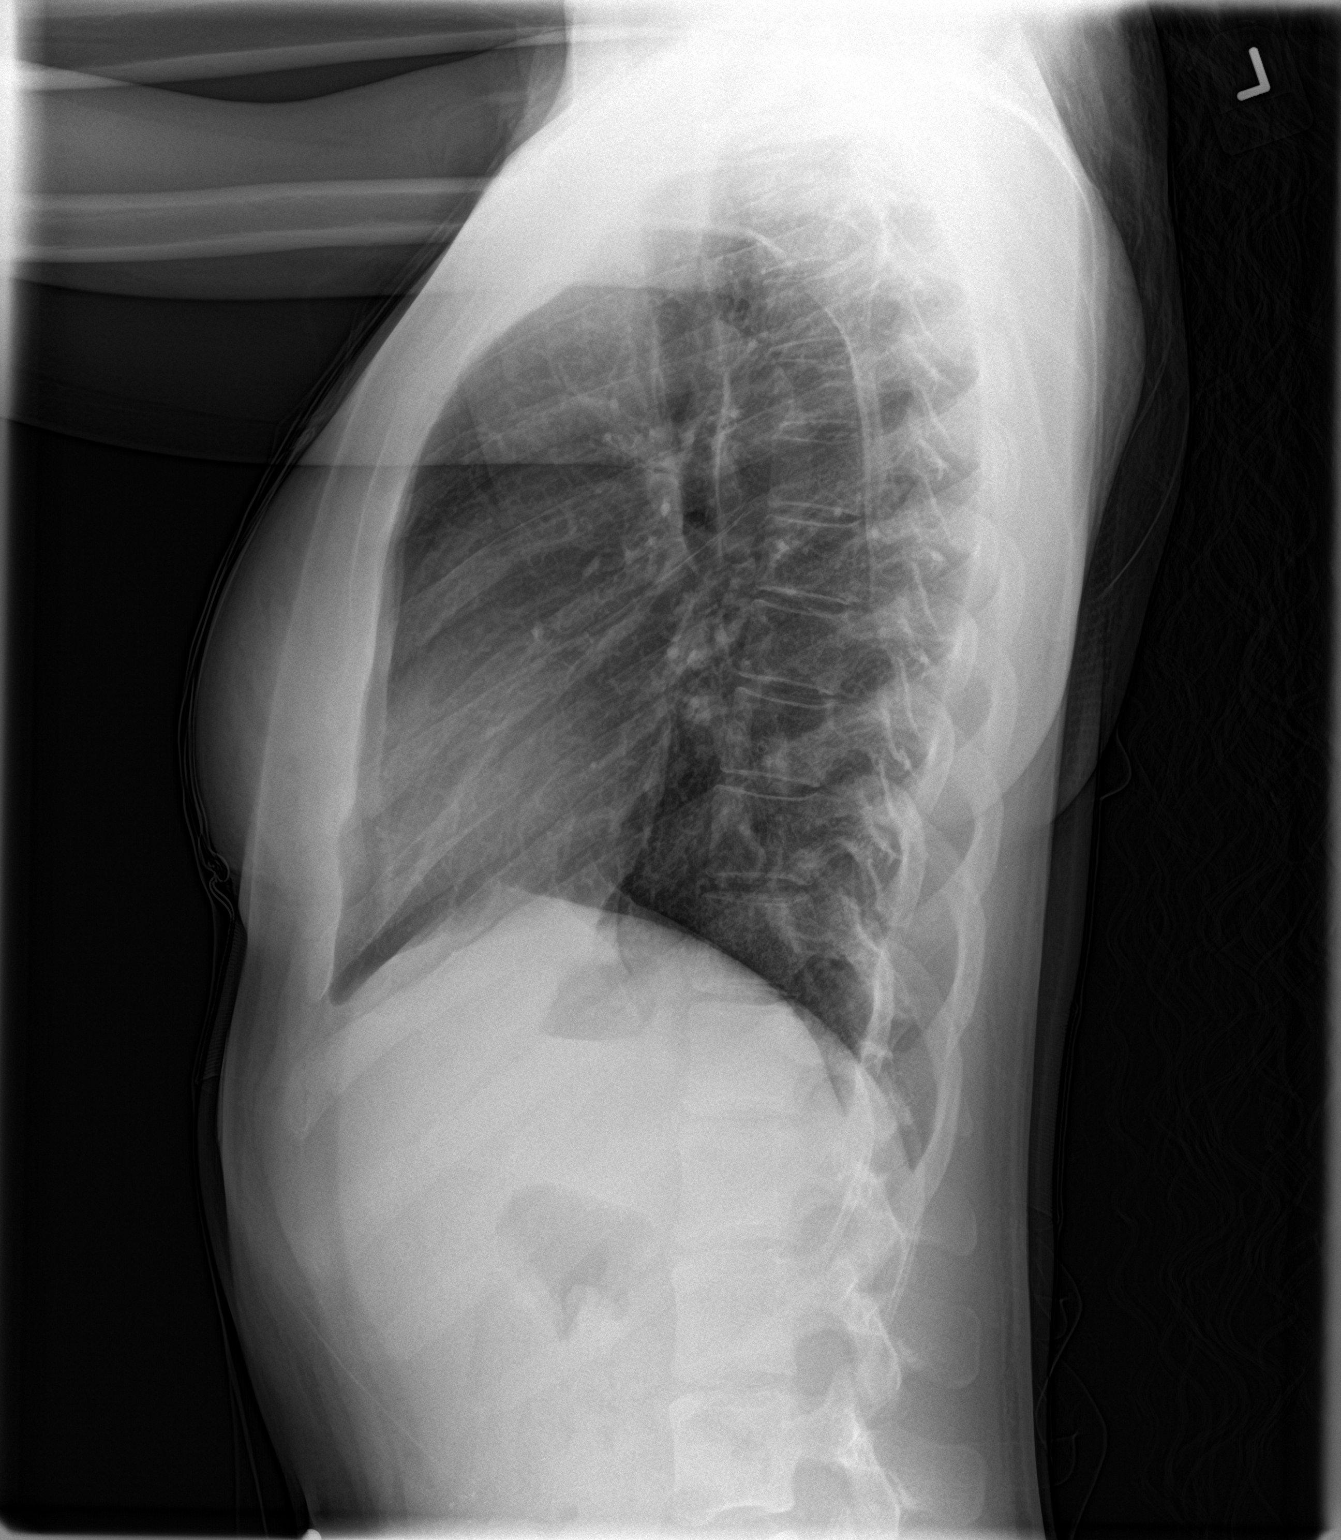

[2 of 2 positions shown; findings below may reference images not displayed]

FINDINGS: The heart size and mediastinal contours are within normal limits.
Both lungs are clear. The visualized skeletal structures are
unremarkable.
IMPRESSION: No active cardiopulmonary disease.
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
## 2020-05-05 NOTE — ED Triage Notes (Signed)
Patient c/o medial chest pain beginning Sunday with increasing severity today. Patient describes pain as pressure/tightness. Patient denies accompanying symptoms.

## 2020-05-06 ENCOUNTER — Telehealth: Payer: Self-pay

## 2020-05-06 NOTE — Telephone Encounter (Signed)
Copied from CRM [id number]. Topic: General - Other >> May 06, 2020  9:22 AM Jennifer Byrd wrote: Patient requesting call back from clinical staff to discuss ED visit last night. Patient would not disclose any additional information.   She was having chest pain and ER and the wait was to long. She left ED and UC and was evaluated for chest pain and palpitations. She was given atenolol for palpitations. She just wanted to let you know that she is fine.

## 2020-05-09 ENCOUNTER — Encounter: Payer: Federal, State, Local not specified - PPO | Admitting: Speech Pathology

## 2020-05-09 ENCOUNTER — Ambulatory Visit: Payer: Federal, State, Local not specified - PPO

## 2020-05-12 ENCOUNTER — Ambulatory Visit: Payer: Federal, State, Local not specified - PPO

## 2020-05-12 ENCOUNTER — Encounter: Payer: Federal, State, Local not specified - PPO | Admitting: Speech Pathology

## 2020-05-12 ENCOUNTER — Other Ambulatory Visit: Payer: Self-pay

## 2020-05-12 DIAGNOSIS — R42 Dizziness and giddiness: Secondary | ICD-10-CM | POA: Diagnosis not present

## 2020-05-12 DIAGNOSIS — F0781 Postconcussional syndrome: Secondary | ICD-10-CM
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
## 2020-05-16 ENCOUNTER — Other Ambulatory Visit: Payer: Self-pay

## 2020-05-16 ENCOUNTER — Encounter: Payer: Federal, State, Local not specified - PPO | Admitting: Speech Pathology

## 2020-05-16 ENCOUNTER — Ambulatory Visit: Payer: Federal, State, Local not specified - PPO

## 2020-05-16 DIAGNOSIS — R42 Dizziness and giddiness: Secondary | ICD-10-CM

## 2020-05-16 DIAGNOSIS — M542 Cervicalgia: Secondary | ICD-10-CM
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
## 2020-05-19 ENCOUNTER — Ambulatory Visit: Payer: Federal, State, Local not specified - PPO

## 2020-05-19 ENCOUNTER — Encounter: Payer: Federal, State, Local not specified - PPO | Admitting: Speech Pathology

## 2020-05-25 ENCOUNTER — Encounter: Payer: Self-pay | Admitting: Family Medicine

## 2020-05-31 ENCOUNTER — Ambulatory Visit: Payer: Self-pay | Admitting: Family Medicine

## 2020-05-31 NOTE — Telephone Encounter (Signed)
Pt reports sore throat, onset 5-6 days ago. Went to UC yesterday, negative for strep, rapid Covid test negative. Reports dry cough at HS, "Little congested."  Denies fever, states "Urine hot when I urinate." Denies any dysuria. "Just hot." States is not drinking much as "Hurts to swallow." States back of throat is red, inflamed, no white pustules. States UC told her to take IBU, states "Helps a little." Questioning if she should be taking something else, would like Dr. Carlynn Purl advise.  Call during practice's lunch hour. Please advise: [phone]  Reason for Disposition . [1] Sore throat with cough/cold symptoms AND [2] present > 5 days    Tested negative for strep yesterday at [facility]  Answer Assessment - Initial Assessment Questions 1. ONSET: "When did the throat start hurting?" (Hours or days ago)      5-6 days ago 2. SEVERITY: "How bad is the sore throat?" (Scale 1-10; mild, moderate or severe)   - MILD (1-3):  doesn't interfere with eating or normal activities   - MODERATE (4-7): interferes with eating some solids and normal activities   - SEVERE (8-10):  excruciating pain, interferes with most normal activities   - SEVERE DYSPHAGIA: can't swallow liquids, drooling     8/10 3. STREP EXPOSURE: "Has there been any exposure to strep within the past week?" If so, ask: "What type of contact occurred?"      Tested negative for strep yesterday 4.  VIRAL SYMPTOMS: "Are there any symptoms of a cold, such as a runny nose, cough, hoarse voice or red eyes?"      Dry cough at HS, "Little congested" 5. FEVER: "Do you have a fever?" If so, ask: "What is your temperature, how was it measured, and when did it start?"     no 6. PUS ON THE TONSILS: "Is there pus on the tonsils in the back of your throat?"     Throat red, inflamed. 7. OTHER SYMPTOMS: "Do you have any other symptoms?" (e.g., difficulty breathing, headache, rash)    "Urine hot" no dysuria  Protocols used: SORE THROAT-A-AH

## 2020-06-01 ENCOUNTER — Ambulatory Visit: Payer: Federal, State, Local not specified - PPO | Admitting: Family Medicine

## 2020-06-01 NOTE — Telephone Encounter (Signed)
Tried contacting pt to schedule appt

## 2020-06-02 ENCOUNTER — Ambulatory Visit (INDEPENDENT_AMBULATORY_CARE_PROVIDER_SITE_OTHER): Payer: Federal, State, Local not specified - PPO | Admitting: Internal Medicine

## 2020-06-02 ENCOUNTER — Other Ambulatory Visit: Payer: Self-pay

## 2020-06-02 ENCOUNTER — Encounter: Payer: Self-pay | Admitting: Internal Medicine

## 2020-06-02 VITALS — BP 116/78 | HR 67 | Temp 98.5°F | Resp 16 | Ht 66.0 in | Wt 124.3 lb

## 2020-06-02 DIAGNOSIS — J309 Allergic rhinitis, unspecified: Secondary | ICD-10-CM | POA: Diagnosis not present

## 2020-06-02 DIAGNOSIS — J069 Acute upper respiratory infection, unspecified: Secondary | ICD-10-CM | POA: Diagnosis not present

## 2020-06-02 DIAGNOSIS — K219 Gastro-esophageal reflux disease without esophagitis: Secondary | ICD-10-CM

## 2020-06-02 DIAGNOSIS — F418 Other specified anxiety disorders: Secondary | ICD-10-CM

## 2020-06-02 DIAGNOSIS — J029 Acute pharyngitis, unspecified: Secondary | ICD-10-CM

## 2020-06-02 DIAGNOSIS — F321 Major depressive disorder, single episode, moderate: Secondary | ICD-10-CM

## 2020-06-02 NOTE — Progress Notes (Signed)
Patient ID: Jennifer Byrd, female    DOB: 1996/10/12, 24 y.o.   MRN: [id number]  PCP: Steele Sizer, MD  Chief Complaint  Patient presents with  . Sore Throat    started about a week and a half ago, negative for covid and strep, more painful on right side, feels like food gets stuck in her throat    Subjective:   Jennifer Byrd is a 24 y.o. female, presents to clinic with CC of the following:  Chief Complaint  Patient presents with  . Sore Throat    started about a week and a half ago, negative for covid and strep, more painful on right side, feels like food gets stuck in her throat    HPI:  Patient is a 24 year old female patient of Dr. Ancil Boozer Follows up today with persistent sore throat.  She notes she has had a sore throat now for 6 to 7 days, had a Covid test and a strep test done last Friday, and [place] at an urgent care, and then she again went to urgent care on 6/7 back in this area and her strep test was negative, her rapid Covid test was negative.  She notes her throat soreness is worse in the morning, sometimes gets better as the day progresses, and then can be more problematic again later in the day.  Denies any weight spots in the back of her throat. Had a dry cough when her symptoms started, although in the last couple days the cough has been improved.  Has not really had a cough. Mild congestion and postnasal drip symptoms, seemingly alternating at times. Denies fever No loss of taste or smell Denies diarrhea or abdominal pains, No nausea/vomiting  Has tried to take Zyrtec and Flonase products, although she stopped the Flonase yesterday as it was increasing her postnasal drip she thought.  Also continues to take omeprazole 40 mg for her reflux.  Was also taking ibuprofen after seeing urgent care with possible costochondral concern noted at that time.  She has also been using a humidifier to sleep.  Of note, in February she had an EGD and was told  after to increase her omeprazole to 40 mg daily.  She also had a swallowing study done in May, for difficulty swallowing, although she notes she was never told what the results were.  Trying to look up the results on epic, it was stated result not yet final.  She has a history of anxiety and depression, and states she is on the wait list for third weight counseling.  She is not taking any medications presently.  Her GAD-7 and PHQ-9 were reviewed.  She states that she was more anxious with some of the screening questions asked prior to the medical visit, and feels better after our visit the day.  She checked a 1 for suicidal thoughts, and she denied any thoughts of hurting self or hurting others recently, with no active plans.  She stated it was a fleeting thoughts of being better off dead immediately prior to this visit, and reassured me again she has no recent thoughts of hurting herself or others.  Patient Active Problem List   Diagnosis Date Noted  . Irritable bowel syndrome 04/26/2020  . Dysthymia 04/26/2020  . Post-concussion headache 01/05/2020  . Anxiety about health 12/31/2019  . Headache disorder 12/28/2019  . Neck pain 12/28/2019  . Post concussion syndrome 12/24/2019  . Dyspnea 10/05/2016  . Allergic rhinitis due to house dust mite  01/23/2016  . Vitamin D deficiency 10/04/2015  . Hemorrhoidal skin tag 08/23/2015  . GERD without esophagitis 08/23/2015  . Chronic constipation 08/01/2015  . 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HCC) 07/22/2015  . Allergic rhinitis with postnasal drip 07/22/2015  . History of Clostridium difficile colitis 07/18/2015  . Iron deficiency anemia 07/06/2015  . Palpitations 06/26/2015      Current Outpatient Medications:  .  Cetirizine HCl 10 MG TBDP, Take by mouth., Disp: , Rfl:  .  fluticasone (FLONASE SENSIMIST) 27.5 MCG/SPRAY nasal spray, Place into the nose., Disp: , Rfl:  .  omeprazole (PRILOSEC) 40 MG capsule, omeprazole 40 mg capsule,delayed release  TAKE 1  CAPSULE BY MOUTH EVERY DAY, Disp: , Rfl:    Allergies  Allergen Reactions  . Penicillins Anaphylaxis  . Azithromycin Hives  . Cefdinir Hives  . Peanut-Containing Drug Products Hives  . Doxycycline Other (See Comments)    Throat irritation, difficulty swallowing.   . Latex Hives  . Meclizine     Unknown reaction per pt  . Amoxicillin Hives, Rash and Other (See Comments)    Patient states it makes her neck break out. Patient states it makes her neck break out.  [name] Amoxicillin-Pot Clavulanate Rash     Past Surgical History:  Procedure Laterality Date  . wisdon teeth       Family History  Problem Relation Age of Onset  . Diabetes Mother   . Hypothyroidism Mother   . Diabetes Father   . Hypertension Father   . Hyperlipidemia Father   . Hypothyroidism Sister      Social History   Tobacco Use  . Smoking status: Never Smoker  . Smokeless tobacco: Never Used  Substance Use Topics  . Alcohol use: No    With staff assistance, above reviewed with the patient today.  ROS: As per HPI, otherwise no specific complaints on a limited and focused system review   No results found for this or any previous visit (from the past 72 hour(s)).   PHQ2/9: Depression screen [facility] 2/9 06/02/2020 04/26/2020 12/24/2019 05/27/2019 07/22/2015  Decreased Interest 3 0 2 0 0  Down, Depressed, Hopeless 3 0 2 0 0  PHQ - 2 Score 6 0 4 0 0  Altered sleeping 3 0 3 3 -  Tired, decreased energy 3 0 3 0 -  Change in appetite 1 0 3 0 -  Feeling bad or failure about yourself  3 0 2 0 -  Trouble concentrating 1 0 3 0 -  Moving slowly or fidgety/restless 1 0 2 0 -  Suicidal thoughts 1 0 0 0 -  PHQ-9 Score 19 0 20 3 -  Difficult doing work/chores Very difficult Not difficult at all Very difficult Not difficult at all -   PHQ-2/9 Result reviewed  GAD 7 : Generalized Anxiety Score 06/02/2020 12/24/2019  Nervous, Anxious, on Edge 3 3  Control/stop worrying 3 3  Worry too much - different things 3 3    Trouble relaxing 3 3  Restless 3 3  Easily annoyed or irritable 3 3  Afraid - awful might happen 3 3  Total GAD 7 Score 21 21  Anxiety Difficulty Extremely difficult Very difficult    GAD reviewed  Fall Risk: Fall Risk  06/02/2020 04/26/2020 12/24/2019 05/27/2019 07/22/2015  Falls in the past year? 0 0 0 0 No  Number falls in past yr: 0 0 0 0 -  Injury with Fall? 0 0 0 0 -  Follow up - - Falls evaluation  completed - -      Objective:   Vitals:   06/02/20 1434  BP: 116/78  Pulse: 67  Resp: 16  Temp: 98.5 F (36.9 C)  TempSrc: Temporal  SpO2: 98%  Weight: 124 lb 4.8 oz (56.4 kg)  Height: 5\' 6"  (1.676 m)    Body mass index is 20.06 kg/m.  Physical Exam   NAD, masked HEENT - [place]/AT, sclera anicteric, PERRL, EOMI, conj - non-inj'ed, no focal sinus tenderness, nares were patent with no marked erythema, TM's and canals clear, pharynx clear with no exudates, not markedly erythematous on exam today Neck - supple, no adenopathy, no TM, carotids 2+ and = without bruits bilat Car - RRR without m/g/r Pulm- RR and effort normal at rest, CTA without wheeze or rales Abd - soft, NT with no epigastric tenderness, ND Skin- no rash noted on exposed areas,  Neuro/psychiatric - affect was not flat, very appropriate with conversation, speech was not rapid.  Alert and oriented    Results for orders placed or performed during the hospital encounter of 05/05/20  Basic metabolic panel  Result Value Ref Range   Sodium 138 135 - 145 mmol/L   Potassium 3.6 3.5 - 5.1 mmol/L   Chloride 105 98 - 111 mmol/L   CO2 26 22 - 32 mmol/L   Glucose, Bld 94 70 - 99 mg/dL   BUN 8 6 - 20 mg/dL   Creatinine, Ser 05/07/20 0.44 - 1.00 mg/dL   Calcium 9.1 8.9 - 9.21 mg/dL   GFR calc non Af Amer >60 >60 mL/min   GFR calc Af Amer >60 >60 mL/min   Anion gap 7 5 - 15  CBC  Result Value Ref Range   WBC 4.5 4.0 - 10.5 K/uL   RBC 5.05 3.87 - 5.11 MIL/uL   Hemoglobin 13.2 12.0 - 15.0 g/dL   HCT 19.4 36 - 46 %    MCV 76.0 (L) 80.0 - 100.0 fL   MCH 26.1 26.0 - 34.0 pg   MCHC 34.4 30.0 - 36.0 g/dL   RDW 17.4 08.1 - 44.8 %   Platelets 243 150 - 400 K/uL   nRBC 0.0 0.0 - 0.2 %  Troponin I (High Sensitivity)  Result Value Ref Range   Troponin I (High Sensitivity) <2 <18 ng/L  Troponin I (High Sensitivity)  Result Value Ref Range   Troponin I (High Sensitivity) <2 <18 ng/L       Assessment & Plan:   1. Viral upper respiratory tract infection 2. Pharyngitis, unspecified etiology Discussed that she likely had an infectious component when her symptoms started with some cough, some postnasal drip and congestion, and likely a viral source to this.  She noted the mucus was clear when it drained.  Reassured she has had 2 strep test that were negative.  Also reviewed the triad that we follow that includes adenopathy, fevers, and exudates, of which she has not other 3 which is also very reassuring that it is not strep or a related bacterial infection.  Likely still some inflammation causing symptoms, and noted possible contributions from allergies as well as dryness.  Also reflux could be playing a role, although she denies any increase in no symptoms. Felt best to continue to treat symptomatically Recommend returning to using the Flonase and the Zyrtec product presently Also recommend staying well-hydrated, continuing to use the humidifier Also recommend using lozenges or a throat spray to help as needed for the throat soreness Would use Tylenol more than the  ibuprofen or Aleve products as needed for discomfort to lessen potential reflux contributions Also continue the omeprazole product for the reflux.  3. Allergic rhinitis, unspecified seasonality, unspecified trigger As above, continue with the nonsedating antihistamine and the Flonase product and a humidifier.  4. Gastroesophageal reflux disease without esophagitis As above, continue the omeprazole  5. Current moderate episode of major depressive  disorder, unspecified whether recurrent (HCC) 6. Anxiety with depression Concern with her GAD-7 and PHQ-9 responses, especially the 1 for suicidal thoughts.  Upon discussion with her, feel she has no active suicidal thoughts, and do feel seeing counseling would be helpful.  She was unclear how quickly this could happen, and I did mention another resource, [facility] health services which may be worth calling and offered to provide the number.  She noted a concern as she has a friend that works there, I did note that if that is not her Careers information officer, they do keep things very confidential.  May be an option for her as well. Can follow-up if symptoms are more concerning here in the near future as await their involvement.  Also to follow-up if her symptoms are not improving or worsening as we discussed.        Jamelle Haring, MD 06/02/20 2:49 PM

## 2020-06-02 NOTE — Patient Instructions (Signed)
Recommend returning to using the Flonase and the Zyrtec product presently  Also recommend staying well-hydrated, continuing to use the humidifier  Also recommend using lozenges or a throat spray to help as needed for the throat soreness  Would use Tylenol more than the ibuprofen or Aleve products as needed for discomfort to lessen potential reflux contributions  Also continue the omeprazole product for the reflux.   Pharyngitis  Pharyngitis is a sore throat (pharynx). This is when there is redness, pain, and swelling in your throat. Most of the time, this condition gets better on its own. In some cases, you may need medicine. Follow these instructions at home:  Take over-the-counter and prescription medicines only as told by your doctor. ? If you were prescribed an antibiotic medicine, take it as told by your doctor. Do not stop taking the antibiotic even if you start to feel better. ? Do not give children aspirin. Aspirin has been linked to Reye syndrome.  Drink enough water and fluids to keep your pee (urine) clear or pale yellow.  Get a lot of rest.  Rinse your mouth (gargle) with a salt-water mixture 3-4 times a day or as needed. To make a salt-water mixture, completely dissolve -1 tsp of salt in 1 cup of warm water.  If your doctor approves, you may use throat lozenges or sprays to soothe your throat. Contact a doctor if:  You have large, tender lumps in your neck.  You have a rash.  You cough up green, yellow-brown, or bloody spit. Get help right away if:  You have a stiff neck.  You drool or cannot swallow liquids.  You cannot drink or take medicines without throwing up.  You have very bad pain that does not go away with medicine.  You have problems breathing, and it is not from a stuffy nose.  You have new pain and swelling in your knees, ankles, wrists, or elbows. Summary  Pharyngitis is a sore throat (pharynx). This is when there is redness, pain, and  swelling in your throat.  If you were prescribed an antibiotic medicine, take it as told by your doctor. Do not stop taking the antibiotic even if you start to feel better.  Most of the time, pharyngitis gets better on its own. Sometimes, you may need medicine. This information is not intended to replace advice given to you by your health care provider. Make sure you discuss any questions you have with your health care provider. Document Revised: 11/22/2017 Document Reviewed: 01/15/2017 Elsevier Patient Education  2020 [employer].

## 2020-06-04 ENCOUNTER — Other Ambulatory Visit: Payer: Self-pay

## 2020-06-04 ENCOUNTER — Emergency Department
Admission: EM | Admit: 2020-06-04 | Discharge: 2020-06-04 | Disposition: A | Discharge status: Home / Self Care | Payer: Federal, State, Local not specified - PPO | Attending: Emergency Medicine | Admitting: Emergency Medicine

## 2020-06-04 ENCOUNTER — Emergency Department: Payer: Federal, State, Local not specified - PPO

## 2020-06-04 DIAGNOSIS — Y999 Unspecified external cause status: Secondary | ICD-10-CM | POA: Insufficient documentation

## 2020-06-04 DIAGNOSIS — K5909 Other constipation: Secondary | ICD-10-CM | POA: Diagnosis present

## 2020-06-04 DIAGNOSIS — D562 Delta-beta thalassemia: Secondary | ICD-10-CM | POA: Diagnosis present

## 2020-06-04 DIAGNOSIS — Z5321 Procedure and treatment not carried out due to patient leaving prior to being seen by health care provider: Secondary | ICD-10-CM | POA: Diagnosis not present

## 2020-06-04 DIAGNOSIS — Y939 Activity, unspecified: Secondary | ICD-10-CM | POA: Diagnosis not present

## 2020-06-04 DIAGNOSIS — F321 Major depressive disorder, single episode, moderate: Secondary | ICD-10-CM | POA: Diagnosis present

## 2020-06-04 DIAGNOSIS — M542 Cervicalgia: Secondary | ICD-10-CM | POA: Diagnosis present

## 2020-06-04 DIAGNOSIS — R110 Nausea: Secondary | ICD-10-CM | POA: Insufficient documentation

## 2020-06-04 DIAGNOSIS — E559 Vitamin D deficiency, unspecified: Secondary | ICD-10-CM | POA: Diagnosis present

## 2020-06-04 DIAGNOSIS — R4589 Other symptoms and signs involving emotional state: Secondary | ICD-10-CM | POA: Diagnosis present

## 2020-06-04 DIAGNOSIS — S0990XA Unspecified injury of head, initial encounter: Secondary | ICD-10-CM | POA: Insufficient documentation

## 2020-06-04 DIAGNOSIS — J3089 Other allergic rhinitis: Secondary | ICD-10-CM | POA: Diagnosis present

## 2020-06-04 DIAGNOSIS — R0600 Dyspnea, unspecified: Secondary | ICD-10-CM | POA: Diagnosis present

## 2020-06-04 DIAGNOSIS — F0781 Postconcussional syndrome: Secondary | ICD-10-CM | POA: Diagnosis present

## 2020-06-04 DIAGNOSIS — Y92012 Bathroom of single-family (private) house as the place of occurrence of the external cause: Secondary | ICD-10-CM | POA: Diagnosis not present

## 2020-06-04 DIAGNOSIS — R519 Headache, unspecified: Secondary | ICD-10-CM | POA: Diagnosis present

## 2020-06-04 DIAGNOSIS — J309 Allergic rhinitis, unspecified: Secondary | ICD-10-CM | POA: Diagnosis present

## 2020-06-04 DIAGNOSIS — W228XXA Striking against or struck by other objects, initial encounter: Secondary | ICD-10-CM | POA: Diagnosis not present

## 2020-06-04 DIAGNOSIS — D509 Iron deficiency anemia, unspecified: Secondary | ICD-10-CM | POA: Diagnosis present

## 2020-06-04 DIAGNOSIS — F418 Other specified anxiety disorders: Secondary | ICD-10-CM | POA: Diagnosis present

## 2020-06-04 IMAGING — CT CT HEAD W/O CM
3 series · 15 of 47 positions shown, 18 images · non-contrast
Comparison: [DATE]

CLINICAL DATA: Heavy object fell on head.  Nausea

EXAM:
CT HEAD WITHOUT CONTRAST
TECHNIQUE: Contiguous axial images were obtained from the base of the skull
through the vertex without intravenous contrast.

[Series 2: head wo · axial · 0.44mm/px · z∈[-134,-9]mm · 9 of 30 slices shown, 12 images]
[im 3/30  brain]
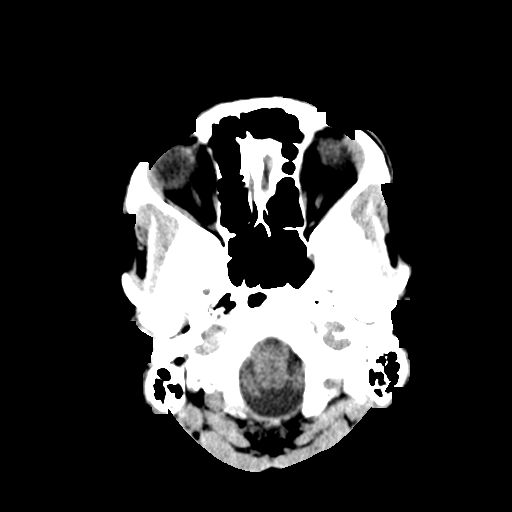
[im 3/30  bone]
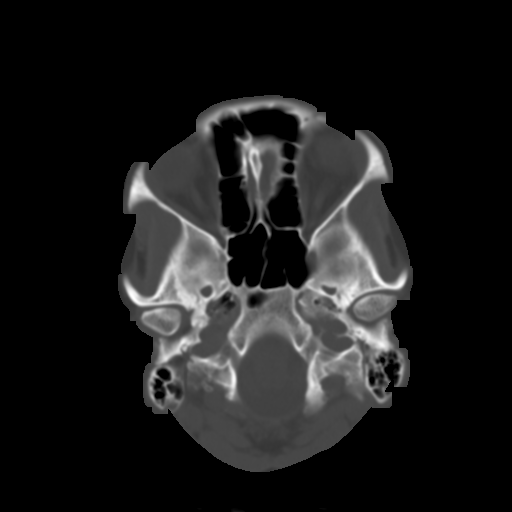
[im 6/30  brain]
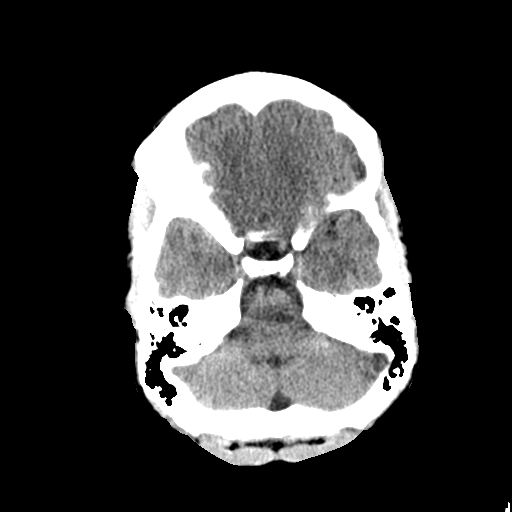
[im 9/30  brain]
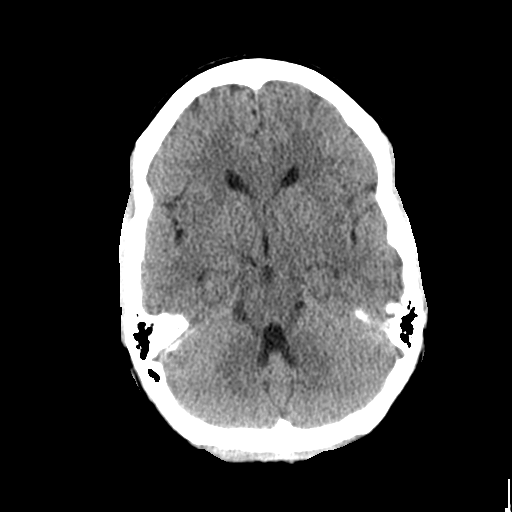
[im 12/30  brain]
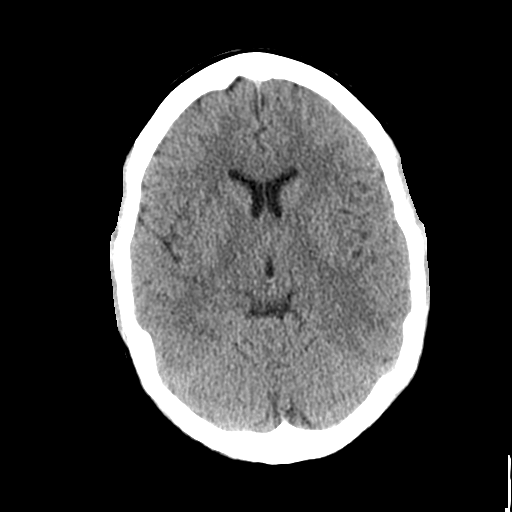
[im 16/30  brain]
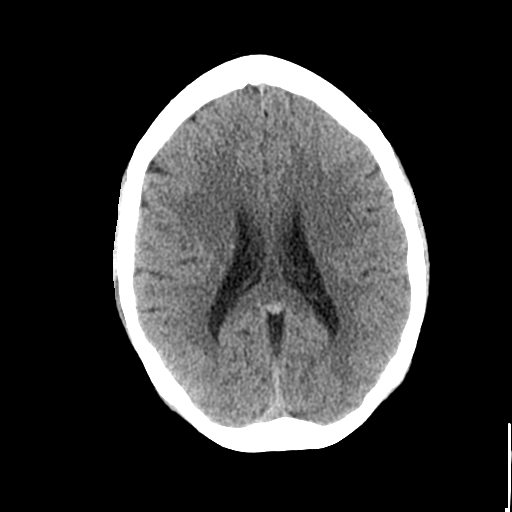
[im 16/30  bone]
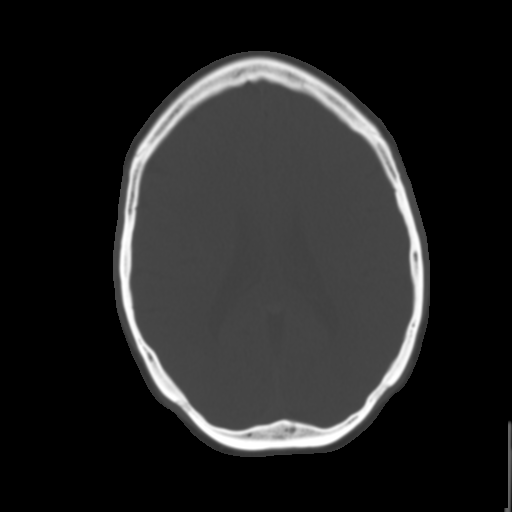
[im 19/30  brain]
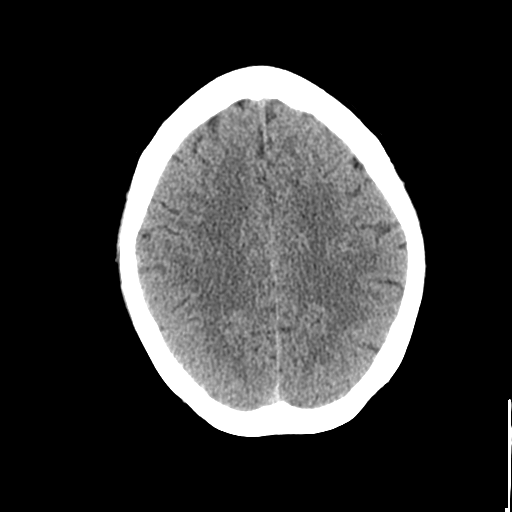
[im 22/30  brain]
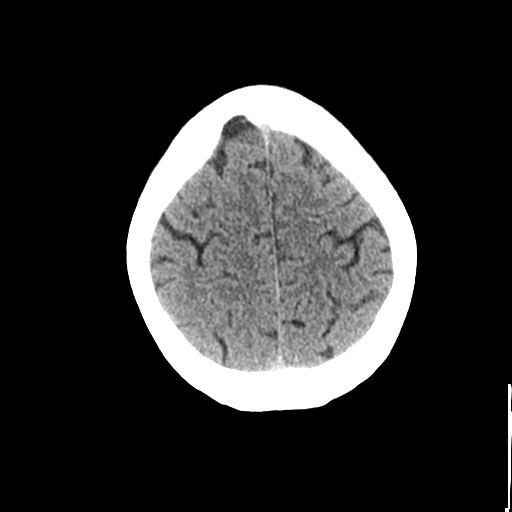
[im 25/30  brain]
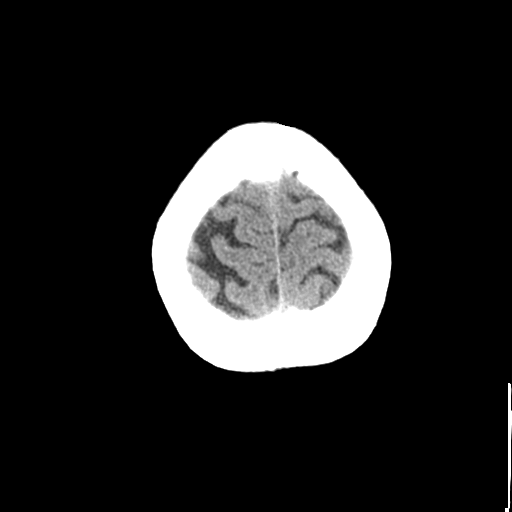
[im 28/30  brain]
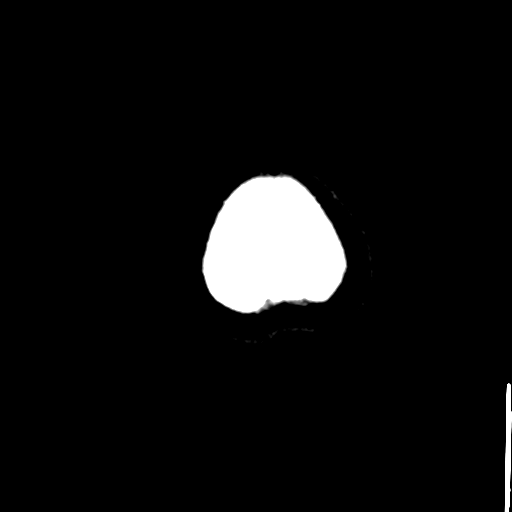
[im 28/30  bone]
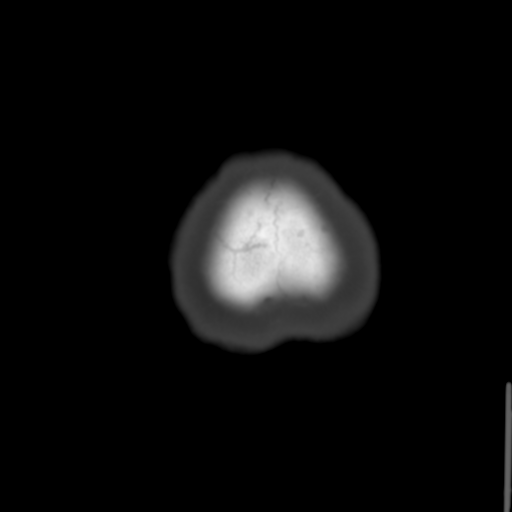

[Series 4: coronal soft tissue · coronal · 0.26mm/px · 3 of 61 slices shown]
[im 22/61  brain]
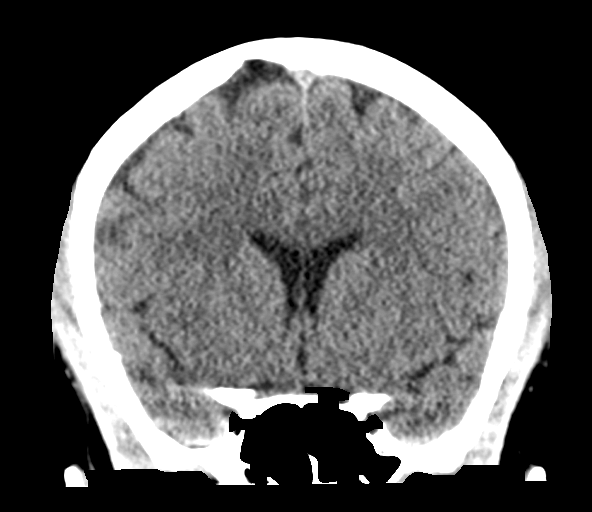
[im 28/61  brain]
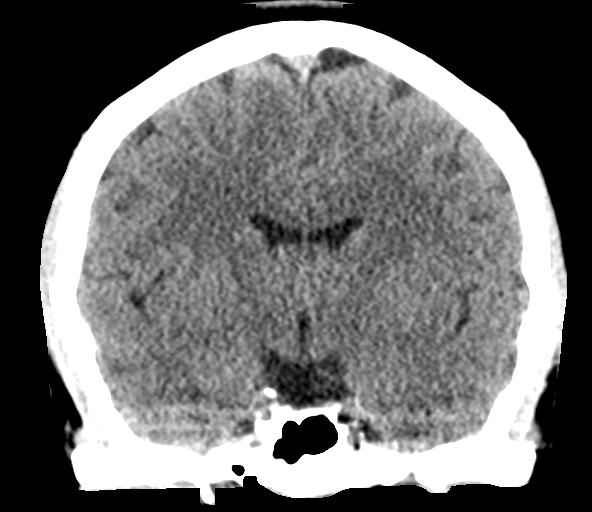
[im 33/61  brain]
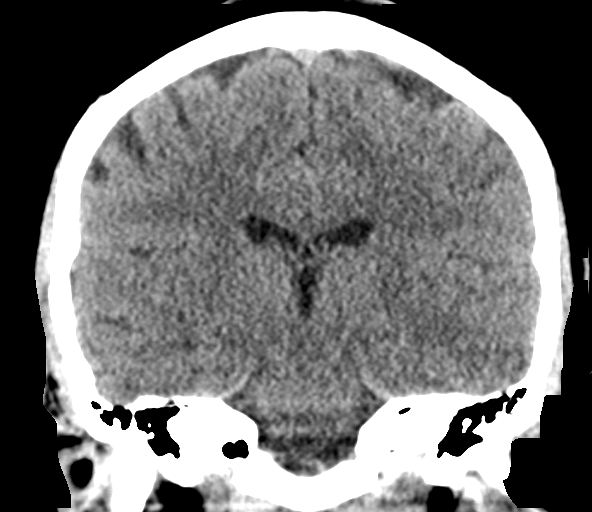

[Series 5: sagittal soft tissue · sagittal · 0.31mm/px · 3 of 48 slices shown]
[im 16/48  brain]
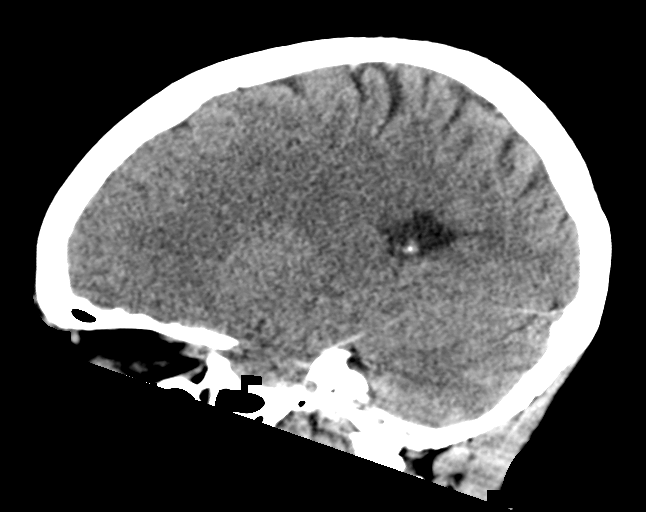
[im 24/48  brain]
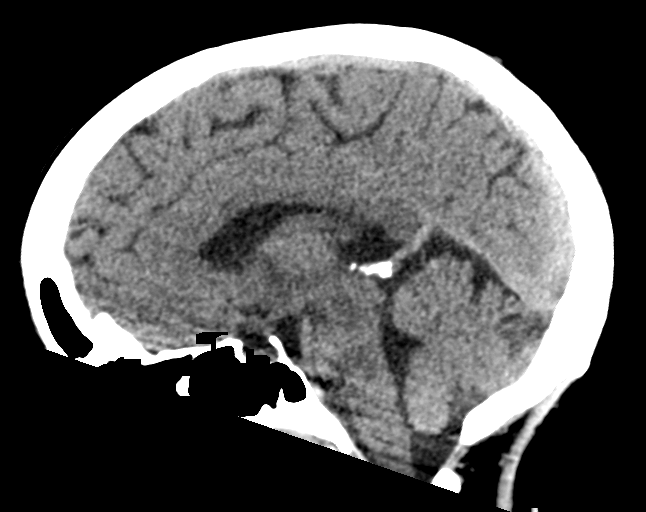
[im 32/48  brain]
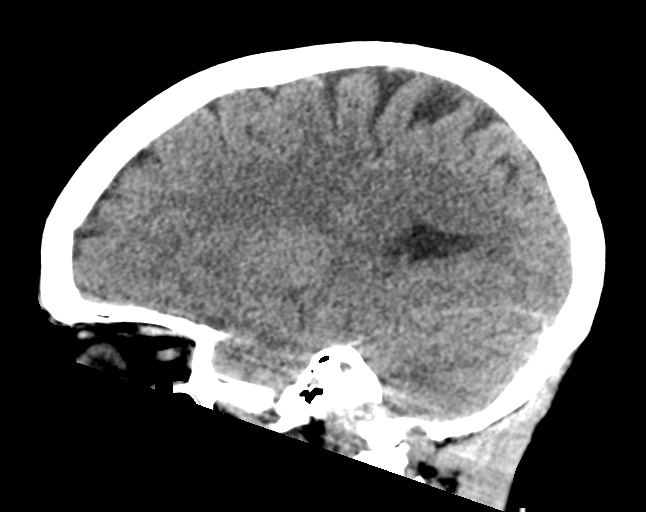

[15 of 47 positions shown; findings below may reference images not displayed]

FINDINGS: Brain: No acute intracranial abnormality. Specifically, no
hemorrhage, hydrocephalus, mass lesion, acute infarction, or
significant intracranial injury.

Vascular: No hyperdense vessel or unexpected calcification.

Skull: No acute calvarial abnormality.

Sinuses/Orbits: Visualized paranasal sinuses and mastoids clear.
Orbital soft tissues unremarkable.

Other: None
IMPRESSION: Normal study.

## 2020-06-04 NOTE — ED Triage Notes (Signed)
Pt states a shower rod fell on her head approx 1 hour pta. Pt complains of nausea, is unsure of loc. No obvious injury noted to skull.

## 2020-06-04 NOTE — ED Notes (Signed)
Pt reports she is going to wait in her car [phone]

## 2020-06-04 NOTE — ED Notes (Signed)
This RN spoke with pt on phone and she states that she is no longer at the hospital.  Pt states she left due to long wait.  Pt informed that she is free to return at any point for continued treatment.

## 2020-06-04 NOTE — ED Notes (Signed)
Do not see pt in lobby.

## 2020-06-04 NOTE — ED Notes (Signed)
Pt called on telephone to state she is waiting in her car.

## 2020-06-04 NOTE — ED Notes (Signed)
Pt approached front desk to ask RN the report from CT scan, Rn explained to pt that this has to be reviewed with the Emergency physician. Pt verbalizes she wants to leave, RN apologized for wait and encouraged pt to stay to go over report with Emergency Physician.

## 2020-06-08 ENCOUNTER — Emergency Department
Admission: EM | Admit: 2020-06-08 | Discharge: 2020-06-08 | Disposition: A | Discharge status: Home / Self Care | Payer: Federal, State, Local not specified - PPO | Attending: Emergency Medicine | Admitting: Emergency Medicine

## 2020-06-08 ENCOUNTER — Other Ambulatory Visit: Payer: Self-pay

## 2020-06-08 DIAGNOSIS — Z79899 Other long term (current) drug therapy: Secondary | ICD-10-CM | POA: Diagnosis not present

## 2020-06-08 DIAGNOSIS — F439 Reaction to severe stress, unspecified: Secondary | ICD-10-CM

## 2020-06-08 DIAGNOSIS — Z91040 Latex allergy status: Secondary | ICD-10-CM | POA: Insufficient documentation

## 2020-06-08 DIAGNOSIS — F329 Major depressive disorder, single episode, unspecified: Secondary | ICD-10-CM | POA: Diagnosis present

## 2020-06-08 DIAGNOSIS — Z91010 Allergy to peanuts: Secondary | ICD-10-CM | POA: Insufficient documentation

## 2020-06-08 DIAGNOSIS — F32A Depression, unspecified: Secondary | ICD-10-CM

## 2020-06-08 LAB — POCT PREGNANCY, URINE: Preg Test, Ur: NEGATIVE

## 2020-06-08 LAB — COMPREHENSIVE METABOLIC PANEL
ALT: 8 U/L (ref 0–44)
AST: 14 U/L — ABNORMAL LOW (ref 15–41)
Albumin: 4.3 g/dL (ref 3.5–5.0)
Alkaline Phosphatase: 53 U/L (ref 38–126)
Anion gap: 6 (ref 5–15)
BUN: 6 mg/dL (ref 6–20)
CO2: 29 mmol/L (ref 22–32)
Calcium: 9.1 mg/dL (ref 8.9–10.3)
Chloride: 104 mmol/L (ref 98–111)
Creatinine, Ser: 0.72 mg/dL (ref 0.44–1.00)
GFR calc Af Amer: >60 mL/min (ref >60)
GFR calc non Af Amer: >60 mL/min (ref >60)
Glucose, Bld: 99 mg/dL (ref 70–99)
Potassium: 3.7 mmol/L (ref 3.5–5.1)
Sodium: 139 mmol/L (ref 135–145)
Total Bilirubin: 0.5 mg/dL (ref 0.3–1.2)
Total Protein: 7.5 g/dL (ref 6.5–8.1)

## 2020-06-08 LAB — CBC
HCT: 38.6 % (ref 36.0–46.0)
Hemoglobin: 12.9 g/dL (ref 12.0–15.0)
MCH: 26.2 pg (ref 26.0–34.0)
MCHC: 33.4 g/dL (ref 30.0–36.0)
MCV: 78.3 fL — ABNORMAL LOW (ref 80.0–100.0)
Platelets: 238 10*3/uL (ref 150–400)
RBC: 4.93 MIL/uL (ref 3.87–5.11)
RDW: 14.1 % (ref 11.5–15.5)
WBC: 5.2 10*3/uL (ref 4.0–10.5)
nRBC: 0 % (ref 0.0–0.2)

## 2020-06-08 LAB — URINE DRUG SCREEN, QUALITATIVE (ARMC ONLY)
Amphetamines, Ur Screen: NOT DETECTED
Barbiturates, Ur Screen: NOT DETECTED
Benzodiazepine, Ur Scrn: NOT DETECTED
Cannabinoid 50 Ng, Ur ~~LOC~~: NOT DETECTED
Cocaine Metabolite,Ur ~~LOC~~: NOT DETECTED
MDMA (Ecstasy)Ur Screen: NOT DETECTED
Methadone Scn, Ur: NOT DETECTED
Opiate, Ur Screen: NOT DETECTED
Phencyclidine (PCP) Ur S: NOT DETECTED
Tricyclic, Ur Screen: NOT DETECTED

## 2020-06-08 LAB — SALICYLATE LEVEL: Salicylate Lvl: <7 mg/dL — ABNORMAL LOW (ref 7.0–30.0)

## 2020-06-08 LAB — ACETAMINOPHEN LEVEL: Acetaminophen (Tylenol), Serum: <10 ug/mL — ABNORMAL LOW (ref 10–30)

## 2020-06-08 LAB — ETHANOL: Alcohol, Ethyl (B): <10 mg/dL (ref <10)

## 2020-06-08 NOTE — ED Notes (Signed)
Dr Derrill Kay in triage to assess pt

## 2020-06-08 NOTE — ED Notes (Signed)
Pt belongings returned to patient.  

## 2020-06-08 NOTE — ED Provider Notes (Signed)
[facility] Emergency Department Provider Note  ____________________________________________   I have reviewed the triage vital signs and the nursing notes.   HISTORY  Chief Complaint Psychiatric Evaluation   History limited by: Not Limited   HPI Jennifer Byrd is a 24 y.o. female who presents to the emergency department today from [facility] because of concerns for SI and thoughts of self-harm.  The patient apparently was told by [facility] department that she come voluntarily where they can take out paperwork.  Patient states that she has not been able to find a counselor since moving to [place] from [place] earlier this summer.  Patient states that a lot of her thoughts of self harm revolve around her relationship with her father.  She states that she did try to harm her self many years ago.  She denies any unusual ingestions or self-injurious behavior recently.  Records reviewed. Per medical record review patient has a history of anxiety, heart palpitations, depression.  Past Medical History:  Diagnosis Date  . Acid reflux   . Allergy   . Anemia   . Anxiety   . Heart palpitations   . Strep throat     Patient Active Problem List   Diagnosis Date Noted  . Current moderate episode of major depressive disorder (HCC) 06/02/2020  . Irritable bowel syndrome 04/26/2020  . Dysthymia 04/26/2020  . Post-concussion headache 01/05/2020  . Anxiety about health 12/31/2019  . Headache disorder 12/28/2019  . Neck pain 12/28/2019  . Post concussion syndrome 12/24/2019  . Dyspnea 10/05/2016  . Allergic rhinitis due to house dust mite 01/23/2016  . Vitamin D deficiency 10/04/2015  . Hemorrhoidal skin tag 08/23/2015  . GERD without esophagitis 08/23/2015  . Chronic constipation 08/01/2015  . 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HCC) 07/22/2015  . Allergic rhinitis with postnasal drip 07/22/2015  . History of Clostridium difficile colitis 07/18/2015  . Iron deficiency  anemia 07/06/2015  . Palpitations 06/26/2015    Past Surgical History:  Procedure Laterality Date  . wisdon teeth      Prior to Admission medications   Medication Sig Start Date End Date Taking? Authorizing Provider  Cetirizine HCl 10 MG TBDP Take by mouth.    [provider]  fluticasone (FLONASE SENSIMIST) 27.5 MCG/SPRAY nasal spray Place into the nose. 03/15/20   [provider]  omeprazole (PRILOSEC) 40 MG capsule omeprazole 40 mg capsule,delayed release  TAKE 1 CAPSULE BY MOUTH EVERY DAY 02/03/20   [provider]    Allergies Penicillins, Azithromycin, Cefdinir, Peanut-containing drug products, Doxycycline, Latex, Meclizine, Amoxicillin, and Amoxicillin-pot clavulanate  Family History  Problem Relation Age of Onset  . Diabetes Mother   . Hypothyroidism Mother   . Diabetes Father   . Hypertension Father   . Hyperlipidemia Father   . Hypothyroidism Sister     Social History Social History   Tobacco Use  . Smoking status: Never Smoker  . Smokeless tobacco: Never Used  Vaping Use  . Vaping Use: Never used  Substance Use Topics  . Alcohol use: No  . Drug use: No    Review of Systems Constitutional: No fever/chills Eyes: No visual changes. ENT: No sore throat. Cardiovascular: Denies chest pain. Respiratory: Denies shortness of breath. Gastrointestinal: No abdominal pain.  No nausea, no vomiting.  No diarrhea.   Genitourinary: Negative for dysuria. Musculoskeletal: Negative for back pain. Skin: Negative for rash. Neurological: Negative for headaches, focal weakness or numbness.  ____________________________________________   PHYSICAL EXAM:  VITAL SIGNS: ED Triage Vitals  Enc Vitals Group     BP 06/08/20 2045 122/72     Pulse Rate 06/08/20 2045 60     Resp 06/08/20 2045 16     Temp 06/08/20 2045 99.6 F (37.6 C)     Temp Source 06/08/20 2045 Oral     SpO2 06/08/20 2045 100 %     Weight 06/08/20 2046 124 lb (56.2 kg)      Height 06/08/20 2046 5\' 6"  (1.676 m)     Head Circumference --      Peak Flow --      Pain Score 06/08/20 2052 7   Constitutional: Alert and oriented. Tearful. Eyes: Conjunctivae are normal.  ENT      Head: Normocephalic and atraumatic.      Nose: No congestion/rhinnorhea.      Mouth/Throat: Mucous membranes are moist.      Neck: No stridor. Respiratory: Normal respiratory effort without tachypnea nor retractions.  Musculoskeletal: Normal range of motion in all extremities.  Neurologic:  Normal speech and language. No gross focal neurologic deficits are appreciated.  Skin:  Skin is warm, dry and intact. No rash noted. Psychiatric: Tearful. Depressed.  ____________________________________________    LABS (pertinent positives/negatives)  Ethanol, acetaminophen, salicylate below threshold CMP wnl except ast 14 CBC wbc 5.2, hgb 12.9, plt 238 UDS negative ____________________________________________   EKG  None  ____________________________________________    RADIOLOGY  None  ____________________________________________   PROCEDURES  Procedures  ____________________________________________   INITIAL IMPRESSION / ASSESSMENT AND PLAN / ED COURSE  Pertinent labs & imaging results that were available during my care of the patient were reviewed by me and considered in my medical decision making (see chart for details).   Patient presented to the emergency department today from [place] because of concerns for depression and thoughts of self-harm.  On exam patient is tearful.  She did state to me however that she currently has no thoughts of self-harm.  Patient was seen by psychiatry who felt patient was safe to be discharged home.  ____________________________________________   FINAL CLINICAL IMPRESSION(S) / ED DIAGNOSES  Final diagnoses:  Depression, unspecified depression type  Stress     Note: This dictation was prepared with Dragon dictation. Any  transcriptional errors that result from this process are unintentional     Nance Pear, MD 06/08/20 2334

## 2020-06-08 NOTE — ED Notes (Addendum)
Pt admits to this nurse thoughts of hurting herself; st hx of attempt several years ago ; denies current SI; pt is here voluntary at this time

## 2020-06-08 NOTE — Discharge Instructions (Addendum)
Please seek medical attention and help for any thoughts about wanting to harm yourself, harm others, any concerning change in behavior, severe depression, inappropriate drug use or any other new or concerning symptoms. ° °

## 2020-06-08 NOTE — ED Triage Notes (Signed)
PT to ED stating she needs to be seen by psychiatrist and that sheriff's told her if she wasn't seen they would take out IVC paperwork. PT will not share any information with this RN.

## 2020-06-08 NOTE — ED Notes (Signed)
Pt dressed in burgundy scrubs by this tech. Pt items placed in belonging bag are the following: Black sandals Tan hat Teal shorts Tan underwear Radio broadcast assistant phone Black purse

## 2020-06-10 ENCOUNTER — Ambulatory Visit: Payer: Self-pay

## 2020-06-10 NOTE — Telephone Encounter (Signed)
Returned call to patient who states that she has been nauseated and not eating a lot for about 1 week.  She states that she has not vomited. She says that she still has a strange feeling at the back of her throat that she has been taking Omeprazole for acid reflux. She states that she is having sinus issues. She has had a recent head CT stating that she had bumped her head She was saw result in My Chart which she states was normal. NT verified in chart. She has been losing weight. She has Hx of IBS. She has been constipated. Patient denies other symptoms. Per protocol patient will go to UC for evaluation. Office appointments are not available. Care advice read to patient.  She verbalized understanding.  She was urged to call early next week for follow up visit for her issue.   Reason for Disposition . Nausea lasts > 1 week  Answer Assessment - Initial Assessment Questions 1. NAUSEA SEVERITY: "How bad is the nausea?" (e.g., mild, moderate, severe; dehydration, weight loss)   - MILD: loss of appetite without change in eating habits   - MODERATE: decreased oral intake without significant weight loss, dehydration, or malnutrition   - SEVERE: inadequate caloric or fluid intake, significant weight loss, symptoms of dehydration     Losing weight 2. ONSET: "When did the nausea begin?"    1 week nauseated 3. VOMITING: "Any vomiting?" If so, ask: "How many times today?"    No 4. RECURRENT SYMPTOM: "Have you had nausea before?" If so, ask: "When was the last time?" "What happened that time?"    never 5. CAUSE: "What do you think is causing the nausea?"     Funny feeling back of throat 6. PREGNANCY: "Is there any chance you are pregnant?" (e.g., unprotected intercourse, missed birth control pill, broken condom) birth control pregnancy test negative at hospital  Protocols used: [facility]

## 2020-06-13 ENCOUNTER — Telehealth: Payer: Self-pay

## 2020-06-13 NOTE — Telephone Encounter (Signed)
Copied from CRM [id number]. Topic: General - Other >> Jun 13, 2020  8:37 AM Jaquita Rector A wrote: Reason for CRM: Patient called to speak to the nurse to give update on Urgent Care visit. Can be reached at  Ph# [phone]   Seen at Urgent Care and dx with a concussion. She was treated and released. She had CT scan done results are in. Please advise.

## 2020-06-16 ENCOUNTER — Telehealth: Payer: Self-pay | Admitting: Family Medicine

## 2020-06-16 NOTE — Telephone Encounter (Signed)
Pt wants a FU re: considering a job and employer would want a 2nd chicken pox vaccine unless she gets tested for antibodies and can prove that she would not need. PT want Dr Kathie Rhodes nurse to touch base with her if this would be something that Dr Kathie Rhodes would order for her without being seen. FU at [phone]

## 2020-06-17 ENCOUNTER — Other Ambulatory Visit: Payer: Self-pay | Admitting: Family Medicine

## 2020-06-21 ENCOUNTER — Telehealth: Payer: Self-pay

## 2020-06-21 NOTE — Telephone Encounter (Signed)
Copied from CRM [id number]. Topic: General - Other >> Jun 20, 2020  3:27 PM Laural Benes, [place] C wrote: Reason for CRM: pt called in stating that she is returning Deng Kemler's call.

## 2020-06-22 ENCOUNTER — Encounter: Payer: Self-pay | Admitting: Family Medicine

## 2020-06-30 ENCOUNTER — Encounter: Payer: Self-pay | Admitting: Speech Pathology

## 2020-07-03 ENCOUNTER — Emergency Department
Admission: EM | Admit: 2020-07-03 | Discharge: 2020-07-03 | Disposition: A | Discharge status: Home / Self Care | Payer: Federal, State, Local not specified - PPO | Attending: Emergency Medicine | Admitting: Emergency Medicine

## 2020-07-03 ENCOUNTER — Other Ambulatory Visit: Payer: Self-pay

## 2020-07-03 ENCOUNTER — Encounter: Payer: Self-pay | Admitting: Emergency Medicine

## 2020-07-03 DIAGNOSIS — R070 Pain in throat: Secondary | ICD-10-CM | POA: Diagnosis present

## 2020-07-03 DIAGNOSIS — Z711 Person with feared health complaint in whom no diagnosis is made: Secondary | ICD-10-CM

## 2020-07-03 DIAGNOSIS — Z91040 Latex allergy status: Secondary | ICD-10-CM | POA: Insufficient documentation

## 2020-07-03 MED ORDER — DIPHENHYDRAMINE HCL 25 MG PO CAPS: 25.0000 mg | ORAL_CAPSULE | Freq: Once | ORAL | Status: DC

## 2020-07-03 MED ORDER — HYDROXYZINE HCL 25 MG PO TABS
25.0000 mg | ORAL_TABLET | Freq: Once | ORAL | Status: DC
  Filled 2020-07-03: qty 1

## 2020-07-03 NOTE — ED Notes (Signed)
Pt chose to leave AMA. Pt signed the signature pad for AMA discharge agreement.

## 2020-07-03 NOTE — ED Notes (Signed)
Pt given Patient Advocate Number by Registration after pt asked to speak with a Mining engineer".

## 2020-07-03 NOTE — ED Notes (Signed)
This RN attempted to administer medication. Pt stated that she did not know why she was getting medication. This RN explained that the EDP wanted to try to alleviate her symptoms. Pt continued to question why she was receiving mediation and this RN attempted to explain but patient stated she did not understand why it was necessary and refused the medication. This RN also explained that the EDP ordered a strep swap to be collect. The patient questioned the color of the swab and why it was ordered. This RN explained that the swab was specific to the type of test. Upon opening the swab the Pt asked why it had "liquid" on it. This RN explained that the swab was dry and that it had come directly from the package and once she was swabbed it would go into the container that contained liquid. This RN attempted to swab pt without success due to not being able to reach the back of the Pt's throat and only touching the center of the pt's tongue once. The pt then stated that the swab had a "funny" taste and that she had never had one taste like that.

## 2020-07-03 NOTE — ED Provider Notes (Signed)
[facility] Emergency Department Provider Note   ____________________________________________   First MD Initiated Contact with Patient 07/03/20 [phone]     (approximate)  I have reviewed the triage vital signs and the nursing notes.   HISTORY  Chief Complaint Sore Throat    HPI Jennifer Byrd is a 24 y.o. female presents to the ED with complaint of sore throat and also itching in her throat.  Patient states that she ate a late dinner of chicken, brown rice and broccoli.  She states she used [employer] on the broccoli which she has not used in the past.  She states that later she felt itching around her neck.  She reports taking Benadryl 12.5 mg at midnight has continued to have irritation and itching in her throat since.  Patient is able to swallow without any difficulty and is talking in complete sentences.  Patient states she has a history of allergies and also takes Zyrtec and Flonase for allergies.  She takes omeprazole for reflux.  She also reports seeing "white spots" on the left side of her throat.  She denies any fever, chills, nausea or vomiting.  There has been no change in taste or smell.  No vomiting or diarrhea and no Covid symptoms.       Past Medical History:  Diagnosis Date  . Acid reflux   . Allergy   . Anemia   . Anxiety   . Heart palpitations   . Strep throat     Patient Active Problem List   Diagnosis Date Noted  . Current moderate episode of major depressive disorder (HCC) 06/02/2020  . Irritable bowel syndrome 04/26/2020  . Dysthymia 04/26/2020  . Post-concussion headache 01/05/2020  . Anxiety about health 12/31/2019  . Headache disorder 12/28/2019  . Neck pain 12/28/2019  . Post concussion syndrome 12/24/2019  . Dyspnea 10/05/2016  . Allergic rhinitis due to house dust mite 01/23/2016  . Vitamin D deficiency 10/04/2015  . Hemorrhoidal skin tag 08/23/2015  . GERD without esophagitis 08/23/2015  . Chronic  constipation 08/01/2015  . 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HCC) 07/22/2015  . Allergic rhinitis with postnasal drip 07/22/2015  . History of Clostridium difficile colitis 07/18/2015  . Iron deficiency anemia 07/06/2015  . Palpitations 06/26/2015    Past Surgical History:  Procedure Laterality Date  . wisdon teeth      Prior to Admission medications   Medication Sig Start Date End Date Taking? Authorizing Provider  Cetirizine HCl 10 MG TBDP Take by mouth.    [provider]  fluticasone (FLONASE SENSIMIST) 27.5 MCG/SPRAY nasal spray Place into the nose. 03/15/20   [provider]  omeprazole (PRILOSEC) 40 MG capsule omeprazole 40 mg capsule,delayed release  TAKE 1 CAPSULE BY MOUTH EVERY DAY 02/03/20   [provider]    Allergies Cefdinir, Penicillins, Azithromycin, Doxycycline, Peanut-containing drug products, Latex, Meclizine, Amoxicillin, and Amoxicillin-pot clavulanate  Family History  Problem Relation Age of Onset  . Diabetes Mother   . Hypothyroidism Mother   . Diabetes Father   . Hypertension Father   . Hyperlipidemia Father   . Hypothyroidism Sister     Social History Social History   Tobacco Use  . Smoking status: Never Smoker  . Smokeless tobacco: Never Used  Vaping Use  . Vaping Use: Never used  Substance Use Topics  . Alcohol use: No  . Drug use: No    Review of Systems Constitutional: No fever/chills Eyes: No visual changes. ENT: Positive for  sore throat, itchy. Cardiovascular: Denies chest pain. Respiratory: Denies shortness of breath.  Negative for cough.  Negative for present difficulty breathing.  Negative for wheezing. Gastrointestinal: No abdominal pain.  No nausea, no vomiting.  Musculoskeletal: Negative for muscle aches. Skin: Negative for skin rash Neurological: Negative for headaches, focal weakness or numbness.  ____________________________________________   PHYSICAL EXAM:  VITAL SIGNS: ED Triage Vitals  Enc  Vitals Group     BP 07/03/20 0542 110/80     Pulse Rate 07/03/20 0542 75     Resp 07/03/20 0542 18     Temp 07/03/20 0542 98 F (36.7 C)     Temp Source 07/03/20 0542 Oral     SpO2 07/03/20 0542 100 %     Weight 07/03/20 0544 123 lb (55.8 kg)     Height 07/03/20 0544 5\' 6"  (1.676 m)     Head Circumference --      Peak Flow --      Pain Score 07/03/20 0602 7     Pain Loc --      Pain Edu? --      Excl. in GC? --     Constitutional: Alert and oriented. Well appearing and in no acute distress.  Patient is able to speak in complete sentences without any difficulty.  No audible wheezing is noted.  Patient appears to be quite anxious. Eyes: Conjunctivae are normal. PERRL. EOMI. Head: Atraumatic. Nose: No congestion/rhinnorhea. Mouth/Throat: Mucous membranes are moist.  Oropharynx non-erythematous.  Uvula is midline.  No edema present. Neck: No stridor.   Hematological/Lymphatic/Immunilogical: No cervical lymphadenopathy. Cardiovascular: Normal rate, regular rhythm. Grossly normal heart sounds.  Good peripheral circulation. Respiratory: Normal respiratory effort.  No retractions. Lungs CTAB. Musculoskeletal: Moves upper and lower extremities with any difficulty normal gait was noted. Neurologic:  Normal speech and language. No gross focal neurologic deficits are appreciated. No gait instability. Skin:  Skin is warm, dry and intact.  No rash is noted on examination of the neck and upper chest anteriorly. Psychiatric: Mood and affect are normal. Speech and behavior are normal.  ____________________________________________   LABS (all labs ordered are listed, but only abnormal results are displayed)  Labs Reviewed - No data to display ____________________________________________   PROCEDURES  Procedure(s) performed (including Critical Care):  Procedures   ____________________________________________   INITIAL IMPRESSION / ASSESSMENT AND PLAN / ED COURSE  As part of my  medical decision making, I reviewed the following data within the electronic MEDICAL RECORD NUMBER Notes from prior ED visits and [place] Controlled Substance Database  24 year old female presents to the ED with complaint of throat itching after eating chicken, broccoli and brown rice at approximately midnight last night.  Patient states that she took Benadryl 12.5 mg but this morning continues to have difficulty.  Physical exam was benign.  A tablet of Atarax was ordered for her itching since her father drove her and a strep test as she complains of sore throat and "white patches".  Patient refused to have the strep test done and also the medication.  Patient states that she was afraid that there was something already on the swab that was being used for her strep test.  RN showed her that all of the swabs for the strep test were the same.  Patient still refused both the swab and the medication.  Patient left AMA as she was refusing treatment and medication.  Vital signs were stable and patient was having no respiratory issues, wheezing and O2 sat was 100%.  ____________________________________________   FINAL CLINICAL IMPRESSION(S) / ED DIAGNOSES  Final diagnoses:  Person with feared complaint in whom no diagnosis is made     ED Discharge Orders    None       Note:  This document was prepared using Dragon voice recognition software and may include unintentional dictation errors.    Tommi Rumps, PA-C 07/03/20 1230    Arnaldo Natal, MD 07/03/20 1439

## 2020-07-03 NOTE — ED Notes (Signed)
Pt requested to see another strep swab. This RN showed the patient as sealed swab with container and explained how the swab was used. The pt then stated that she just wanted to leave. This RN asked if the patient certain that she did not want medication or the strep test and the pt said "no". The pt then signed the signature pad for the AMA agreement.

## 2020-07-03 NOTE — ED Triage Notes (Signed)
Pt arrived via POV with c/o eating a late dinner but has not tried any new foods, pt states she felt weird after eating. Pt c/o itching around neck, pt states she initially felt like she still had something in her throat.   Pt states she took benadryl around 12am. States she took 18mL of benadryl.   Pt reports some irritation in throat at this time. Pt able to swallow without difficulty.  Pt also states father is upset that she came here and talks about it frequently during triage.

## 2020-07-11 ENCOUNTER — Telehealth: Payer: Self-pay | Admitting: Family Medicine

## 2020-07-11 ENCOUNTER — Encounter: Payer: Self-pay | Admitting: Family Medicine

## 2020-07-11 NOTE — Telephone Encounter (Signed)
Patient is calling to see if she can be seen for a hospital follow sooner. Hospital follow up appt is the end of September 2021. Patient was seen at [facility] in [place]. She was seen for Chest Wall pain muscular hurting with deep breathes. An EKG was completed. And patient has some concerns about the EKG results.  Chest pain for last 3 days. Patient reports no chest pain now. Cb- [phone]

## 2020-07-12 NOTE — Telephone Encounter (Signed)
Pt returned call requesting to speak to [facility], understands 2:00 pm appt on 07/14/2020 however states that [facility] told her it is at 11 am. Please advise  Best contact: [phone]

## 2020-07-12 NOTE — Telephone Encounter (Signed)
LVM to confirm patient's appointment with Dr. Dorris Fetch on 07/14/20.

## 2020-07-13 NOTE — Progress Notes (Signed)
Patient ID: Jennifer Byrd, female    DOB: Jun 27, 1996, 24 y.o.   MRN: [id number]  PCP: Alba Cory, MD  Chief Complaint  Patient presents with  . Palpitations    Urgent care 07/10/2020, would like to discuss ECG done at urgent care    Subjective:   Jennifer Byrd is a 24 y.o. female, presents to clinic with CC of the following:  Chief Complaint  Patient presents with  . Palpitations    Urgent care 07/10/2020, would like to discuss ECG done at urgent care    HPI:  Patient is a 24 year old female patient of Dr. Carlynn Purl Follows up today with  She was seen in urgent care on July 10, 2020 with the following complaints:  24yo female presents with 2 days of nasal congestion and chest pain when taking a deep breath or moving around. Denies accident/injury or increased activity to cause symptoms. Denies history of cardiac or pulmonary concern, but also reports history of similar problem though less severe. Patient tried Zyrtec and Flonase with relief of congestion and Tylenol with mild relief of chest pain. Chest pain described as pressure and aching, and worse with movement or pressing on the chest. Associated with palpitations last night and earlier today, though resolved at time of visit. Denies fever, chills, headache, weakness, abdominal pain, n/v/d, and parestesias. The assessment/plan was as follows:  Chest pain most likely musculoskeletal in nature. No abnormal heart sounds, fever, constitutional symptoms, or positional aspects to suggest pericarditis. No exertional dyspnea, JVD, murmurs, or pedal edema that would suggest heart failure. No pleuritic chest pain or abrupt onset of dyspnea that would suggest pneumothorax. Patient is not tachycardic nor tachypneic. PERC score is 0 with no lower extremity signs of DVT, no recent travel history, immobilization, or trauma, and no hormone replacement; therefore, I do not suspect pulmonary embolism. Patient respectfully declined  Chest Xray imaging today. Consulted with Dr Earlean Polka and reviewed EKG findings. In agreement for patient disposition to home with strict ER precautions. Her Covid test from that visit was negative  On June 24, 2020, she was seen in [facility] for chest pain symptoms.  The following note was reviewed: Jennifer Byrd is a 24 y.o. female here for sternal and chest wall pain. She denies any specific injury. She denies any lifting of heavy objects. She denies any shortness of breath. She does describe pain with movement, and deep inspiration. She denies any numbness, tingling, weakness of any extremity. Of note, per chart review, patient has been seen multiple times for this in the past. She has been evaluated by cardiology, per patient. She has had an echocardiogram, and a stress test, which were reportedly negative. She also has a history of reflux, and has been evaluated by GI, with a negative EGD. She is currently taking omeprazole for symptoms. She reports that they recently increased her omeprazole from 20 mg daily to 40 mg daily. She denies any difficulty swallowing. She denies any change in diet. She denies any change in medications. Other than stated. She denies any fever, chills, abdominal pain, or urinary symptoms. She denies any change in bowel habits. Upon further interview, patient does report a history of "self-reported anxiety, and PTSD." She reports that this caused her to move back home from [place], to live with her parents. She has seen counseling in the past. She does not have a psychiatrist. She is not been medicated for any of these symptoms. She often worries about her heart. In  the past, she has had palpitations, which prompted the cardiology work-up. At the end of the interview, patient asks repeatedly, if this will happen again. Per chart review, this has happened in the past where she questioned recurrence of symptoms. We have a long discussion regarding the likelihood of this happening  again, and the severity, which is likely low. I reassured her that this is most likely not serious in nature. This is given her cardiology work-up, and GI work-up. Also, she has had multiple negative chest x-rays. She currently does not have a PCP in the area, secondary to moving back home. She is requesting counseling. Patient becomes tearful, with continued dialogue regarding her symptoms, and worry about her heart. Upon further interview, patient reports that her "PTSD" was secondary to her being admitted to persistent heart palpitations. This is causing her extreme anxiety over the possibility of recurrence of the symptoms. She asks what she should do if the palpitations recur, and or persist.  Patient instructions from that visit were as follows:  Patient Instructions  As we discussed, given your clinical history, physical examination, and previous evaluations by gastroenterology and cardiology, I do not think this is cardiac in nature. Therefore I think that your heart is doing well. Should you feel the symptoms in the future, for prolonged period of time, please seek evaluation in the emergency department. However, you may give it a couple of days sometimes, and try some deep breathing exercises. Some of this could be anxiety related due to your concern about cardiac pathology, and previous experience with prolonged heart palpitations. Please establish care with a counselor, and or primary care provider for further evaluation of your health.   I saw her on June 02, 2020 for persistent sore throat, also noted that visit the following:  Of note, in February she had an EGD and was told after to increase her omeprazole to 40 mg daily.  She also had a swallowing study done in May, for difficulty swallowing, although she notes she was never told what the results were.  Trying to look up the results on epic, it was stated result not yet final.  She has a history of anxiety and depression, and states she is  on the wait list for third weight counseling.  She is not taking any medications presently.  Her GAD-7 and PHQ-9 were reviewed.  She states that she was more anxious with some of the screening questions asked prior to the medical visit, and feels better after our visit the day.  She checked a 1 for suicidal thoughts, and she denied any thoughts of hurting self or hurting others recently, with no active plans.  She stated it was a fleeting thoughts of being better off dead immediately prior to this visit, and reassured me again she has no recent thoughts of hurting herself or others. Did recommend a follow-up with counseling at that visit to help.  She follows up today noting that she was seen this past Sunday at urgent care for chest pains, and there was something on the EKG that was abnormal, she stated was letters and was concerned about that.  Unfortunately, I could not access the EKG from that visit to review.  I asked Melissa for help, and she could not access it either.  The note did say it was reviewed with Dr. Earlean PolkaGodard was in agreement for patient disposition to home.  I do not have further details. She notes that she still has some intermittent chest pains,  central chest, sometimes right-sided, sometimes left-sided, with once more recently left-sided.  She complained of some earlier today, and some mild chest pain presently.  It is left of the breastbone, can be intermittently sharp, with no other concerning associated symptoms.  She does note she gets occasional palpitations or feeling like her heart is going faster, and that is very intermittent.  After our last visit, she did present to [facility] [place], and is seeing a trauma based counselor as there is concerned she has PTSD, and anxiety related to that.  She has only seen them once last week.  She states she still is on the wait list for the other counseling noted last visit.  She does not want to start medicines for anxiety, as she  states that makes her more anxious. She requested getting a Covid antibody test, as she had Covid in July 2020, and wants to know if she has antibodies from this.  I discussed at length we are not sure what to make of these antibody results, and strongly encourage people to get immunized whether there antibodies are positive or not, especially with the variance of Covid noted recently.  She notes she still would like to get the test done, and when I noted it may not be covered with insurance she still stated she wanted to get it done today.    Patient Active Problem List   Diagnosis Date Noted  . Current moderate episode of major depressive disorder (HCC) 06/02/2020  . Irritable bowel syndrome 04/26/2020  . Dysthymia 04/26/2020  . Post-concussion headache 01/05/2020  . Anxiety about health 12/31/2019  . Headache disorder 12/28/2019  . Neck pain 12/28/2019  . Post concussion syndrome 12/24/2019  . Dyspnea 10/05/2016  . Allergic rhinitis due to house dust mite 01/23/2016  . Vitamin D deficiency 10/04/2015  . Hemorrhoidal skin tag 08/23/2015  . GERD without esophagitis 08/23/2015  . Chronic constipation 08/01/2015  . 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HCC) 07/22/2015  . Allergic rhinitis with postnasal drip 07/22/2015  . History of Clostridium difficile colitis 07/18/2015  . Iron deficiency anemia 07/06/2015  . Palpitations 06/26/2015      Current Outpatient Medications:  .  Cetirizine HCl 10 MG TBDP, Take by mouth., Disp: , Rfl:  .  fluticasone (FLONASE SENSIMIST) 27.5 MCG/SPRAY nasal spray, Place into the nose., Disp: , Rfl:  .  omeprazole (PRILOSEC) 40 MG capsule, omeprazole 40 mg capsule,delayed release  TAKE 1 CAPSULE BY MOUTH EVERY DAY, Disp: , Rfl:    Allergies  Allergen Reactions  . Cefdinir Hives  . Penicillins Anaphylaxis and Rash  . Azithromycin Hives and Rash  . Doxycycline Other (See Comments)    Throat irritation, difficulty swallowing.  Throat irritation    .  Peanut-Containing Drug Products Hives  . Latex Hives  . Meclizine     Unknown reaction per pt  . Amoxicillin Hives, Rash and Other (See Comments)    Patient states it makes her neck break out. Patient states it makes her neck break out.  [name] Amoxicillin-Pot Clavulanate Rash     Past Surgical History:  Procedure Laterality Date  . wisdon teeth       Family History  Problem Relation Age of Onset  . Diabetes Mother   . Hypothyroidism Mother   . Diabetes Father   . Hypertension Father   . Hyperlipidemia Father   . Hypothyroidism Sister      Social History   Tobacco Use  . Smoking status: Never Smoker  .  Smokeless tobacco: Never Used  Substance Use Topics  . Alcohol use: No    With staff assistance, above reviewed with the patient today.  ROS: As per HPI, otherwise no specific complaints on a limited and focused system review   No results found for this or any previous visit (from the past 72 hour(s)).   PHQ2/9: Depression screen [facility] 2/9 07/14/2020 06/02/2020 04/26/2020 12/24/2019 05/27/2019  Decreased Interest 0 3 0 2 0  Down, Depressed, Hopeless 2 3 0 2 0  PHQ - 2 Score 2 6 0 4 0  Altered sleeping 2 3 0 3 3  Tired, decreased energy 2 3 0 3 0  Change in appetite 2 1 0 3 0  Feeling bad or failure about yourself  1 3 0 2 0  Trouble concentrating 1 1 0 3 0  Moving slowly or fidgety/restless 1 1 0 2 0  Suicidal thoughts 0 1 0 0 0  PHQ-9 Score 11 19 0 20 3  Difficult doing work/chores Somewhat difficult Very difficult Not difficult at all Very difficult Not difficult at all   PHQ-2/9 Result is reviewed  GAD 7 : Generalized Anxiety Score 07/14/2020 06/02/2020 12/24/2019  Nervous, Anxious, on Edge Control/stop worrying Worry too much - different things Trouble relaxing Restless Easily annoyed or irritable Afraid - awful might happen Total GAD 7 Score Anxiety Difficulty Extremely difficult Extremely difficult  Very difficult    Result reviewed  Fall Risk: Fall Risk  07/14/2020 06/02/2020 04/26/2020 12/24/2019 05/27/2019  Falls in the past year? 0 0 0 0 0  Number falls in past yr: 0 0 0 0 0  Injury with Fall? 0 0 0 0 0  Follow up - - - Falls evaluation completed -      Objective:   Vitals:   07/14/20 1354 07/14/20 1402  BP: 118/82   Pulse: (!) 110 100  Resp: 16   Temp: 98.1 F (36.7 C)   TempSrc: Temporal   SpO2: 100%   Weight: 123 lb 14.4 oz (56.2 kg)   Height:  (1.676 m)     Body mass index is 20 kg/m.  Physical Exam   NAD, masked HEENT - [place]/AT, sclera anicteric, PERRL, conj - non-inj'ed,  Neck - supple, carotids 2+ and = without bruits bilat Car - RRR without m/g/r Pulm- RR and effort normal at rest, CTA without wheeze or rales Chest-she was tender when I palpated the left anterior chest wall adjacent to the sternum in the mid to lower sternal region where she feels her chest pain.  Abd - soft, NT diffusely, ND, Back - no CVA tenderness Ext - no LE edema,  Neuro/psychiatric - affect was not flat, appropriate with conversation  Alert  Grossly non-focal   Speech normal   Results for orders placed or performed during the hospital encounter of 06/08/20  Comprehensive metabolic panel  Result Value Ref Range   Sodium 139 135 - 145 mmol/L   Potassium 3.7 3.5 - 5.1 mmol/L   Chloride 104 98 - 111 mmol/L   CO2 29 22 - 32 mmol/L   Glucose, Bld 99 70 - 99 mg/dL   BUN 6 6 - 20 mg/dL   Creatinine, Ser 0.98 0.44 - 1.00 mg/dL   Calcium 9.1 8.9 - 11.9 mg/dL   Total Protein 7.5  6.5 - 8.1 g/dL   Albumin 4.3 3.5 - 5.0 g/dL   AST 14 (L) 15 - 41 U/L   ALT 8 0 - 44 U/L   Alkaline Phosphatase 53 38 - 126 U/L   Total Bilirubin 0.5 0.3 - 1.2 mg/dL   GFR calc non Af Amer >60 >60 mL/min   GFR calc Af Amer >60 >60 mL/min   Anion gap 6 5 - 15  Ethanol  Result Value Ref Range   Alcohol, Ethyl (B) <10 <10 mg/dL  Salicylate level  Result Value Ref Range   Salicylate Lvl <7.0 (L) 7.0  - 30.0 mg/dL  Acetaminophen level  Result Value Ref Range   Acetaminophen (Tylenol), Serum <10 (L) 10 - 30 ug/mL  cbc  Result Value Ref Range   WBC 5.2 4.0 - 10.5 K/uL   RBC 4.93 3.87 - 5.11 MIL/uL   Hemoglobin 12.9 12.0 - 15.0 g/dL   HCT 19.1 36 - 46 %   MCV 78.3 (L) 80.0 - 100.0 fL   MCH 26.2 26.0 - 34.0 pg   MCHC 33.4 30.0 - 36.0 g/dL   RDW 47.8 29.5 - 62.1 %   Platelets 238 150 - 400 K/uL   nRBC 0.0 0.0 - 0.2 %  Urine Drug Screen, Qualitative  Result Value Ref Range   Tricyclic, Ur Screen NONE DETECTED NONE DETECTED   Amphetamines, Ur Screen NONE DETECTED NONE DETECTED   MDMA (Ecstasy)Ur Screen NONE DETECTED NONE DETECTED   Cocaine Metabolite,Ur [place] NONE DETECTED NONE DETECTED   Opiate, Ur Screen NONE DETECTED NONE DETECTED   Phencyclidine (PCP) Ur S NONE DETECTED NONE DETECTED   Cannabinoid 50 Ng, Ur [place] NONE DETECTED NONE DETECTED   Barbiturates, Ur Screen NONE DETECTED NONE DETECTED   Benzodiazepine, Ur Scrn NONE DETECTED NONE DETECTED   Methadone Scn, Ur NONE DETECTED NONE DETECTED  Pregnancy, urine POC  Result Value Ref Range   Preg Test, Ur NEGATIVE NEGATIVE       Assessment & Plan:   1. History of COVID-19 She wanted to have antibodies checked today, and as noted above, did not think they would be helpful.  Despite providing her information on this, she wanted to have them done, even if they were not covered by her insurance, and I noted it was okay to have them done and ordered. - SARS CoV2 Serology(COVID19) AB(IgG,IgM),Immunoassay  2. Generalized anxiety disorder Discussed with her at length today the concerns for anxiety contributing to her symptoms, perhaps quickly making a 2 out of 10 pain much higher on the pain scale.  Do feel medicine would be helpful, although she really did not want to add a medicine today to help.  Like last visit, emphasized the importance of counseling's involvement, and she does have somebody involved in [place] now, and still on the  wait list for when I was contacted previously. Did review her GAD-7 today which was positive.  3. Anxiety with depression Also has depressive symptoms at times, and counseling can be helpful in this regard as well.  Ideally, would like to add a medicine to help although she does not want to add that presently.  4. Other chest pain/? ECG interpretation prior/palpitations I noted to her I do not have that EKG to review, and discussed just getting another EKG in the office today to assess, although did note not sure how that will be covered with insurance as one concern. She states she has the EKG through my chart, and can send it  to me to look at, and I noted I would be happy to look at that if she would do so.  If I have any concerns I will get in touch with her. I noted I am very confident with her previous work-ups including the one at urgent care recently but it is very unlikely that her chest pain has a cardiac source, and she has seen cardiology in the past as noted above.  She noted that was a year ago and things could have possibly changed over this time.  I assured her that was unlikely, and feel confident that she does have a chest wall component to her symptoms, likely costochondritis or costochondral syndrome, and explained that to her today. Can try an ibuprofen product with food when she develops the pains and see if that helps more than the Tylenol she is taking, as she noted she has been taking a lot of Tylenol to help.   She had a Covid antibody test done today, and await that result, and reassured that the intermittent chest pain she has is not likely a cardiac source, and do feel continued counseling involvement is important over time.      Jamelle Haring, MD 07/14/20 2:23 PM

## 2020-07-14 ENCOUNTER — Ambulatory Visit (INDEPENDENT_AMBULATORY_CARE_PROVIDER_SITE_OTHER): Payer: Federal, State, Local not specified - PPO | Admitting: Internal Medicine

## 2020-07-14 ENCOUNTER — Encounter: Payer: Self-pay | Admitting: Internal Medicine

## 2020-07-14 ENCOUNTER — Other Ambulatory Visit: Payer: Self-pay

## 2020-07-14 VITALS — BP 118/82 | HR 100 | Temp 98.1°F | Resp 16 | Ht 66.0 in | Wt 123.9 lb

## 2020-07-14 DIAGNOSIS — F411 Generalized anxiety disorder: Secondary | ICD-10-CM | POA: Diagnosis not present

## 2020-07-14 DIAGNOSIS — F418 Other specified anxiety disorders: Secondary | ICD-10-CM | POA: Diagnosis not present

## 2020-07-14 DIAGNOSIS — R0789 Other chest pain: Secondary | ICD-10-CM

## 2020-07-14 DIAGNOSIS — Z8616 Personal history of COVID-19: Secondary | ICD-10-CM | POA: Diagnosis not present

## 2020-07-14 DIAGNOSIS — R002 Palpitations: Secondary | ICD-10-CM

## 2020-07-15 LAB — SARS COV-2 SEROLOGY(COVID-19)AB(IGG,IGM),IMMUNOASSAY
SARS CoV-2 AB IgG: NEGATIVE
SARS CoV-2 IgM: NEGATIVE

## 2020-07-22 ENCOUNTER — Encounter: Payer: Self-pay | Admitting: Advanced Practice Midwife

## 2020-07-22 ENCOUNTER — Telehealth: Payer: Self-pay | Admitting: Family Medicine

## 2020-07-22 ENCOUNTER — Other Ambulatory Visit: Payer: Self-pay

## 2020-07-22 ENCOUNTER — Ambulatory Visit: Payer: Self-pay | Admitting: Advanced Practice Midwife

## 2020-07-22 DIAGNOSIS — F1210 Cannabis abuse, uncomplicated: Secondary | ICD-10-CM

## 2020-07-22 DIAGNOSIS — Z113 Encounter for screening for infections with a predominantly sexual mode of transmission: Secondary | ICD-10-CM

## 2020-07-22 DIAGNOSIS — T7411XS Adult physical abuse, confirmed, sequela: Secondary | ICD-10-CM

## 2020-07-22 DIAGNOSIS — T7421XA Adult sexual abuse, confirmed, initial encounter: Secondary | ICD-10-CM | POA: Insufficient documentation

## 2020-07-22 DIAGNOSIS — F4310 Post-traumatic stress disorder, unspecified: Secondary | ICD-10-CM

## 2020-07-22 DIAGNOSIS — T7421XS Adult sexual abuse, confirmed, sequela: Secondary | ICD-10-CM

## 2020-07-22 DIAGNOSIS — T7411XA Adult physical abuse, confirmed, initial encounter: Secondary | ICD-10-CM | POA: Insufficient documentation

## 2020-07-22 LAB — WET PREP FOR TRICH, YEAST, CLUE
Trichomonas Exam: NEGATIVE
Yeast Exam: NEGATIVE

## 2020-07-22 NOTE — Progress Notes (Signed)
Wet mount reviewed, no treatment indicated..Charlane Westry Brewer-Jensen, RN 

## 2020-07-22 NOTE — Telephone Encounter (Signed)
Has a question about testing for STD.

## 2020-07-22 NOTE — Telephone Encounter (Addendum)
Patient returned phone call asking "if I come and get tested for STD's can it be by urine?" RN informed patient that urine GC and Chlamydia STD testing is for female patients only and then that is only by certain criteria. Patient verbalized understanding of above and plans to keep scheduled STI appt this afternoon. Patient denies further questions or concerns regarding above. Tawny Hopping, RN

## 2020-07-22 NOTE — Progress Notes (Signed)
[facility] Department STI clinic/screening visit  Subjective:  Jennifer Byrd is a 24 y.o. SBF nullip nonsmoker female being seen today for an STI screening visit. The patient reports they do have symptoms.  Patient reports that they do not desire a pregnancy in the next year.   They reported they are not interested in discussing contraception today.  Patient's last menstrual period was 07/22/2020 (exact date).   Patient has the following medical conditions:   Patient Active Problem List   Diagnosis Date Noted  . PTSD (post-traumatic stress disorder) 2017 07/22/2020  . Marijuana use last 2017 07/22/2020  . Generalized anxiety disorder 07/14/2020  . Current moderate episode of major depressive disorder (HCC) 06/02/2020  . Irritable bowel syndrome 04/26/2020  . Dysthymia 04/26/2020  . Post-concussion headache 01/05/2020  . Anxiety about health 12/31/2019  . Headache disorder 12/28/2019  . Neck pain 12/28/2019  . Post concussion syndrome 12/24/2019  . Dyspnea 10/05/2016  . Allergic rhinitis due to house dust mite 01/23/2016  . Vitamin D deficiency 10/04/2015  . Hemorrhoidal skin tag 08/23/2015  . GERD without esophagitis 08/23/2015  . Chronic constipation 08/01/2015  . 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HCC) 07/22/2015  . Allergic rhinitis with postnasal drip 07/22/2015  . History of Clostridium difficile colitis 07/18/2015  . Iron deficiency anemia 07/06/2015  . Palpitations 06/26/2015    No chief complaint on file.   HPI  Patient reports last sex 04/23/20 with condom that ripped.  LMP not while on Mirena.  Mirena inserted 01/2018. Last pap 07/2018 neg.  Last ETOH 12/18/19 (1 shot Tequila) "never".  C/o malodor x 4 days  Last HIV test per patient/review of record was unknown Patient reports last pap was 07/2018 neg  See flowsheet for further details and programmatic requirements.    The following portions of the patient's history were reviewed and updated as  appropriate: allergies, current medications, past medical history, past social history, past surgical history and problem list.  Objective:  There were no vitals filed for this visit.  Physical Exam Vitals and nursing note reviewed.  Constitutional:      Appearance: Normal appearance.  HENT:     Head: Normocephalic and atraumatic.     Mouth/Throat:     Mouth: Mucous membranes are moist.     Pharynx: Oropharynx is clear. No oropharyngeal exudate or posterior oropharyngeal erythema.  Eyes:     Conjunctiva/sclera: Conjunctivae normal.  Pulmonary:     Effort: Pulmonary effort is normal.  Abdominal:     General: Abdomen is flat.     Palpations: Abdomen is soft. There is no mass.     Tenderness: There is no abdominal tenderness. There is no rebound.     Comments: Good tone, soft without tenderness  Genitourinary:    General: Normal vulva.     Exam position: Lithotomy position.     Pubic Area: No rash or pubic lice.      Labia:        Right: No rash or lesion.        Left: No rash or lesion.      Vagina: Vaginal discharge (brown spotting, ph>4.5) present. No erythema, bleeding or lesions.     Cervix: Normal.     Uterus: Normal.      Adnexa: Right adnexa normal and left adnexa normal.     Rectum: Normal.  Lymphadenopathy:     Head:     Right side of head: No preauricular or posterior auricular adenopathy.  Left side of head: No preauricular or posterior auricular adenopathy.     Cervical: No cervical adenopathy.     Upper Body:     Right upper body: No supraclavicular or axillary adenopathy.     Left upper body: No supraclavicular or axillary adenopathy.     Lower Body: No right inguinal adenopathy. No left inguinal adenopathy.  Skin:    General: Skin is warm and dry.     Findings: No rash.  Neurological:     Mental Status: She is alert and oriented to person, place, and time.      Assessment and Plan:  Jennifer Byrd is a 24 y.o. female presenting to the  [facility] Department for STI screening  1. Screening examination for venereal disease Treat wet mount per standing orders Immunization nurse consult - WET PREP FOR TRICH, YEAST, CLUE - Chlamydia/Gonorrhea [facility] - Syphilis Serology, [place] [facility] - HIV [facility]  2. PTSD (post-traumatic stress disorder)   3. Marijuana use last 2017      No follow-ups on file.  No future appointments.  Alberteen Spindle, CNM

## 2020-07-22 NOTE — Telephone Encounter (Signed)
Returned patient phone call to provided contact number. No answer, LMTC. Darcel Zick, RN  

## 2020-07-29 ENCOUNTER — Telehealth: Payer: Self-pay | Admitting: Family Medicine

## 2020-07-29 NOTE — Telephone Encounter (Signed)
Wants to speak to nurse °

## 2020-07-29 NOTE — Telephone Encounter (Signed)
Returned call to pt at phone # provided. Pt reports she would like to know her TR's from 07/22/2020. Verified I was speaking with pt with pt's name, DOB and password. Counseled pt regarding her negative HIV and RPR results, but counseled pt that we do not have the Gonorrhea and Chlamydia test results back yet. Pt aware that we will give her a call if any TR comes back as abnormal. Pt states understanding. Offered TR appt to pt if she would like copies of her TR's, but pt declines at this time. Pt with no further questions at this time.

## 2020-08-17 ENCOUNTER — Other Ambulatory Visit: Payer: Self-pay

## 2020-09-20 ENCOUNTER — Emergency Department: Payer: Federal, State, Local not specified - PPO

## 2020-09-20 ENCOUNTER — Encounter: Payer: Self-pay | Admitting: Emergency Medicine

## 2020-09-20 ENCOUNTER — Ambulatory Visit: Payer: Self-pay

## 2020-09-20 ENCOUNTER — Emergency Department
Admission: EM | Admit: 2020-09-20 | Discharge: 2020-09-20 | Disposition: A | Discharge status: Home / Self Care | Payer: Federal, State, Local not specified - PPO | Attending: Emergency Medicine | Admitting: Emergency Medicine

## 2020-09-20 ENCOUNTER — Other Ambulatory Visit: Payer: Self-pay

## 2020-09-20 DIAGNOSIS — R079 Chest pain, unspecified: Secondary | ICD-10-CM | POA: Diagnosis present

## 2020-09-20 DIAGNOSIS — K2100 Gastro-esophageal reflux disease with esophagitis, without bleeding: Secondary | ICD-10-CM | POA: Diagnosis not present

## 2020-09-20 DIAGNOSIS — Z91040 Latex allergy status: Secondary | ICD-10-CM | POA: Insufficient documentation

## 2020-09-20 LAB — CBC
HCT: 40.1 % (ref 36.0–46.0)
Hemoglobin: 13.4 g/dL (ref 12.0–15.0)
MCH: 26 pg (ref 26.0–34.0)
MCHC: 33.4 g/dL (ref 30.0–36.0)
MCV: 77.7 fL — ABNORMAL LOW (ref 80.0–100.0)
Platelets: 228 10*3/uL (ref 150–400)
RBC: 5.16 MIL/uL — ABNORMAL HIGH (ref 3.87–5.11)
RDW: 14.6 % (ref 11.5–15.5)
WBC: 3.9 10*3/uL — ABNORMAL LOW (ref 4.0–10.5)
nRBC: 0 % (ref 0.0–0.2)

## 2020-09-20 LAB — BASIC METABOLIC PANEL
Anion gap: 7 (ref 5–15)
BUN: 7 mg/dL (ref 6–20)
CO2: 28 mmol/L (ref 22–32)
Calcium: 9.3 mg/dL (ref 8.9–10.3)
Chloride: 103 mmol/L (ref 98–111)
Creatinine, Ser: 0.72 mg/dL (ref 0.44–1.00)
GFR calc Af Amer: >60 mL/min (ref >60)
GFR calc non Af Amer: >60 mL/min (ref >60)
Glucose, Bld: 94 mg/dL (ref 70–99)
Potassium: 4.3 mmol/L (ref 3.5–5.1)
Sodium: 138 mmol/L (ref 135–145)

## 2020-09-20 LAB — GLUCOSE, CAPILLARY: Glucose-Capillary: 70 mg/dL (ref 70–99)

## 2020-09-20 LAB — TROPONIN I (HIGH SENSITIVITY): Troponin I (High Sensitivity): <2 ng/L (ref <18)

## 2020-09-20 IMAGING — CR DG CHEST 2V
1 series · 2 of 2 positions shown · non-contrast
Comparison: [DATE]

CLINICAL DATA: chest pain

EXAM:
CHEST - 2 VIEW

[Series 1: dg chest 2 view · 0.14mm/px · 2 of 2 slices shown]
[im 1/2]
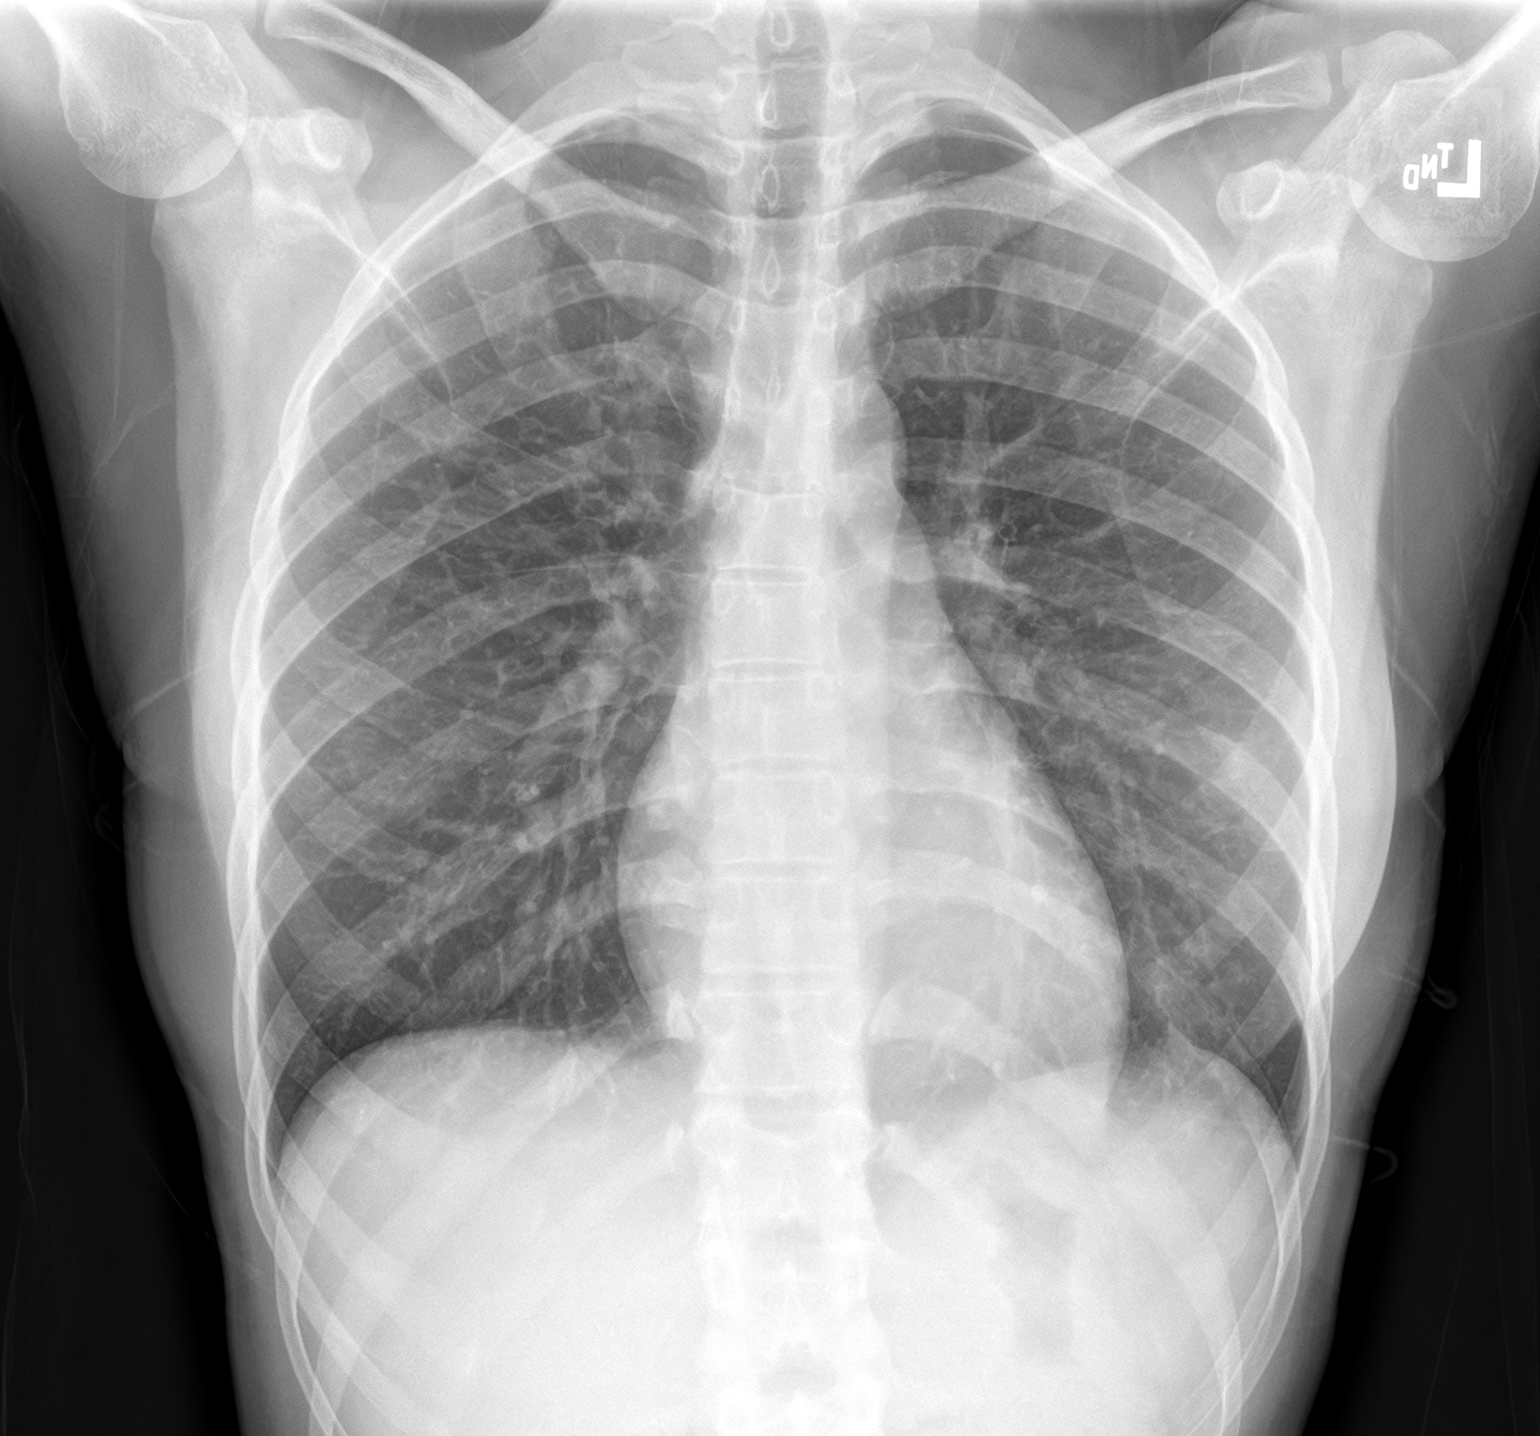
[im 2/2]
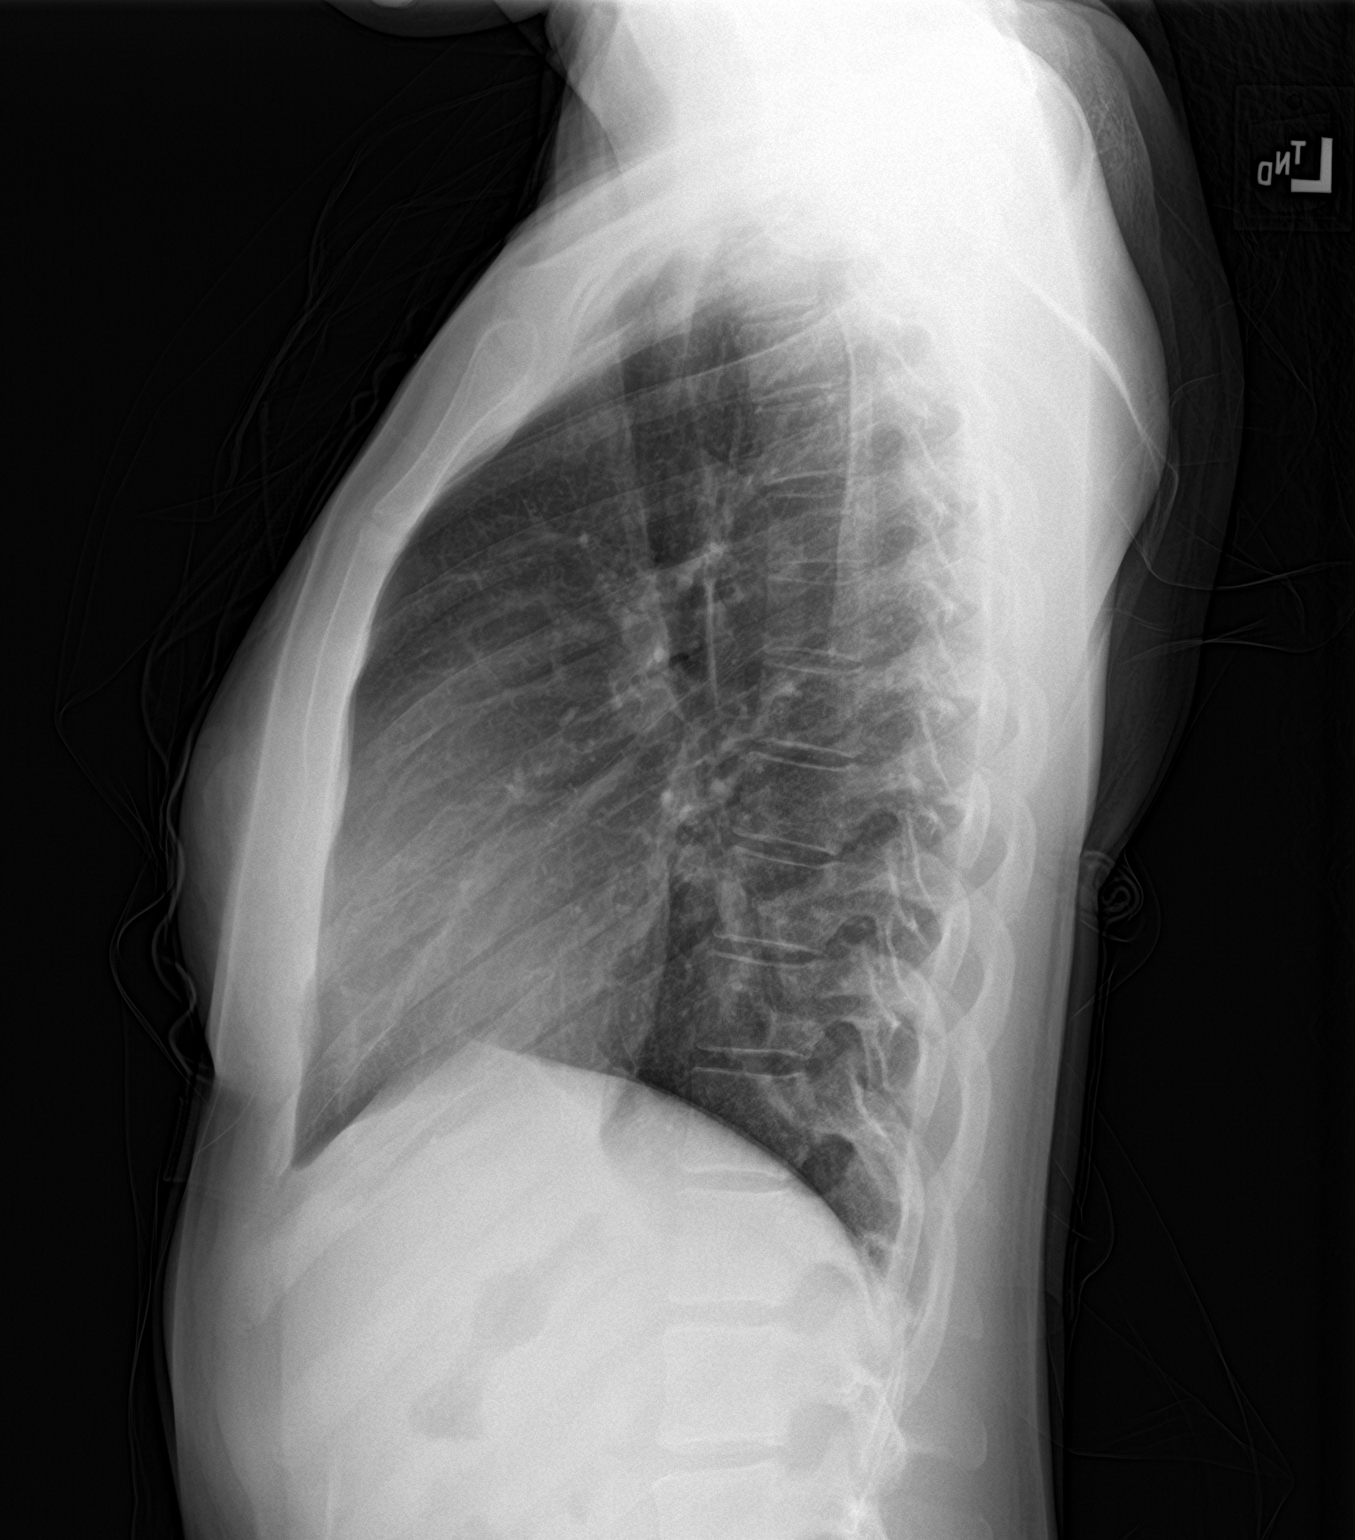

[2 of 2 positions shown; findings below may reference images not displayed]

FINDINGS: The cardiomediastinal contours are normal. The lungs are clear.
Pulmonary vasculature is normal. No consolidation, pleural effusion,
or pneumothorax. No acute osseous abnormalities are seen.
IMPRESSION: Negative radiographs of the chest.

## 2020-09-20 MED ORDER — PANTOPRAZOLE SODIUM 40 MG PO TBEC: 40.0000 mg | DELAYED_RELEASE_TABLET | Freq: Every day | ORAL | 3 refills | Status: DC

## 2020-09-20 NOTE — Telephone Encounter (Signed)
Patient called stating that she has had sharp chest pain Starts incenter of her chest and radiates to left side. I dose not travel into her jaw arm of back. She states that the pain has been constant today.  She has not done any activity that would have injured her chest. She states that the pain will burn sometimes but is mostly sharp.She has never been dx with reflux She denies heart Hx. She has not traveled recently.  The pain cause hot flashes and sweats. Per protocol patient will go to ER for evaluation Pain is present now and she rates it at 7. Care advice read to patient.  She verbalized understanding.  Reason for Disposition . [1] Chest pain (or "angina") comes and goes AND [2] is happening more often (increasing in frequency) or getting worse (increasing in severity) (Exception: chest pains that last only a few seconds)  Answer Assessment - Initial Assessment Questions 1. LOCATION: "Where does it hurt?"       Middle to left 2. RADIATION: "Does the pain go anywhere else?" (e.g., into neck, jaw, arms, back)     no 3. ONSET: "When did the chest pain begin?" (Minutes, hours or days)      yesterday 4. PATTERN "Does the pain come and go, or has it been constant since it started?"  "Does it get worse with exertion?"     consistent today 5. DURATION: "How long does it last" (e.g., seconds, minutes, hours)     2-3 second then 5 minute break and repeats again 6. SEVERITY: "How bad is the pain?"  (e.g., Scale 1-10; mild, moderate, or severe)    - MILD (1-3): doesn't interfere with normal activities     - MODERATE (4-7): interferes with normal activities or awakens from sleep    - SEVERE (8-10): excruciating pain, unable to do any normal activities      7 7. CARDIAC RISK FACTORS: "Do you have any history of heart problems or risk factors for heart disease?" (e.g., angina, prior heart attack; diabetes, high blood pressure, high cholesterol, smoker, or strong family history of heart disease)      no 8. PULMONARY RISK FACTORS: "Do you have any history of lung disease?"  (e.g., blood clots in lung, asthma, emphysema, birth control pills)     IUD 9. CAUSE: "What do you think is causing the chest pain?"    unsure 10. OTHER SYMPTOMS: "Do you have any other symptoms?" (e.g., dizziness, nausea, vomiting, sweating, fever, difficulty breathing, cough)       Hot flashes sweaty 11. PREGNANCY: "Is there any chance you are pregnant?" "When was your last menstrual period?"       No myrana  Protocols used: CHEST PAIN-A-AH
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
## 2020-09-20 NOTE — ED Triage Notes (Signed)
Started with central chest pain this AM. Hx costochondritis but this feels different. Pt feels like going to pass out and asking for water; explained need to triage and do vitals first.  Worse when eating today.  VSS. No fever or cough.

## 2020-10-31 ENCOUNTER — Ambulatory Visit: Payer: Self-pay

## 2020-10-31 NOTE — Telephone Encounter (Signed)
Patient called and says she went to the UC yesterday and was diagnosed with the flu by a positive test. She says she was COVD tested and it was negative, then tested for the flu and it was positive. She says she was prescribed Tamiflu, but the pharmacist advised not to take it due the side effects and the patient having anxiety and also told her since the symptoms have been longer than 48 hours it would not work. She called and asked about taking Dayquil, wants to know if it will interact on her based on the allergies to medications she has, if the components that make up dayquil are related to the antibiotic allergies she has. I advised to call the pharmacy about that question. She also asks if she could come to the office to be retested for the flu. I advised I will send these questions to Dr. Carlynn Purl and someone from the office will call her with the recommendations.  Reason for Disposition . [1] Taking antiviral medication AND [2] has question about the medication that triager can't answer  Answer Assessment - Initial Assessment Questions 1. DIAGNOSIS CONFIRMATION: "When was the influenza diagnosed?" "By whom?" "Did you get a test for it?"     Yesterday tested positive 2. MEDICINES: "Were you prescribed any medications for the influenza?"  (e.g., zanamivir [Relenza], oseltamavir [Tamiflu]).      Tamiflu 3. ONSET of SYMPTOMS: "When did your symptoms start?"      Muscle aches started yesterday, last week headaches/sneezing 4. SYMPTOMS: "What symptoms are you most concerned about?" (e.g., runny nose, stuffy nose, sore throat, cough, breathing difficulty, fever)      Body aches 5. COUGH: "How bad is the cough?"     No 6. FEVER: "Do you have a fever?" If Yes, ask: "What is your temperature, how was it measured, and when did it start?"     No fever 7. RESPIRATORY DISTRESS: "Are you having any trouble breathing?" If yes, ask: "Describe your breathing."      No 8. FLU VACCINE: "Did you receive a flu  shot this year?" (e.g., seasonal influenza, H1N1)     No 9. PREGNANCY: "Is there any chance you are pregnant?" "When was your last menstrual period?"     No, on IUD 10. HIGH RISK for COMPLICATIONS: "Do you have any heart or lung problems? Do you have a weakened immune system?" (e.g., CHF, COPD, asthma, HIV positive, chemotherapy, renal failure, diabetes mellitus, sickle cell anemia)       No  Protocols used: INFLUENZA FOLLOW-UP CALL-A-AH

## 2020-11-02 ENCOUNTER — Telehealth: Payer: Self-pay

## 2020-11-02 NOTE — Telephone Encounter (Signed)
Copied from CRM [id number]. Topic: General - Other >> Nov 02, 2020  1:21 PM Jaquita Rector A wrote: Reason for CRM: Patient called asking to speak to Dr Carlynn Purl nurse state that this was a follow up from a conversation recently. Please call Ph# [phone]

## 2020-11-30 ENCOUNTER — Ambulatory Visit
Admission: EM | Admit: 2020-11-30 | Discharge: 2020-11-30 | Disposition: A | Discharge status: Home / Self Care | Payer: Federal, State, Local not specified - PPO | Attending: Family Medicine | Admitting: Family Medicine

## 2020-11-30 ENCOUNTER — Encounter: Payer: Self-pay | Admitting: Emergency Medicine

## 2020-11-30 ENCOUNTER — Ambulatory Visit: Payer: Self-pay

## 2020-11-30 DIAGNOSIS — J029 Acute pharyngitis, unspecified: Secondary | ICD-10-CM | POA: Diagnosis present

## 2020-11-30 LAB — POCT RAPID STREP A (OFFICE): Rapid Strep A Screen: NEGATIVE

## 2020-11-30 NOTE — Discharge Instructions (Addendum)
I believe that your sore  throat is due to allergies and post nasal drip.  Your EKG was not concerning.  Your strep test was negative.  Flonase and zyrtec for symptoms.  Follow up as needed for continued or worsening symptoms

## 2020-11-30 NOTE — Telephone Encounter (Signed)
Patient called stating that she was exposed to Strep throat at Thanksgiving and is now worried that she should have been treated.  She states that she has had sore throat and it went away but has come back on Friday. She states that she also is experiencing heart palpations and her chest is sore.  She states she has not had fever no runny nose and no red eyes.. She states that she has been reading on internet that she could have "RF" with inflammation of her chest.  She has tested negative to Covid-19 today. Per protocol patient will go to UC for evaluation. I offered Virtual but see seemed concerned that she was not going to get seen so we decided UC Patient already had scheduled appointment in office Friday.  She was advised that she should call back to cancel if she feels she will not need this appointment. She verbalized understanding of all information. Will rout note to office.  Reason for Disposition . [1] Exposure to family member (or spouse or boyfriend/girlfriend) with test-proven strep AND [2] within last 10 days  Answer Assessment - Initial Assessment Questions 1. ONSET: "When did the throat start hurting?" (Hours or days ago)     Thanksgiving  Came back friday 2. SEVERITY: "How bad is the sore throat?" (Scale 1-10; mild, moderate or severe)   - MILD (1-3):  doesn't interfere with eating or normal activities   - MODERATE (4-7): interferes with eating some solids and normal activities   - SEVERE (8-10):  excruciating pain, interferes with most normal activities   - SEVERE DYSPHAGIA: can't swallow liquids, drooling     Friday Saturday and Sunday definetly 3. STREP EXPOSURE: "Has there been any exposure to strep within the past week?" If Yes, ask: "What type of contact occurred?"    Yes Thanksgiving 4.  VIRAL SYMPTOMS: "Are there any symptoms of a cold, such as a runny nose, cough, hoarse voice or red eyes?"     Voice hoarse 5. FEVER: "Do you have a fever?" If Yes, ask: "What is your  temperature, how was it measured, and when did it start?"    none 6. PUS ON THE TONSILS: "Is there pus on the tonsils in the back of your throat?"    none 7. OTHER SYMPTOMS: "Do you have any other symptoms?" (e.g., difficulty breathing, headache, rash)    inflamation in chest Chest is sore to touch heart palpatations 8. PREGNANCY: "Is there any chance you are pregnant?" "When was your last menstrual period?"    No birth control  Protocols used: SORE THROAT-A-AH

## 2020-11-30 NOTE — ED Triage Notes (Signed)
Pt had sore throat around thanksgiving for a few days then it got better.  Pt was exposed to strep.  Has taking covid test that was negative.  Pt did was flu and covid testing.
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
## 2020-12-02 ENCOUNTER — Ambulatory Visit: Payer: Federal, State, Local not specified - PPO | Admitting: Family Medicine

## 2020-12-03 LAB — COVID-19, FLU A+B AND RSV
Influenza A, NAA: NOT DETECTED
Influenza B, NAA: NOT DETECTED
RSV, NAA: NOT DETECTED
SARS-CoV-2, NAA: NOT DETECTED

## 2020-12-26 ENCOUNTER — Ambulatory Visit: Payer: Self-pay

## 2021-02-24 ENCOUNTER — Telehealth: Payer: Self-pay

## 2021-02-24 DIAGNOSIS — L219 Seborrheic dermatitis, unspecified: Secondary | ICD-10-CM

## 2021-02-24 NOTE — Telephone Encounter (Signed)
Copied from CRM [id number]. Topic: General - Other >> Feb 24, 2021 10:50 AM Tamela Oddi wrote: Reason for CRM: Patient would like the nurse to call her regarding a referral to a dermatologist.  She is not sure if she needs to see a specialist and would like advice on OTC medication.  Please call to discuss at [phone]

## 2021-02-24 NOTE — Addendum Note (Signed)
Addended by: Davene Costain on: 02/24/2021 01:38 PM   Modules accepted: Orders

## 2021-03-03 ENCOUNTER — Ambulatory Visit: Payer: Self-pay

## 2021-03-03 NOTE — Telephone Encounter (Signed)
Talked with patient and advised her that we have no openings but that she might need to go to the ER due to her symptoms and she said that she was at urgent care already.

## 2021-03-03 NOTE — Telephone Encounter (Signed)
Patient called and says she has a headache since Wednesday night after hitting her head on gym equipment on Wednesday around 7pm. She says the headache is off and on at a 4-5 during the day, 8-9 at night. She says since yesterday she's noticed more sensitivity to light when driving, more of a headache to the frontal part of her head when turning her head from side to side, left side mild neck pain, and not able to wear her contacts due to poor vision with them in. She denies any sinus issues. She says she's sitting at the UC and is waiting to be seen, but she thought she would call to see if she could be seen in the office sooner. I advised no available appointments today and to stay at the [facility] for evaluation is the best place for her to be at this time. Advised to call the office for a follow up appointment after the UC evaluation. Patient verbalized understanding.   Reason for Disposition . [1] MILD-MODERATE headache AND [2] present > 72 hours  Answer Assessment - Initial Assessment Questions 1. LOCATION: "Where does it hurt?"      Top of the head on the right side 2. ONSET: "When did the headache start?" (Minutes, hours or days)      Wednesday night 3. PATTERN: "Does the pain come and go, or has it been constant since it started?"     Comes and goes, no medications 4. SEVERITY: "How bad is the pain?" and "What does it keep you from doing?"  (e.g., Scale 1-10; mild, moderate, or severe)   - MILD (1-3): doesn't interfere with normal activities    - MODERATE (4-7): interferes with normal activities or awakens from sleep    - SEVERE (8-10): excruciating pain, unable to do any normal activities        4-5 during the day; 7-8 at night 5. RECURRENT SYMPTOM: "Have you ever had headaches before?" If Yes, ask: "When was the last time?" and "What happened that time?"      No 6. CAUSE: "What do you think is causing the headache?"     I think it's because I hit my head 7. MIGRAINE: "Have you been  diagnosed with migraine headaches?" If Yes, ask: "Is this headache similar?"      No 8. HEAD INJURY: "Has there been any recent injury to the head?"      Hit head at the gym on the metal squat rack on Wednesday around 7p 9. OTHER SYMPTOMS: "Do you have any other symptoms?" (fever, stiff neck, eye pain, sore throat, cold symptoms)     Frontal headache when turn head, eye pain when blink hard or move head too much, no stiff neck, neck pain (a little to the left side) 10. PREGNANCY: "Is there any chance you are pregnant?" "When was your last menstrual period?"       No  Protocols used: HEADACHE-A-AH

## 2021-03-06 ENCOUNTER — Other Ambulatory Visit: Payer: Self-pay

## 2021-03-06 ENCOUNTER — Encounter: Payer: Self-pay | Admitting: Family Medicine

## 2021-03-06 ENCOUNTER — Ambulatory Visit (INDEPENDENT_AMBULATORY_CARE_PROVIDER_SITE_OTHER): Payer: 59 | Admitting: Family Medicine

## 2021-03-06 ENCOUNTER — Ambulatory Visit: Payer: Self-pay | Admitting: *Deleted

## 2021-03-06 DIAGNOSIS — G44309 Post-traumatic headache, unspecified, not intractable: Secondary | ICD-10-CM

## 2021-03-06 NOTE — Telephone Encounter (Signed)
Called patient to discuss she states just went to urgent care Friday and they diagnosed her with concussion and wrote her out until Sunday, but she is still having symptoms.  She states she does not want to go back to urgent care. She asked if you had read all comments left from call center?  I had an appointment today for her at 2:20 with [facility] and as we were on the phone I guess call center booked it first so I had to call patient back to cancel and she was upset.  Please call patient to let her know what to do?  We have no available appointments this week? Does she need to go back to urgent care, she would really like for you to call her and discuss.  I am leaving at 2:20.  So please follow-up or have someone call her back regarding.  I also went ahead and wrote her out of work for 1 more day until yall could discuss her care.

## 2021-03-06 NOTE — Telephone Encounter (Signed)
  C/o persistent headache after hitting top of head on Wednesday 03/01/21 at gym when she stood up and hit top of head of bar and did not realize it was there. Headache continues to be noted even after taking tylenol. Patient was seen on Friday at urgent care and was written out of work until Monday , today. Patient reports she still has sensitivity to light and is wearing her glasses instead of contacts due to sensitivity to light.denies dizziness, lightheadedness, nausea or vomiting. Denies blurred vision , memory loss or losing consciousness when hitting head. Top of head is sore to touch, not broken skin reported that she can tell. Reported loss of balance when getting up to fast at times and left upper neck is sore. Patient did report she felt like she was going to pass out of Saturday but does not feel that way now. Patient would like to know if she can get earlier appt than March 25 to see PCP. Patient would like to know if she should stay out of work or have limitations due to head injury. Patient is to start back training in gym and cheerleading this week and would like to know if she is cleared to do more strenuous activity. Encouraged patient not to resume strenuous activity until notification from PCP. Please advise . Care advise given. Patient verbalized understanding of care advise and to call back or go to ED if symptoms worsen.   Reason for Disposition . [1] After 72 hours AND [2] headache persists  Answer Assessment - Initial Assessment Questions 1. MECHANISM: "How did the injury happen?" For falls, ask: "What height did you fall from?" and "What surface did you fall against?"      Hit head against weight machine in gym on Wednesday. 2. ONSET: "When did the injury happen?" (Minutes or hours ago)      Wednesday 03/01/21 3. NEUROLOGIC SYMPTOMS: "Was there any loss of consciousness?" "Are there any other neurological symptoms?"      No. 4. MENTAL STATUS: "Does the person know who he is, who you  are, and where he is?"      Yes  5. LOCATION: "What part of the head was hit?"      Top of head 6. SCALP APPEARANCE: "What does the scalp look like? Is it bleeding now?" If Yes, ask: "Is it difficult to stop?"      Not sure but did not think there was any bleeding after hitting head 7. SIZE: For cuts, bruises, or swelling, ask: "How large is it?" (e.g., inches or centimeters)      None noted 8. PAIN: "Is there any pain?" If Yes, ask: "How bad is it?"  (e.g., Scale 1-10; or mild, moderate, severe)     Yes top of head is sore 9. TETANUS: For any breaks in the skin, ask: "When was the last tetanus booster?"     na 10. OTHER SYMPTOMS: "Do you have any other symptoms?" (e.g., neck pain, vomiting)       Left upper neck is sore and pain and soreness to top of head 11. PREGNANCY: "Is there any chance you are pregnant?" "When was your last menstrual period?"       na  Protocols used: HEAD INJURY-A-AH

## 2021-03-06 NOTE — Patient Instructions (Signed)
It was great to see you!  Our plans for today:  - I advise complete mental and physical rest for the next 48 hours - no screens, reading, work, exercise, etc. You may return to work on Thursday and increase your activity gradually. Start with 30 minutes to an hour at a time. I provided you a note for work and cheer. - If you develop new or worsening symptoms, nausea, vomiting, vision changes, confusion, come back to be seen.   Take care and seek immediate care sooner if you develop any concerns.   Dr. Linwood Dibbles   Concussion, Adult  A concussion is a brain injury from a hard, direct hit (trauma) to your head or body. This direct hit causes your brain to quickly shake back and forth inside your skull. A concussion may also be called a mild traumatic brain injury (TBI). Healing from this injury can take time. What are the causes? This condition is caused by:  A direct hit to your head, such as: ? Running into a player during a game. ? Being hit in a fight. ? Hitting your head on a hard surface.  A quick and sudden movement of the head or neck, such as in a car crash. What are the signs or symptoms? The signs of a concussion can be hard to notice. They may be missed by you, family members, and doctors. You may look fine on the outside but may not act or feel normal. Physical symptoms  Headaches.  Being dizzy.  Problems with body balance.  Being sensitive to light or noise.  Vomiting or feeling like you may vomit.  Being tired.  Problems seeing or hearing.  Not sleeping or eating as you used to.  Seizure. Mental and emotional symptoms  Feeling grouchy (irritable).  Having mood changes.  Problems remembering things.  Trouble focusing your mind (concentrating), organizing, or making decisions.  Being slow to think, act, react, speak, or read.  Feeling worried or nervous (anxious).  Feeling sad (depressed). How is this treated? This condition may be treated  by:  Stopping sports or activity if you are injured. If you hit your head or have signs of concussion: ? Do not return to sports or activities the same day. ? Get checked by a doctor before you return to your activities.  Resting your body and your mind.  Being watched carefully, often at home.  Medicines to help with symptoms such as: ? Headaches. ? Feeling like you may vomit. ? Problems with sleep.  Avoiding alcohol and drugs.  Being asked to go to a concussion clinic or a place to help you recover (rehabilitation center). Recovery from a concussion can take time. Return to activities only:  When you are fully healed.  When your doctor says it is safe. Avoid taking strong pain medicines (opioids) for a concussion. Follow these instructions at home: Activity  Limit activities that need a lot of thought or focus, such as: ? Homework or work for your job. ? Watching TV. ? Using the computer or phone. ? Playing memory games and puzzles.  Rest. Rest helps your brain heal. Make sure you: ? Get plenty of sleep. Most adults should get 7-9 hours of sleep each night. ? Rest during the day. Take naps or breaks when you feel tired.  Avoid activity like exercise until your doctor says its safe. Stop any activity that makes symptoms worse.  Do not do activities that could cause a second concussion, such as riding a  bike or playing sports.  Ask your doctor when you can return to your normal activities, such as school, work, sports, and driving. Your ability to react may be slower. Do not do these activities if you are dizzy. General instructions  Take over-the-counter and prescription medicines only as told by your doctor.  Do not drink alcohol until your doctor says you can.  Watch your symptoms and tell other people to do the same. Other problems can occur after a concussion. Older adults have a higher risk of serious problems.  Tell your work Production designer, theatre/television/film, teachers, Tax adviser,  school counselor, coach, or Event organiser about your injury and symptoms. Tell them about what you can or cannot do.  Keep all follow-up visits as told by your doctor. This is important.   How is this prevented? It is very important that you do not get another brain injury. In rare cases, another injury can cause brain damage that will not go away, brain swelling, or death. The risk of this is greatest in the first 7-10 days after a head injury. To avoid injuries:  Stop activities that could lead to a second concussion, such as contact sports, until your doctor says it is okay.  When you return to sports or activities: ? Do not crash into other players. This is how most concussions happen. ? Follow the rules. ? Respect other players. Do not engage in violent behavior while playing.  Get regular exercise. Do strength and balance training.  Wear a helmet that fits you well during sports, biking, or other activities.  Helmets can help protect you from serious skull and brain injuries, but they do not protect you from a concussion. Even when wearing a helmet, you should avoid being hit in the head. Contact a doctor if:  Your symptoms do not get better.  You have new symptoms.  You have another injury. Get help right away if:  You have bad headaches or your headaches get worse.  You feel weak or numb in any part of your body.  You feel mixed up (confused).  Your balance gets worse.  You vomit often.  You feel more sleepy than normal.  You cannot speak well, or have slurred speech.  You have a seizure.  Others have trouble waking you up.  You have changes in how you act.  You have changes in how you see (vision).  You pass out (lose consciousness). These symptoms may be an emergency. Do not wait to see if the symptoms will go away. Get medical help right away. Call your local emergency services (911 in the [place] Do not drive yourself to the hospital. Summary  A  concussion is a brain injury from a hard, direct hit (trauma) to your head or body.  This condition is treated with rest and careful watching of symptoms.  Ask your doctor when you can return to your normal activities, such as school, work, or driving.  Get help right away if you have a very bad headache, feel weak in any part of your body, have a seizure, have changes in how you act or see, or if you are mixed up or more sleepy than normal. This information is not intended to replace advice given to you by your health care provider. Make sure you discuss any questions you have with your health care provider. Document Revised: 10/22/2019 Document Reviewed: 10/22/2019 Elsevier Patient Education  2021 [employer].

## 2021-03-06 NOTE — Assessment & Plan Note (Signed)
Concussion after head injury at gym with cognitive and physical sx. Recommend mental and physical rest for 48 hours with graduated return to activity as tolerated. Note provided for work and [employer]. No red flags to warrant imaging at this time. F/u prn.

## 2021-03-06 NOTE — Progress Notes (Signed)
   SUBJECTIVE:   CHIEF COMPLAINT / HPI:   Concussion - hit head on gym equipment 3/9 with subsequent headache. Had difficulty sleeping that night. Headache returned the next day. Did not lose consciousness. No N/V. - seen subsequently at [facility] Friday, no notes available for review. - ongoing R sided headache with L side neck pain, sensitivity to light, some dizziness. - no vision changes, N/V, confusion. - has had some trouble concentrating - had been working from home some. Works at [employer] as Engineer, mining, at computer most of the time. Makes headache worse.  - screens make worse. - also cheers  ACE score Physical: 2 Cognitive: 3 Sleep: 1 Symptoms worsen with physical and cognitive activity.  OBJECTIVE:   BP 120/78   Pulse 98   Temp 98 F (36.7 C) (Oral)   Resp 16   Ht 5\' 6"  (1.676 m)   Wt 129 lb 14.4 oz (58.9 kg)   SpO2 99%   BMI 20.97 kg/m   Gen: well appearing, in NAD Card: RRR Lungs: CTAB Ext: WWP, no edema MSK: Full ROM, strength 5/5 to U/LE bilaterally, normal gait.  No edema.  Neuro: Alert and oriented x4, speech normal.  Optic field normal. PERRL, Extraocular movements intact.  Intact symmetric sensation to light touch of face and extremities bilaterally.  Hearing grossly intact bilaterally.  Tongue protrudes normally with no deviation.  Shoulder shrug, smile symmetric. Finger to nose normal. Immediate and delayed recall intact.    ASSESSMENT/PLAN:   Post-concussion headache Concussion after head injury at gym with cognitive and physical sx. Recommend mental and physical rest for 48 hours with graduated return to activity as tolerated. Note provided for work and . No red flags to warrant imaging at this time. F/u prn.    Gaffer, DO

## 2021-03-07 NOTE — Telephone Encounter (Unsigned)
Copied from CRM [id number]. Topic: General - Other >> Mar 07, 2021  8:21 AM Tamela Oddi wrote: Reason for CRM: Patient called to ask if the work note she requested could be loaded to her My Chart.  She stated she only sees her after care summary.  Please let patient know when that can be done.  CB# [phone]

## 2021-03-09 ENCOUNTER — Encounter: Payer: Self-pay | Admitting: Family Medicine

## 2021-03-09 ENCOUNTER — Ambulatory Visit: Payer: Federal, State, Local not specified - PPO | Admitting: Family Medicine

## 2021-03-09 ENCOUNTER — Other Ambulatory Visit: Payer: Self-pay

## 2021-03-09 ENCOUNTER — Emergency Department
Admission: EM | Admit: 2021-03-09 | Discharge: 2021-03-09 | Disposition: A | Discharge status: Home / Self Care | Payer: 59 | Attending: Emergency Medicine | Admitting: Emergency Medicine

## 2021-03-09 DIAGNOSIS — Z91040 Latex allergy status: Secondary | ICD-10-CM | POA: Diagnosis not present

## 2021-03-09 DIAGNOSIS — W228XXA Striking against or struck by other objects, initial encounter: Secondary | ICD-10-CM | POA: Insufficient documentation

## 2021-03-09 DIAGNOSIS — Z91010 Allergy to peanuts: Secondary | ICD-10-CM | POA: Diagnosis not present

## 2021-03-09 DIAGNOSIS — S060X0A Concussion without loss of consciousness, initial encounter: Secondary | ICD-10-CM | POA: Diagnosis not present

## 2021-03-09 DIAGNOSIS — Y9239 Other specified sports and athletic area as the place of occurrence of the external cause: Secondary | ICD-10-CM | POA: Diagnosis not present

## 2021-03-09 DIAGNOSIS — M542 Cervicalgia: Secondary | ICD-10-CM | POA: Insufficient documentation

## 2021-03-09 DIAGNOSIS — S0990XA Unspecified injury of head, initial encounter: Secondary | ICD-10-CM | POA: Diagnosis present

## 2021-03-09 NOTE — ED Triage Notes (Signed)
Pt states a week ago she hit her head on an arm bar. Pt denies LOC. Pt states she was seen by urgent care and PCP and was diagnosed with a concussion. Pt states pain is not getting better. Pt states pain in the head and down the neck. Pt moving head in triage with no deficits noted. NAD noted.

## 2021-03-09 NOTE — Discharge Instructions (Signed)
Please take Tylenol, up to 1000mg  4x daily to treat headache. Return for any worsening headache, vomiting, loss of consciousness or any other worsening symptoms.

## 2021-03-09 NOTE — ED Notes (Signed)
See triage note  Presents s/p head injury last week  States she hit the top of her head on a metal bar  Was seen at [facility] and by her PCP  conts' to have some dizziness  Also having some discomfort to left side of neck
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
## 2021-03-10 ENCOUNTER — Telehealth: Payer: Self-pay | Admitting: Family Medicine

## 2021-03-10 NOTE — Telephone Encounter (Signed)
Patient called requesting to schedule an appointment with the concussion clinic. She was seen in the ED yesterday and was referred to [place].  Spot blocked on Dr Zollie Pee schedule for Wednesday.

## 2021-03-13 ENCOUNTER — Ambulatory Visit: Payer: Self-pay | Admitting: *Deleted

## 2021-03-13 NOTE — Telephone Encounter (Signed)
Called patient and she is ok with just keeping her appt for 03-16-2021 but just wanted to let the dr know about this issue.

## 2021-03-13 NOTE — Telephone Encounter (Signed)
C/o nausea and bloating. Recent diagnosis with concussion. Seen in office 03/06/21 for post concussion. Patient reports she was concerned with nausea due to symptoms to call back for nausea and vomiting due to concussion . Patient reports she became nauseated after eating pasta and now feels bloated . Reports feeling some reflux. Patient reports she has omperzole and will try to see if that helps with how she feels. Patient reports she was walking to the store and felt dizziness. Denies chest pain , difficulty breathing or visual disturbances. Care advise given. Patient verbalized understanding of care advise and to call back or go to [facility] or ED if symptoms worsen.   Reason for Disposition . Unexplained nausea  Answer Assessment - Initial Assessment Questions 1. NAUSEA SEVERITY: "How bad is the nausea?" (e.g., mild, moderate, severe; dehydration, weight loss)   - MILD: loss of appetite without change in eating habits   - MODERATE: decreased oral intake without significant weight loss, dehydration, or malnutrition   - SEVERE: inadequate caloric or fluid intake, significant weight loss, symptoms of dehydration     Mild  2. ONSET: "When did the nausea begin?"     Today  3. VOMITING: "Any vomiting?" If Yes, ask: "How many times today?"     no 4. RECURRENT SYMPTOM: "Have you had nausea before?" If Yes, ask: "When was the last time?" "What happened that time?"     Yes .  5. CAUSE: "What do you think is causing the nausea?"     Not sure . Ate a lot of pasta and became bloated with nausea. Recent diagnosed with concussion 6. PREGNANCY: "Is there any chance you are pregnant?" (e.g., unprotected intercourse, missed birth control pill, broken condom)     na  Protocols used: NAUSEA-A-AH
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
## 2021-03-15 ENCOUNTER — Other Ambulatory Visit: Payer: Self-pay

## 2021-03-15 ENCOUNTER — Encounter: Payer: Self-pay | Admitting: Family Medicine

## 2021-03-15 ENCOUNTER — Ambulatory Visit (INDEPENDENT_AMBULATORY_CARE_PROVIDER_SITE_OTHER): Payer: 59 | Admitting: Family Medicine

## 2021-03-15 VITALS — BP 102/78 | HR 78 | Ht 66.0 in | Wt 129.4 lb

## 2021-03-15 DIAGNOSIS — M542 Cervicalgia: Secondary | ICD-10-CM

## 2021-03-15 DIAGNOSIS — R42 Dizziness and giddiness: Secondary | ICD-10-CM | POA: Diagnosis not present

## 2021-03-15 DIAGNOSIS — S060X0A Concussion without loss of consciousness, initial encounter: Secondary | ICD-10-CM

## 2021-03-15 NOTE — Patient Instructions (Signed)
Thank you for coming in today.  I've referred you to Physical Therapy.  Let us know if you don't hear from them in one week.  Try melatonin or even oral CBD for sleep if you need it.  Tylenol ibuprofen etc is ok for headache.   Next step for headache would be Topamax.   Ok to light advancing to medium exercise.   Recheck in about 2 weeks.   Work limit for 4 hours a day for now.
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
## 2021-03-17 ENCOUNTER — Ambulatory Visit (INDEPENDENT_AMBULATORY_CARE_PROVIDER_SITE_OTHER): Payer: 59 | Admitting: Family Medicine

## 2021-03-17 ENCOUNTER — Encounter: Payer: Self-pay | Admitting: Family Medicine

## 2021-03-17 ENCOUNTER — Telehealth: Payer: Self-pay | Admitting: Family Medicine

## 2021-03-17 ENCOUNTER — Other Ambulatory Visit: Payer: Self-pay

## 2021-03-17 ENCOUNTER — Other Ambulatory Visit: Payer: Self-pay | Admitting: Family Medicine

## 2021-03-17 VITALS — BP 118/72 | HR 80 | Temp 98.0°F | Resp 16 | Ht 66.0 in | Wt 127.0 lb

## 2021-03-17 DIAGNOSIS — F0781 Postconcussional syndrome: Secondary | ICD-10-CM

## 2021-03-17 DIAGNOSIS — G44309 Post-traumatic headache, unspecified, not intractable: Secondary | ICD-10-CM

## 2021-03-17 NOTE — Telephone Encounter (Signed)
Pt is calling to request a referral for [facility].  Pt was advised to schedule an appt. And pt attempted to schedule and was told that she needs an appt.Please advise CB- [phone]

## 2021-03-21 ENCOUNTER — Ambulatory Visit: Payer: Self-pay

## 2021-03-21 NOTE — Telephone Encounter (Signed)
  Pt. Reports she had a concussion 03/01/21. Saw Dr. Carlynn Purl 03/17/21 for follow up. 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 Became weak and shaky and had to stop. Wants to know if she needs to be seen again. Also would like lab work done - CBC, Thyroid labs. Spoke with Cassandra in the practice and will forward triage to PCP. Instructed pt. To rest today. Knows to go to ED for worsening of symptoms. Answer Assessment - Initial Assessment Questions 1. SYMPTOMS: "What symptom are you most concerned about?"     Dizzy, weak 2. OTHER SYMPTOMS: "Do you have any other symptoms?" (e.g., nausea, difficulty concentrating)     Headache is better 3. ONSET / PATTERN:  "Are you the same, getting better, or getting worse?"  "What's changed?"     Today  4. HEADACHE: "Do you have any headache pain?" If Yes, ask: "How bad is it?"  (Scale 1-10; or mild, moderate, severe)   - MILD - Doesn't interfere with normal activities   - MODERATE - Interferes with normal activities (e.g., work or school) or awakens from sleep   - SEVERE - Excruciating pain, unable to do any normal activities     Mild 5. mTBI: "When did your head injury happen?" "How did it occur?"      03/01/21 6. mTBI DIAGNOSIS:  "Who diagnosed your concussion?"     [facility] 7. mTBI TESTING: "Did you have a CT scan or MRI of your head?"     No 8. mTBI LAST VISIT:  "When were you seen for your head injury?"     3.25.22 9. MEDICATIONS:  "Did you receive any prescription meds?"  If Yes, ask:  "What are they?" " Were any OTC meds recommended?"     Tylenol 10. FOLLOW-UP APPT:  "Do you have a follow-up appointment?"       No  Protocols used: CONCUSSION (MTBI) LESS THAN 14 DAYS AGO FOLLOW-UP CALL-A-AH

## 2021-03-22 ENCOUNTER — Other Ambulatory Visit: Payer: Self-pay | Admitting: Family Medicine

## 2021-03-22 DIAGNOSIS — R251 Tremor, unspecified: Secondary | ICD-10-CM

## 2021-03-22 DIAGNOSIS — D72819 Decreased white blood cell count, unspecified: Secondary | ICD-10-CM

## 2021-03-22 NOTE — Telephone Encounter (Signed)
Pt informed to talk to her sport dr/neurologist. Pt has scheduled a physical with Leighton Roach for 4.13.2022 but would like to know if your able to order lab work before then. She would like a full thyroid panel and hemaglobin panel done due to shaking and trembles

## 2021-03-23 NOTE — Telephone Encounter (Signed)
Does pt need to keep her 04/05/21 appt with Adriana since the labs have been ordered?

## 2021-03-25 LAB — CBC WITH DIFFERENTIAL/PLATELET
Absolute Monocytes: 422 cells/uL (ref 200–950)
Basophils Absolute: 32 cells/uL (ref 0–200)
Basophils Relative: 0.5 %
Eosinophils Absolute: 179 cells/uL (ref 15–500)
Eosinophils Relative: 2.8 %
HCT: 41.4 % (ref 35.0–45.0)
Hemoglobin: 13.2 g/dL (ref 11.7–15.5)
Lymphs Abs: 1984 cells/uL (ref 850–3900)
MCH: 26.2 pg — ABNORMAL LOW (ref 27.0–33.0)
MCHC: 31.9 g/dL — ABNORMAL LOW (ref 32.0–36.0)
MCV: 82.3 fL (ref 80.0–100.0)
MPV: 11.3 fL (ref 7.5–12.5)
Monocytes Relative: 6.6 %
Neutro Abs: 3782 cells/uL (ref 1500–7800)
Neutrophils Relative %: 59.1 %
Platelets: 219 10*3/uL (ref 140–400)
RBC: 5.03 10*6/uL (ref 3.80–5.10)
RDW: 14.6 % (ref 11.0–15.0)
Total Lymphocyte: 31 %
WBC: 6.4 10*3/uL (ref 3.8–10.8)

## 2021-03-25 LAB — COMPLETE METABOLIC PANEL WITH GFR
AG Ratio: 1.4 (calc) (ref 1.0–2.5)
ALT: 12 U/L (ref 6–29)
AST: 15 U/L (ref 10–30)
Albumin: 4.2 g/dL (ref 3.6–5.1)
Alkaline phosphatase (APISO): 56 U/L (ref 31–125)
BUN: 19 mg/dL (ref 7–25)
CO2: 27 mmol/L (ref 20–32)
Calcium: 9.7 mg/dL (ref 8.6–10.2)
Chloride: 101 mmol/L (ref 98–110)
Creat: 0.86 mg/dL (ref 0.50–1.10)
GFR, Est African American: 108 mL/min/{1.73_m2} (ref >=60)
GFR, Est Non African American: 93 mL/min/{1.73_m2} (ref >=60)
Globulin: 2.9 g/dL (calc) (ref 1.9–3.7)
Glucose, Bld: 81 mg/dL (ref 65–99)
Potassium: 4.5 mmol/L (ref 3.5–5.3)
Sodium: 137 mmol/L (ref 135–146)
Total Bilirubin: 0.4 mg/dL (ref 0.2–1.2)
Total Protein: 7.1 g/dL (ref 6.1–8.1)

## 2021-03-25 LAB — THYROID PANEL WITH TSH
Free Thyroxine Index: 1.9 (ref 1.4–3.8)
T3 Uptake: 31 % (ref 22–35)
T4, Total: 6 ug/dL (ref 5.1–11.9)
TSH: 0.9 mIU/L

## 2021-03-27 ENCOUNTER — Encounter: Payer: Self-pay | Admitting: Family Medicine

## 2021-03-29 ENCOUNTER — Ambulatory Visit: Payer: 59 | Admitting: Family Medicine

## 2021-03-30 ENCOUNTER — Other Ambulatory Visit: Payer: Self-pay | Admitting: Physician Assistant

## 2021-03-30 DIAGNOSIS — F0781 Postconcussional syndrome: Secondary | ICD-10-CM

## 2021-04-05 ENCOUNTER — Other Ambulatory Visit (HOSPITAL_COMMUNITY)
Admission: RE | Admit: 2021-04-05 | Discharge: 2021-04-05 | Disposition: A | Discharge status: Home / Self Care | Payer: 59 | Source: Ambulatory Visit | Attending: Physician Assistant | Admitting: Physician Assistant

## 2021-04-05 ENCOUNTER — Other Ambulatory Visit: Payer: Self-pay

## 2021-04-05 ENCOUNTER — Ambulatory Visit (INDEPENDENT_AMBULATORY_CARE_PROVIDER_SITE_OTHER): Payer: 59 | Admitting: Physician Assistant

## 2021-04-05 ENCOUNTER — Encounter: Payer: Self-pay | Admitting: Physician Assistant

## 2021-04-05 VITALS — BP 118/82 | HR 88 | Temp 98.5°F | Resp 18 | Ht 66.0 in | Wt 125.3 lb

## 2021-04-05 DIAGNOSIS — F0781 Postconcussional syndrome: Secondary | ICD-10-CM | POA: Diagnosis not present

## 2021-04-05 DIAGNOSIS — Z0000 Encounter for general adult medical examination without abnormal findings: Secondary | ICD-10-CM

## 2021-04-05 DIAGNOSIS — E559 Vitamin D deficiency, unspecified: Secondary | ICD-10-CM

## 2021-04-05 DIAGNOSIS — F418 Other specified anxiety disorders: Secondary | ICD-10-CM

## 2021-04-05 DIAGNOSIS — Z114 Encounter for screening for human immunodeficiency virus [HIV]: Secondary | ICD-10-CM

## 2021-04-05 DIAGNOSIS — Z1159 Encounter for screening for other viral diseases: Secondary | ICD-10-CM

## 2021-04-05 DIAGNOSIS — D562 Delta-beta thalassemia: Secondary | ICD-10-CM | POA: Diagnosis not present

## 2021-04-05 DIAGNOSIS — Z113 Encounter for screening for infections with a predominantly sexual mode of transmission: Secondary | ICD-10-CM
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
## 2021-04-05 NOTE — Patient Instructions (Signed)
Health Maintenance, Female Adopting a healthy lifestyle and getting preventive care are important in promoting health and wellness. Ask your health care provider about:  The right schedule for you to have regular tests and exams.  Things you can do on your own to prevent diseases and keep yourself healthy. What should I know about diet, weight, and exercise? Eat a healthy diet  Eat a diet that includes plenty of vegetables, fruits, low-fat dairy products, and lean protein.  Do not eat a lot of foods that are high in solid fats, added sugars, or sodium.   Maintain a healthy weight Body mass index (BMI) is used to identify weight problems. It estimates body fat based on height and weight. Your health care provider can help determine your BMI and help you achieve or maintain a healthy weight. Get regular exercise Get regular exercise. This is one of the most important things you can do for your health. Most adults should:  Exercise for at least 150 minutes each week. The exercise should increase your heart rate and make you sweat (moderate-intensity exercise).  Do strengthening exercises at least twice a week. This is in addition to the moderate-intensity exercise.  Spend less time sitting. Even light physical activity can be beneficial. Watch cholesterol and blood lipids Have your blood tested for lipids and cholesterol at 26 years of age, then have this test every 5 years. Have your cholesterol levels checked more often if:  Your lipid or cholesterol levels are high.  You are older than 26 years of age.  You are at high risk for heart disease. What should I know about cancer screening? Depending on your health history and family history, you may need to have cancer screening at various ages. This may include screening for:  Breast cancer.  Cervical cancer.  Colorectal cancer.  Skin cancer.  Lung cancer. What should I know about heart disease, diabetes, and high blood  pressure? Blood pressure and heart disease  High blood pressure causes heart disease and increases the risk of stroke. This is more likely to develop in people who have high blood pressure readings, are of African descent, or are overweight.  Have your blood pressure checked: ? Every 3-5 years if you are 18-39 years of age. ? Every year if you are 40 years old or older. Diabetes Have regular diabetes screenings. This checks your fasting blood sugar level. Have the screening done:  Once every three years after age 40 if you are at a normal weight and have a low risk for diabetes.  More often and at a younger age if you are overweight or have a high risk for diabetes. What should I know about preventing infection? Hepatitis B If you have a higher risk for hepatitis B, you should be screened for this virus. Talk with your health care provider to find out if you are at risk for hepatitis B infection. Hepatitis C Testing is recommended for:  Everyone born from 1945 through 1965.  Anyone with known risk factors for hepatitis C. Sexually transmitted infections (STIs)  Get screened for STIs, including gonorrhea and chlamydia, if: ? You are sexually active and are younger than 26 years of age. ? You are older than 26 years of age and your health care provider tells you that you are at risk for this type of infection. ? Your sexual activity has changed since you were last screened, and you are at increased risk for chlamydia or gonorrhea. Ask your health care provider   if you are at risk.  Ask your health care provider about whether you are at high risk for HIV. Your health care provider may recommend a prescription medicine to help prevent HIV infection. If you choose to take medicine to prevent HIV, you should first get tested for HIV. You should then be tested every 3 months for as long as you are taking the medicine. Pregnancy  If you are about to stop having your period (premenopausal) and  you may become pregnant, seek counseling before you get pregnant.  Take 400 to 800 micrograms (mcg) of folic acid every day if you become pregnant.  Ask for birth control (contraception) if you want to prevent pregnancy. Osteoporosis and menopause Osteoporosis is a disease in which the bones lose minerals and strength with aging. This can result in bone fractures. If you are 65 years old or older, or if you are at risk for osteoporosis and fractures, ask your health care provider if you should:  Be screened for bone loss.  Take a calcium or vitamin D supplement to lower your risk of fractures.  Be given hormone replacement therapy (HRT) to treat symptoms of menopause. Follow these instructions at home: Lifestyle  Do not use any products that contain nicotine or tobacco, such as cigarettes, e-cigarettes, and chewing tobacco. If you need help quitting, ask your health care provider.  Do not use street drugs.  Do not share needles.  Ask your health care provider for help if you need support or information about quitting drugs. Alcohol use  Do not drink alcohol if: ? Your health care provider tells you not to drink. ? You are pregnant, may be pregnant, or are planning to become pregnant.  If you drink alcohol: ? Limit how much you use to 0-1 drink a day. ? Limit intake if you are breastfeeding.  Be aware of how much alcohol is in your drink. In the [place] one drink equals one 12 oz bottle of beer (355 mL), one 5 oz glass of wine (148 mL), or one 1 oz glass of hard liquor (44 mL). General instructions  Schedule regular health, dental, and eye exams.  Stay current with your vaccines.  Tell your health care provider if: ? You often feel depressed. ? You have ever been abused or do not feel safe at home. Summary  Adopting a healthy lifestyle and getting preventive care are important in promoting health and wellness.  Follow your health care provider's instructions about healthy  diet, exercising, and getting tested or screened for diseases.  Follow your health care provider's instructions on monitoring your cholesterol and blood pressure. This information is not intended to replace advice given to you by your health care provider. Make sure you discuss any questions you have with your health care provider. Document Revised: 12/03/2018 Document Reviewed: 12/03/2018 Elsevier Patient Education  2021 Elsevier Inc.  

## 2021-04-06 ENCOUNTER — Encounter: Payer: Self-pay | Admitting: Physician Assistant

## 2021-04-06 LAB — CERVICOVAGINAL ANCILLARY ONLY
Bacterial Vaginitis (gardnerella): NEGATIVE
Candida Glabrata: NEGATIVE
Candida Vaginitis: NEGATIVE
Chlamydia: NEGATIVE
Comment: NEGATIVE
Comment: NEGATIVE
Comment: NEGATIVE
Comment: NEGATIVE
Comment: NEGATIVE
Comment: NORMAL
Neisseria Gonorrhea: NEGATIVE
Trichomonas: NEGATIVE

## 2021-04-06 LAB — HIV ANTIBODY (ROUTINE TESTING W REFLEX): HIV 1&2 Ab, 4th Generation: NONREACTIVE

## 2021-04-06 LAB — LIPID PANEL
Cholesterol: 223 mg/dL — ABNORMAL HIGH (ref <200)
HDL: 71 mg/dL (ref >=50)
LDL Cholesterol (Calc): 137 mg/dL (calc) — ABNORMAL HIGH
Non-HDL Cholesterol (Calc): 152 mg/dL (calc) — ABNORMAL HIGH (ref <130)
Total CHOL/HDL Ratio: 3.1 (calc) (ref <5.0)
Triglycerides: 56 mg/dL (ref <150)

## 2021-04-06 LAB — HEPATITIS C ANTIBODY
Hepatitis C Ab: NONREACTIVE
SIGNAL TO CUT-OFF: 0.01 (ref <1.00)

## 2021-04-06 LAB — VITAMIN D 25 HYDROXY (VIT D DEFICIENCY, FRACTURES): Vit D, 25-Hydroxy: 49 ng/mL (ref 30–100)

## 2021-04-06 LAB — RPR: RPR Ser Ql: NONREACTIVE

## 2021-04-07 ENCOUNTER — Telehealth: Payer: Self-pay | Admitting: *Deleted

## 2021-04-07 NOTE — Chronic Care Management (AMB) (Signed)
  Care Management   Note  04/07/2021 Name: Jennifer Byrd MRN: [id number] DOB: 1996/08/19  Jennifer Byrd is a 25 y.o. year old female who is a primary care patient of Alba Cory, MD and is actively engaged with the care management team. I reached out to [employer] by phone today to assist with scheduling an initial visit with the Licensed Clinical Social Worker  Follow up plan: Telephone appointment with care management team member scheduled for: 04/21/2021  Avie Arenas Care Guide, Embedded Care Coordination [facility]  Care Management

## 2021-04-07 NOTE — Chronic Care Management (AMB) (Signed)
  Care Management   Outreach Note  04/07/2021 Name: Jennifer Byrd MRN: [id number] DOB: [birth year]  Referred by: Alba Cory, MD Reason for referral : Care Coordination (Initial outreach to schedule referral with Licensed Clinical SW)   An unsuccessful telephone outreach was attempted today. The patient was referred to the case management team for assistance with care management and care coordination.   Follow Up Plan: A HIPAA compliant phone message was left for the patient providing contact information and requesting a return call.  The care management team will reach out to the patient again over the next 7 days.  If patient returns call to provider office, please advise to call Embedded Care Management Care Guide Avie Arenas at [phone]  [facility] Guide, Embedded Care Coordination [facility]  Care Management

## 2021-04-11 ENCOUNTER — Ambulatory Visit
Admission: RE | Admit: 2021-04-11 | Discharge: 2021-04-11 | Disposition: A | Discharge status: Home / Self Care | Payer: 59 | Source: Ambulatory Visit | Attending: Physician Assistant | Admitting: Physician Assistant

## 2021-04-11 ENCOUNTER — Other Ambulatory Visit: Payer: Self-pay

## 2021-04-11 DIAGNOSIS — F0781 Postconcussional syndrome: Secondary | ICD-10-CM | POA: Insufficient documentation

## 2021-04-11 IMAGING — MR MR HEAD W/O CM
8 of 10 series · 34 of 48 positions shown · non-contrast
Comparison: Prior head CT examinations [DATE] and earlier.

CLINICAL DATA: Provided history: Postconcussion syndrome.
Additional history provided by scanning technologist: Patient
reports history of for concussions, most recently [DATE].

EXAM:
MRI HEAD WITHOUT CONTRAST
TECHNIQUE: Multiplanar, multiecho pulse sequences of the brain and surrounding
structures were obtained without intravenous contrast.

[Series 5: T1 · sagittal · 5.0mm · 0.47mm/px · 3 of 24 slices shown (1 of 2)]
[im 1/24]
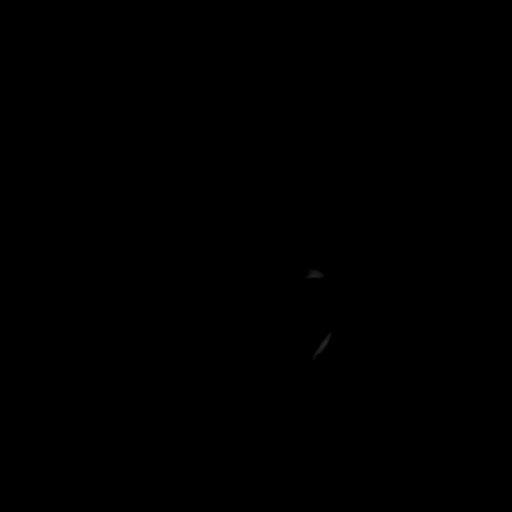
[im 12/24]
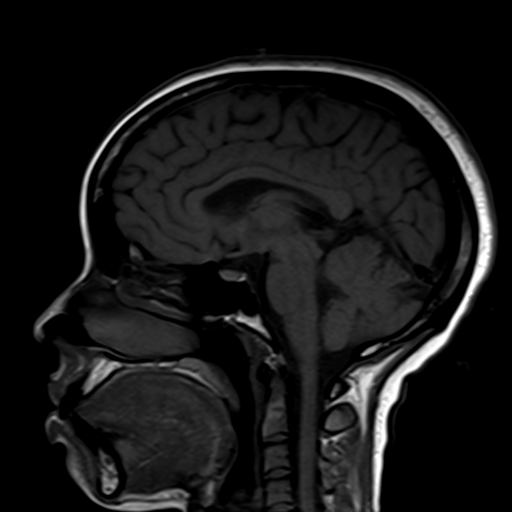
[im 24/24]
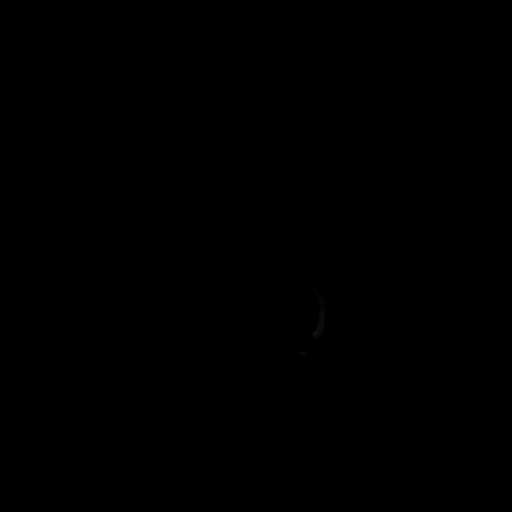

[Series 6: T2 · axial · 5.0mm · 0.45mm/px · z∈[-87,+57]mm · 4 of 27 slices shown (1 of 2)]
[im 1/27]
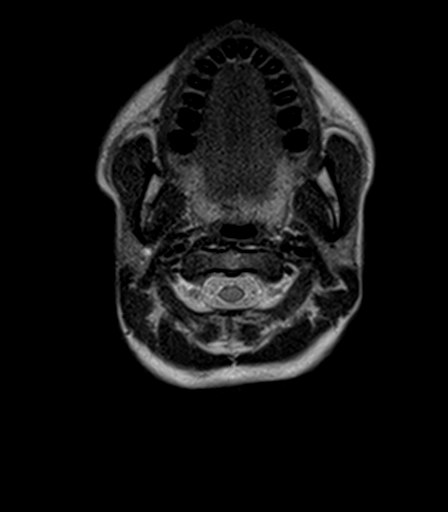
[im 9/27]
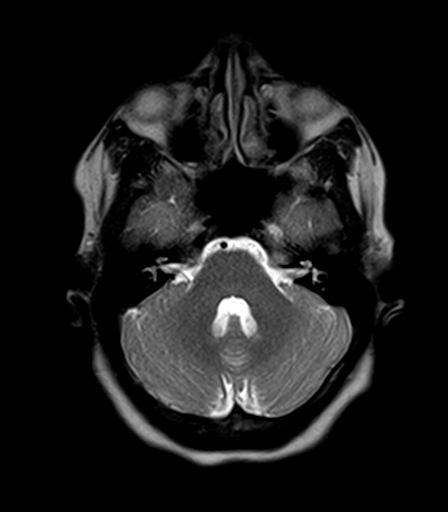
[im 18/27]
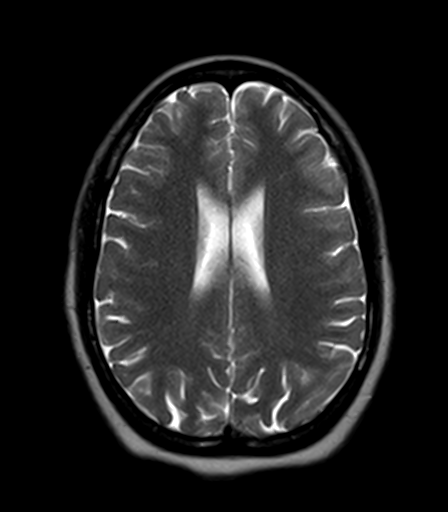
[im 27/27]
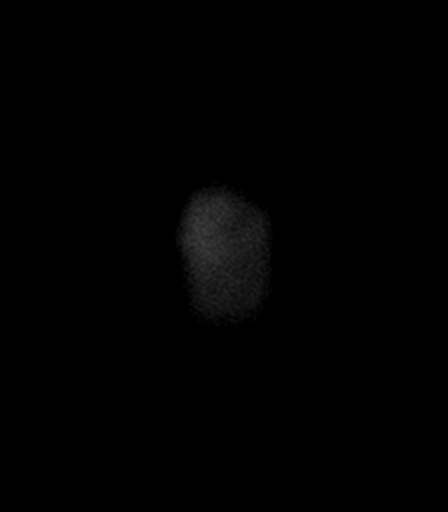

[Series 7: T1 · axial · 5.0mm · 0.90mm/px · z∈[-87,+57]mm · 4 of 27 slices shown (2 of 2)]
[im 1/27]
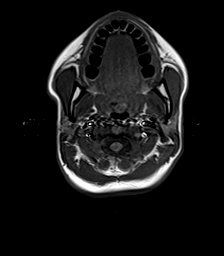
[im 9/27]
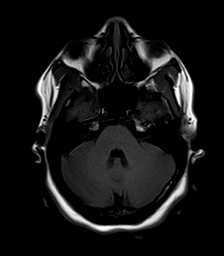
[im 18/27]
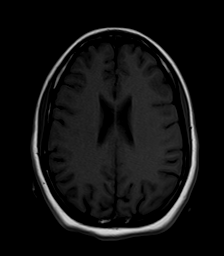
[im 27/27]
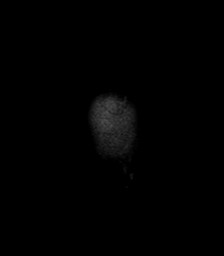

[Series 8: FLAIR · axial · 5.0mm · 1.20mm/px · z∈[-87,+57]mm · 4 of 27 slices shown]
[im 1/27]
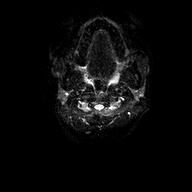
[im 9/27]
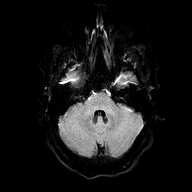
[im 18/27]
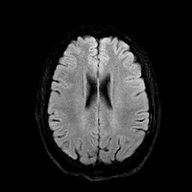
[im 27/27]
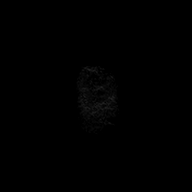

[Series 9: T2-star · axial · 5.0mm · 0.45mm/px · z∈[-87,+57]mm · 4 of 27 slices shown]
[im 1/27]
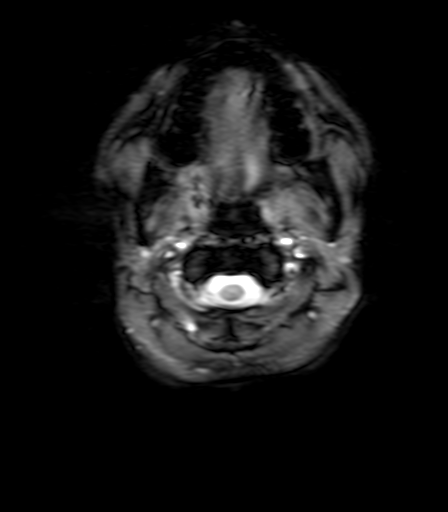
[im 9/27]
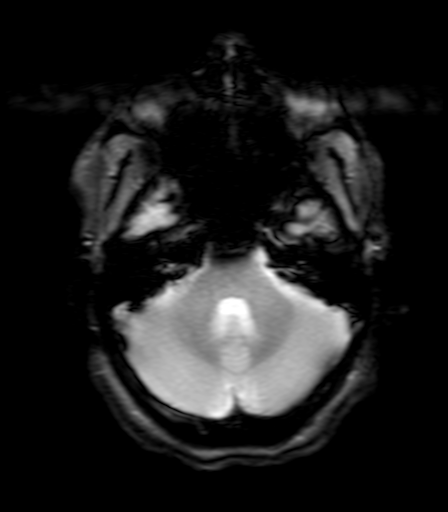
[im 18/27]
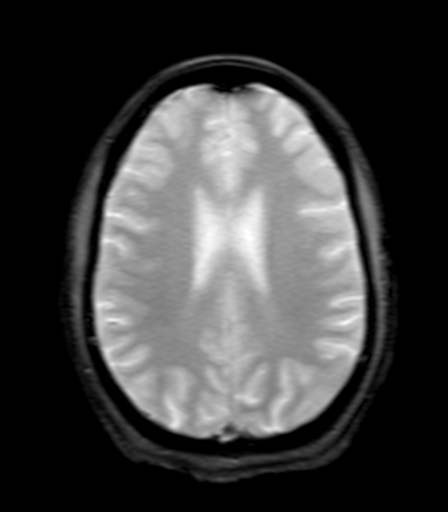
[im 27/27]
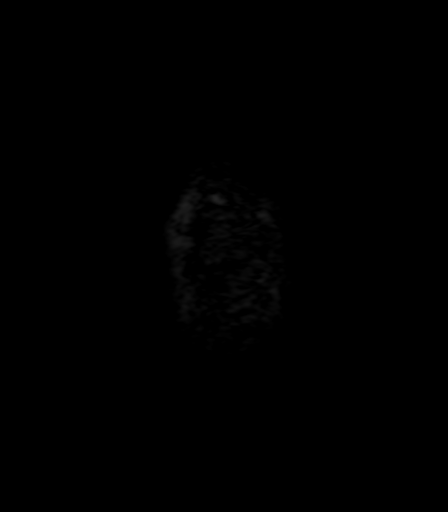

[Series 10: T2 · coronal · 5.0mm · 0.45mm/px · 4 of 33 slices shown (2 of 2)]
[im 1/33]
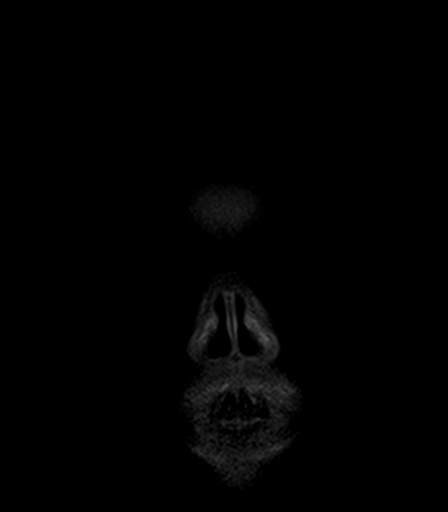
[im 11/33]
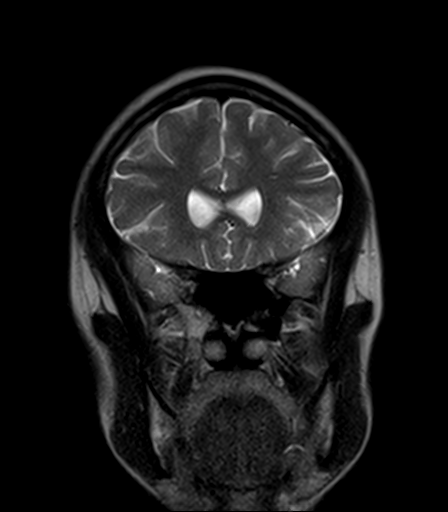
[im 22/33]
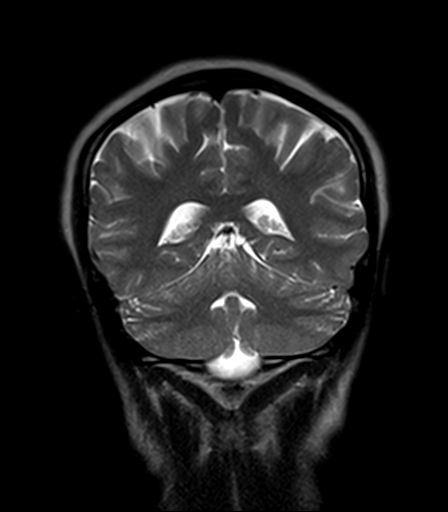
[im 33/33]
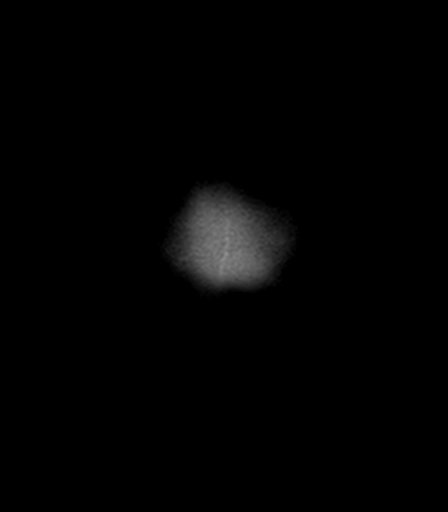

[Series 11: ax dwi_tracew · axial · 3.0mm · 0.71mm/px · z∈[-93,+59]mm · 8 of 56 slices shown]
[im 1/56]
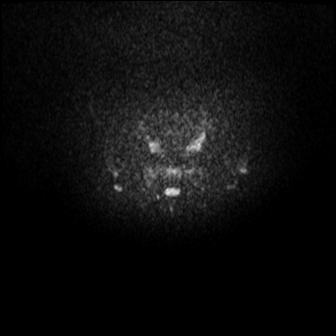
[im 8/56]
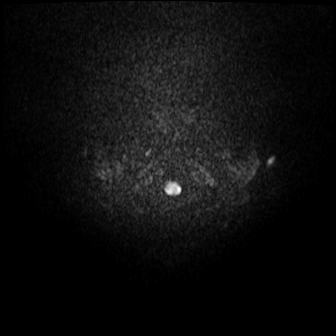
[im 16/56]
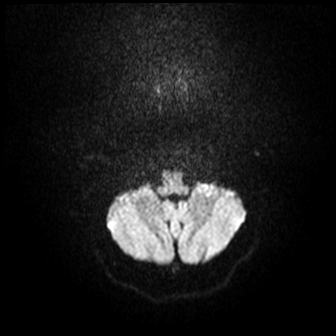
[im 24/56]
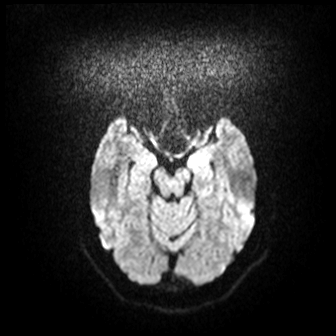
[im 32/56]
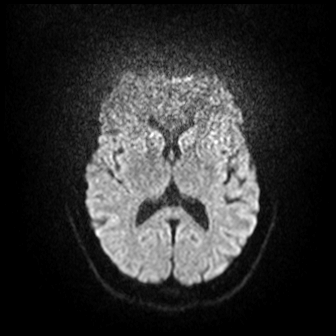
[im 40/56]
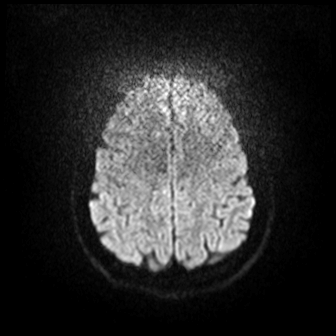
[im 48/56]
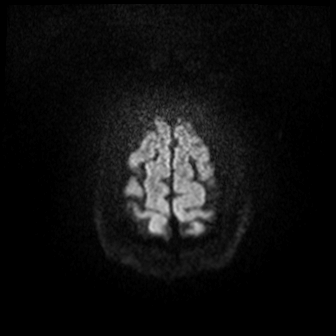
[im 56/56]
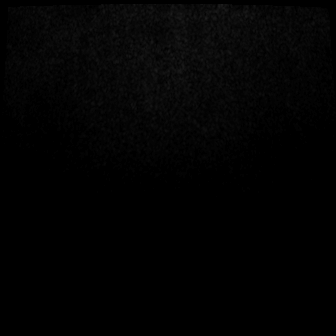

[Series 12: ax dwi_adc · axial · 3.0mm · 0.71mm/px · z∈[-93,-43]mm · 3 of 55 slices shown]
[im 1/55]
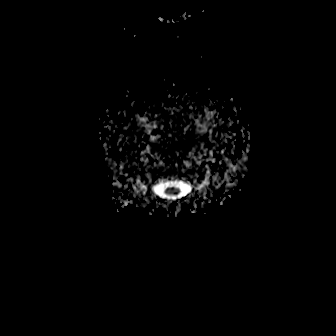
[im 10/55]
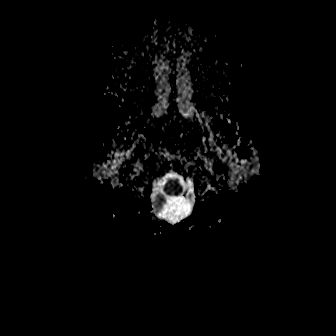
[im 19/55]
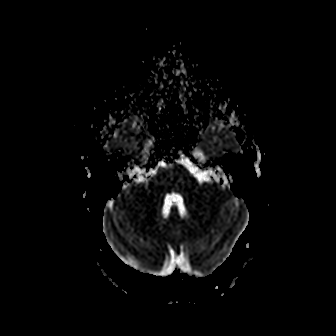

[34 of 48 positions shown; findings below may reference images not displayed]

FINDINGS: Brain:

Cerebral volume is normal.

No focal parenchymal signal abnormality is identified.

There is no acute infarct.

No evidence of intracranial mass.

No chronic intracranial blood products.

No extra-axial fluid collection.

No midline shift.

Vascular: Expected proximal arterial flow voids.

Skull and upper cervical spine: No focal marrow lesion.

Sinuses/Orbits: Visualized orbits show no acute finding. No
significant paranasal sinus disease.
IMPRESSION: Unremarkable non-contrast MRI appearance of the brain. No evidence
of acute intracranial abnormality.

## 2021-04-21 ENCOUNTER — Ambulatory Visit: Payer: 59 | Admitting: *Deleted

## 2021-04-21 DIAGNOSIS — F430 Acute stress reaction: Secondary | ICD-10-CM

## 2021-04-21 DIAGNOSIS — F418 Other specified anxiety disorders: Secondary | ICD-10-CM
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
## 2021-04-23 NOTE — Patient Instructions (Signed)
Visit Information  PATIENT GOALS:  Goals Addressed            This Visit's Progress   . Manage My Emotions       Timeframe:  Long-Range Goal Priority:  High Start Date:      05/26/21                       Expected End Date:     10/26/21                  Follow Up Date 05 /08/2021   - begin personal counseling - practice relaxation or meditation daily - start or continue a personal journal - talk about feelings with a friend, family or spiritual advisor - practice positive thinking and self-talk    Why is this important?    When you are stressed, down or upset, your body reacts too.   For example, your blood pressure may get higher; you may have a headache or stomachache.   When your emotions get the best of you, your body's ability to fight off cold and flu gets weak.   These steps will help you manage your emotions.     Notes:        Ms. Bovey was given information about Care Management services by the embedded care coordination team including:  1. Care Management services include personalized support from designated clinical staff supervised by her physician, including individualized plan of care and coordination with other care providers 2. 24/7 contact phone numbers for assistance for urgent and routine care needs. 3. The patient may stop CCM services at any time (effective at the end of the month) by phone call to the office staff.  Patient agreed to services and verbal consent obtained.   The patient verbalized understanding of instructions, educational materials, and care plan provided today and declined offer to receive copy of patient instructions, educational materials, and care plan.   Telephone follow up appointment with care management team member scheduled for:05/01/21  [name], LCSW Clinical Social Worker  [facility] Care Management [phone]

## 2021-04-24 ENCOUNTER — Other Ambulatory Visit: Payer: Self-pay | Admitting: Physician Assistant

## 2021-04-24 DIAGNOSIS — F0781 Postconcussional syndrome: Secondary | ICD-10-CM

## 2021-05-03 ENCOUNTER — Ambulatory Visit: Payer: 59

## 2021-05-12 ENCOUNTER — Telehealth: Payer: Self-pay

## 2021-05-12 NOTE — Telephone Encounter (Signed)
Called pt but she was unable tot alk at that time. Stated she will call back.
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
## 2021-05-13 ENCOUNTER — Emergency Department
Admission: EM | Admit: 2021-05-13 | Discharge: 2021-05-13 | Disposition: A | Discharge status: Home / Self Care | Payer: 59 | Attending: Emergency Medicine | Admitting: Emergency Medicine

## 2021-05-13 ENCOUNTER — Other Ambulatory Visit: Payer: Self-pay

## 2021-05-13 DIAGNOSIS — R109 Unspecified abdominal pain: Secondary | ICD-10-CM | POA: Diagnosis present

## 2021-05-13 DIAGNOSIS — Z91010 Allergy to peanuts: Secondary | ICD-10-CM | POA: Insufficient documentation

## 2021-05-13 DIAGNOSIS — K219 Gastro-esophageal reflux disease without esophagitis: Secondary | ICD-10-CM | POA: Insufficient documentation

## 2021-05-13 LAB — COMPREHENSIVE METABOLIC PANEL
ALT: 9 U/L (ref 0–44)
AST: 14 U/L — ABNORMAL LOW (ref 15–41)
Albumin: 4 g/dL (ref 3.5–5.0)
Alkaline Phosphatase: 46 U/L (ref 38–126)
Anion gap: 6 (ref 5–15)
BUN: 14 mg/dL (ref 6–20)
CO2: 28 mmol/L (ref 22–32)
Calcium: 8.9 mg/dL (ref 8.9–10.3)
Chloride: 102 mmol/L (ref 98–111)
Creatinine, Ser: 0.79 mg/dL (ref 0.44–1.00)
GFR, Estimated: >60 mL/min (ref >60)
Glucose, Bld: 70 mg/dL (ref 70–99)
Potassium: 3.8 mmol/L (ref 3.5–5.1)
Sodium: 136 mmol/L (ref 135–145)
Total Bilirubin: 0.6 mg/dL (ref 0.3–1.2)
Total Protein: 7.5 g/dL (ref 6.5–8.1)

## 2021-05-13 LAB — URINALYSIS, COMPLETE (UACMP) WITH MICROSCOPIC
Bilirubin Urine: NEGATIVE
Glucose, UA: NEGATIVE mg/dL
Hgb urine dipstick: NEGATIVE
Ketones, ur: NEGATIVE mg/dL
Leukocytes,Ua: NEGATIVE
Nitrite: NEGATIVE
Protein, ur: NEGATIVE mg/dL
Specific Gravity, Urine: 1.003 — ABNORMAL LOW (ref 1.005–1.030)
pH: 7 (ref 5.0–8.0)

## 2021-05-13 LAB — CBC
HCT: 37.1 % (ref 36.0–46.0)
Hemoglobin: 12.4 g/dL (ref 12.0–15.0)
MCH: 25.9 pg — ABNORMAL LOW (ref 26.0–34.0)
MCHC: 33.4 g/dL (ref 30.0–36.0)
MCV: 77.5 fL — ABNORMAL LOW (ref 80.0–100.0)
Platelets: 214 10*3/uL (ref 150–400)
RBC: 4.79 MIL/uL (ref 3.87–5.11)
RDW: 14.9 % (ref 11.5–15.5)
WBC: 6.2 10*3/uL (ref 4.0–10.5)
nRBC: 0 % (ref 0.0–0.2)

## 2021-05-13 LAB — LIPASE, BLOOD: Lipase: 37 U/L (ref 11–51)
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
## 2021-05-13 NOTE — Discharge Instructions (Signed)
Your workup in the Emergency Department today was reassuring.  We did not find any specific abnormalities.  We recommend you drink plenty of fluids, take your regular medications and/or any new ones prescribed today, and follow up with the doctor(s) listed in these documents as recommended.  Return to the Emergency Department if you develop new or worsening symptoms that concern you.  

## 2021-05-15 ENCOUNTER — Telehealth: Payer: Self-pay
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
## 2021-05-16 NOTE — Telephone Encounter (Signed)
Left message at [facility] [address] awaiting response.

## 2021-05-17 ENCOUNTER — Ambulatory Visit: Payer: Self-pay | Admitting: *Deleted

## 2021-05-17 ENCOUNTER — Telehealth: Payer: Self-pay

## 2021-05-17 ENCOUNTER — Encounter: Payer: 59 | Admitting: *Deleted

## 2021-05-17 DIAGNOSIS — F430 Acute stress reaction: Secondary | ICD-10-CM

## 2021-05-17 DIAGNOSIS — F418 Other specified anxiety disorders: Secondary | ICD-10-CM

## 2021-05-17 NOTE — Patient Instructions (Signed)
Visit Information  Goals Addressed            This Visit's Progress   . Manage My Emotions       Timeframe:  Long-Range Goal Priority:  High Start Date:      05/26/21                       Expected End Date:     10/26/21                  Follow Up Date 05 25/2022   - begin personal counseling - practice relaxation or meditation daily - start or continue a personal journal - talk about feelings with a friend, family or spiritual advisor - practice positive thinking and self-talk  -referral made for ongoing counseling through [name]   Why is this important?    When you are stressed, down or upset, your body reacts too.   For example, your blood pressure may get higher; you may have a headache or stomachache.   When your emotions get the best of you, your body's ability to fight off cold and flu gets weak.   These steps will help you manage your emotions.     Notes:        The patient verbalized understanding of instructions, educational materials, and care plan provided today and declined offer to receive copy of patient instructions, educational materials, and care plan.   No further follow up required: patient to follow up with therapist referred to by [facility], LCSW Clinical Social Worker  [facility] Care Management [phone]

## 2021-05-17 NOTE — Progress Notes (Signed)
This encounter was created in error - please disregard.

## 2021-05-17 NOTE — Telephone Encounter (Signed)
Copied from CRM [id number]. Topic: General - Other >> May 17, 2021 10:09 AM Leafy Ro wrote: Reason for CRM: Pt is calling and would like crystal social worker to return her call. Pt did not want to elaborate the reason
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
## 2021-05-23 ENCOUNTER — Ambulatory Visit: Payer: Self-pay | Admitting: *Deleted

## 2021-05-23 DIAGNOSIS — F430 Acute stress reaction: Secondary | ICD-10-CM

## 2021-05-23 DIAGNOSIS — F418 Other specified anxiety disorders: Secondary | ICD-10-CM
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
## 2021-05-24 NOTE — Patient Instructions (Signed)
Visit Information  Goals Addressed            This Visit's Progress   . Manage My Emotions       Timeframe:  Long-Range Goal Priority:  High Start Date:      05/26/21                       Expected End Date:     10/26/21                  Follow Up Date 05/23/21   - begin personal counseling-05/29/21 - practice relaxation or meditation daily - start or continue a personal journal - talk about feelings with a friend, family or spiritual advisor - practice positive thinking and self-talk  -referral made for ongoing counseling through [name]-patient will however keep appointment with original therapist set up through [facility]   Why is this important?    When you are stressed, down or upset, your body reacts too.   For example, your blood pressure may get higher; you may have a headache or stomachache.   When your emotions get the best of you, your body's ability to fight off cold and flu gets weak.   These steps will help you manage your emotions.     Notes:        The patient verbalized understanding of instructions, educational materials, and care plan provided today and declined offer to receive copy of patient instructions, educational materials, and care plan.   No further follow up required: patient will follow up with [facility] on 05/29/21  [name], LCSW Clinical Social Worker  [facility] Care Management [phone]

## 2021-05-28 ENCOUNTER — Encounter: Payer: Self-pay | Admitting: Family Medicine

## 2021-05-29 ENCOUNTER — Ambulatory Visit (INDEPENDENT_AMBULATORY_CARE_PROVIDER_SITE_OTHER): Payer: 59 | Admitting: Unknown Physician Specialty

## 2021-05-29 ENCOUNTER — Other Ambulatory Visit: Payer: Self-pay

## 2021-05-29 ENCOUNTER — Encounter: Payer: Self-pay | Admitting: Unknown Physician Specialty

## 2021-05-29 VITALS — BP 110/64 | HR 100 | Temp 98.1°F | Resp 16 | Ht 64.0 in | Wt 125.6 lb

## 2021-05-29 DIAGNOSIS — R1012 Left upper quadrant pain: Secondary | ICD-10-CM | POA: Diagnosis not present
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
## 2021-05-30 ENCOUNTER — Encounter: Payer: Self-pay | Admitting: *Deleted

## 2021-05-30 ENCOUNTER — Other Ambulatory Visit: Payer: Self-pay

## 2021-05-30 ENCOUNTER — Emergency Department
Admission: EM | Admit: 2021-05-30 | Discharge: 2021-05-31 | Disposition: A | Discharge status: Home / Self Care | Payer: 59 | Attending: Emergency Medicine | Admitting: Emergency Medicine

## 2021-05-30 DIAGNOSIS — Z91040 Latex allergy status: Secondary | ICD-10-CM | POA: Insufficient documentation

## 2021-05-30 DIAGNOSIS — Z91010 Allergy to peanuts: Secondary | ICD-10-CM | POA: Insufficient documentation

## 2021-05-30 DIAGNOSIS — Z20822 Contact with and (suspected) exposure to covid-19: Secondary | ICD-10-CM | POA: Diagnosis not present

## 2021-05-30 DIAGNOSIS — J029 Acute pharyngitis, unspecified: Secondary | ICD-10-CM | POA: Diagnosis not present

## 2021-05-30 DIAGNOSIS — R002 Palpitations: Secondary | ICD-10-CM | POA: Insufficient documentation

## 2021-05-30 DIAGNOSIS — R070 Pain in throat: Secondary | ICD-10-CM | POA: Diagnosis present

## 2021-05-30 LAB — GROUP A STREP BY PCR: Group A Strep by PCR: NOT DETECTED

## 2021-05-30 LAB — RESP PANEL BY RT-PCR (FLU A&B, COVID) ARPGX2
Influenza A by PCR: NEGATIVE
Influenza B by PCR: NEGATIVE
SARS Coronavirus 2 by RT PCR: NEGATIVE

## 2021-05-30 MED ORDER — METHYLPREDNISOLONE SODIUM SUCC 125 MG IJ SOLR
125.0000 mg | Freq: Once | INTRAMUSCULAR | Status: AC
  Administered 2021-05-30: 125 mg via INTRAVENOUS
  Filled 2021-05-30: qty 2

## 2021-05-30 MED ORDER — SODIUM CHLORIDE 0.9 % IV BOLUS
1000.0000 mL | Freq: Once | INTRAVENOUS | Status: AC
  Administered 2021-05-30: 1000 mL via INTRAVENOUS
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
## 2021-05-30 NOTE — ED Notes (Signed)
Pt eating crackers and drinking ginger ale.

## 2021-05-30 NOTE — ED Notes (Signed)
Resumed care from zach rn.  Pt on cell phone in hallway bed.  Iv fluids infusing   No resp distress

## 2021-05-30 NOTE — ED Triage Notes (Signed)
Pt reports she ate a sweet potato and her throat started closing up. Pt used epi pen and took 1 bendryl   Pt feels jiittery now.  Pt alert  Speech clear. No rash noted  Ems on scene at home

## 2021-06-14 ENCOUNTER — Ambulatory Visit: Payer: Self-pay | Admitting: *Deleted

## 2021-06-14 NOTE — Telephone Encounter (Signed)
I returned pt's call.  Monday I did a rapid test and a PCR test.   The rapid test was negative and the PCR that I got back this morning is positive for Covid.   I did another home test and it was negative.  I had a rapid test done at the facility and it was negative. I wasn't sure which test was accurate out of the 3 rapid negative tests or the one positive PCR test.  My mom is positive for Covid and I've been around her but we have been in quarantine since last week since she was positive.  Pt denies any symptoms other than a headache that she gets from a neck injury which is not new for her.   No fever.   No URI symptoms when I asked her.  Since she has completed the 10 days of quarantine with her mother and does not have symptoms or had fever for 24 hrs without the use of medication I let her know she was clear from her quarantine period.    She thanked me for answering her many questions about testing and Covid.

## 2021-06-20 ENCOUNTER — Ambulatory Visit
Admission: RE | Admit: 2021-06-20 | Discharge: 2021-06-20 | Disposition: A | Discharge status: Home / Self Care | Payer: 59 | Source: Ambulatory Visit | Attending: Unknown Physician Specialty | Admitting: Unknown Physician Specialty

## 2021-06-20 ENCOUNTER — Encounter: Payer: Self-pay | Admitting: Unknown Physician Specialty

## 2021-06-20 ENCOUNTER — Other Ambulatory Visit: Payer: Self-pay

## 2021-06-20 DIAGNOSIS — R1012 Left upper quadrant pain: Secondary | ICD-10-CM

## 2021-06-20 IMAGING — US US ABDOMEN COMPLETE
1 series · 14 of 25 positions shown · non-contrast
Comparison: CT abdomen [DATE]

CLINICAL DATA: Abdomen pain

EXAM:
ABDOMEN ULTRASOUND COMPLETE

[Series 1: us abdomen complete · 0.15mm/px · 14 of 61 slices shown]
[im 1/61]
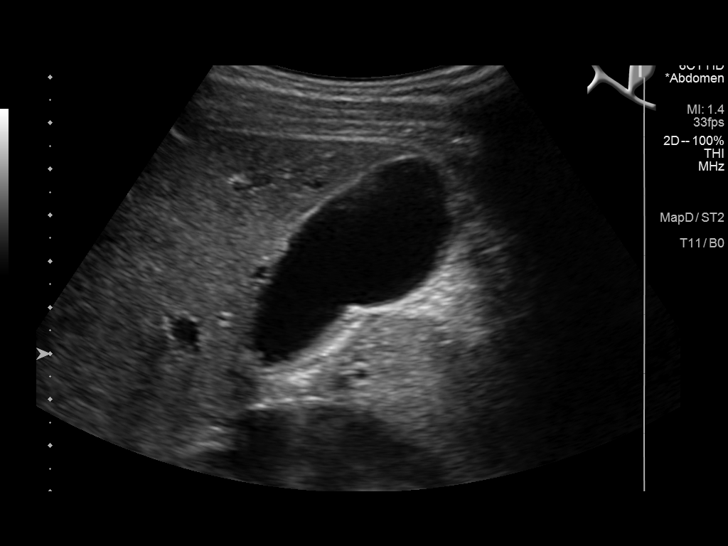
[im 6/61]
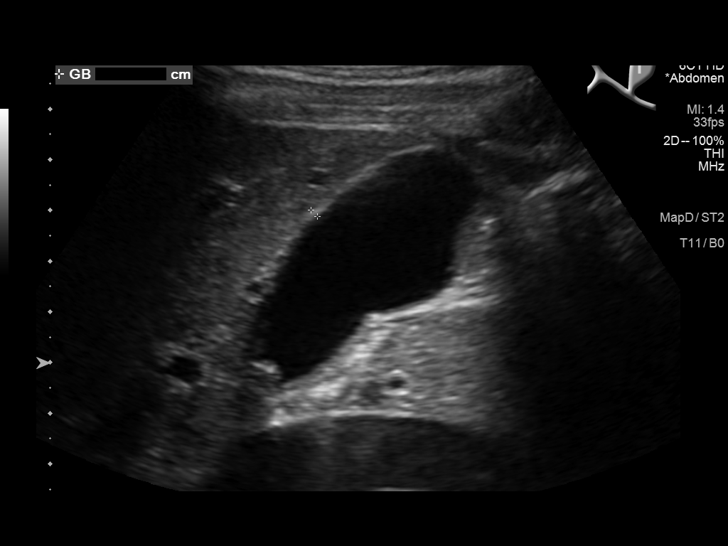
[im 11/61]
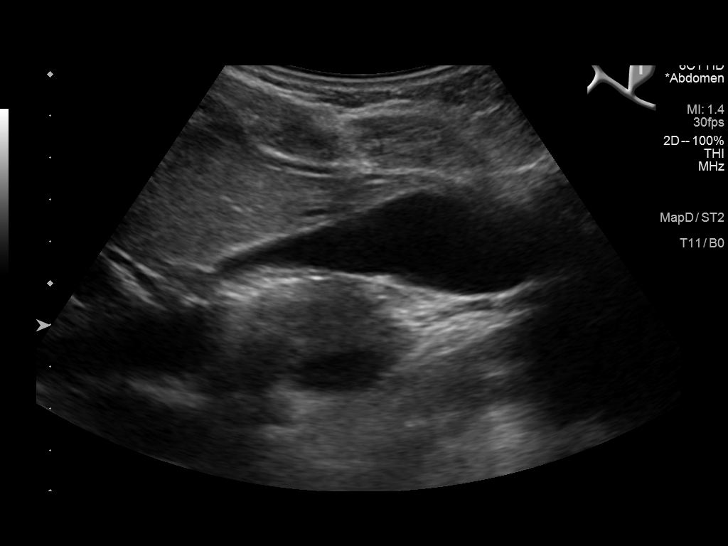
[im 16/61]
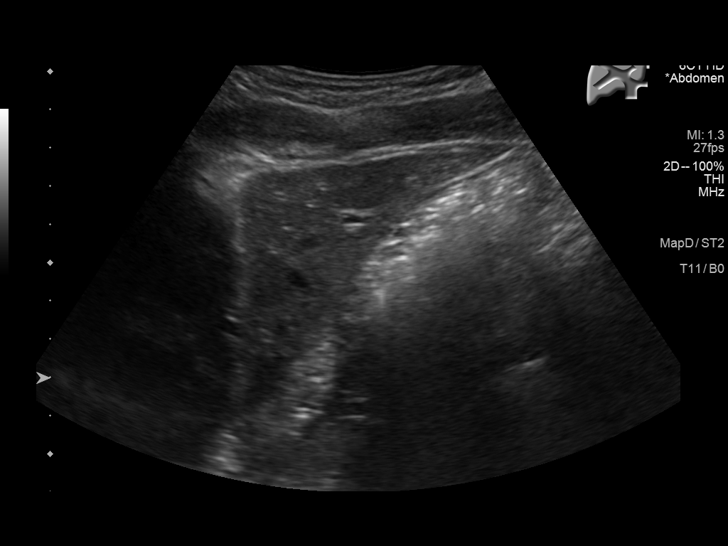
[im 21/61]
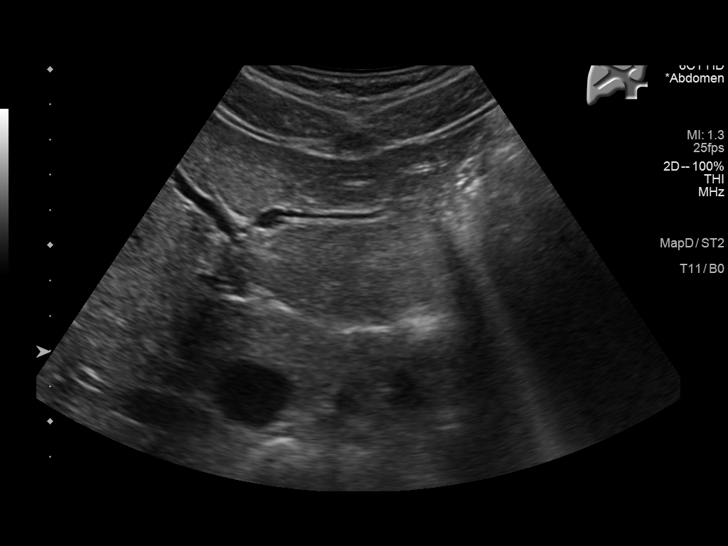
[im 23/61]
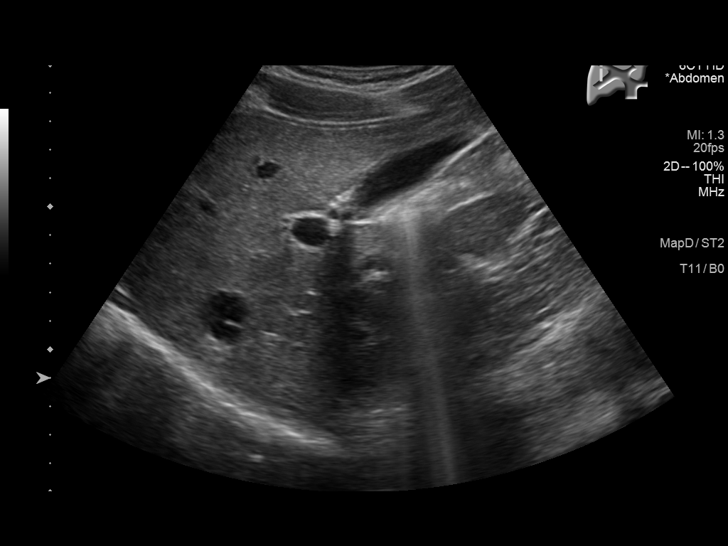
[im 28/61]
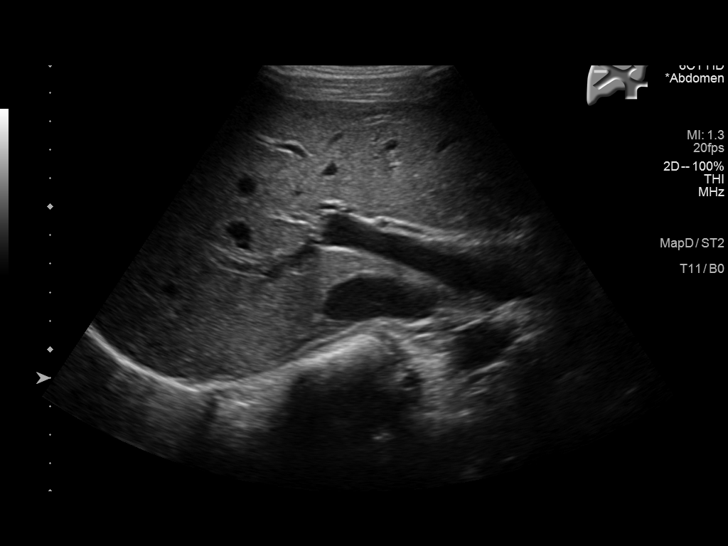
[im 33/61]
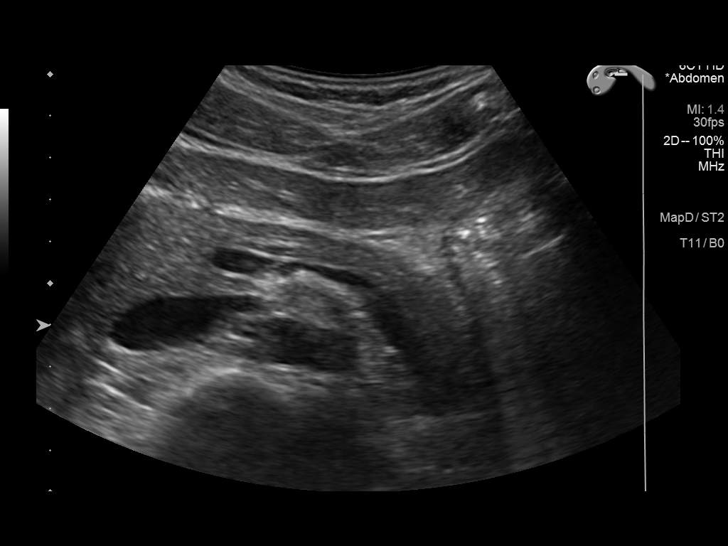
[im 38/61]
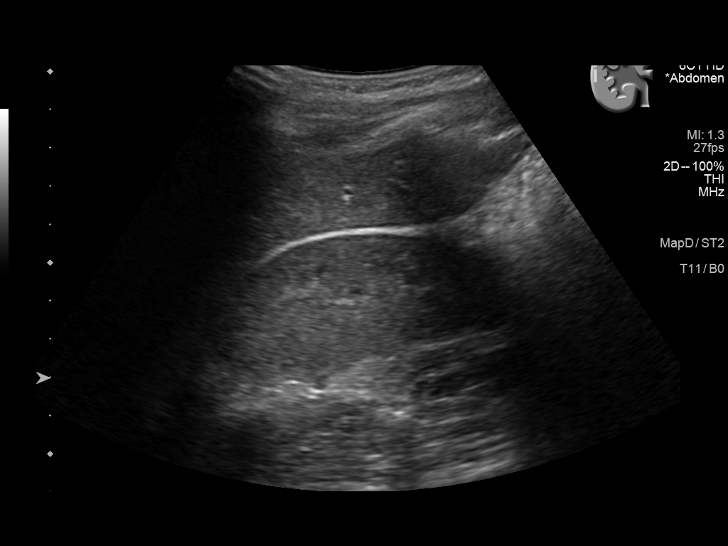
[im 41/61]
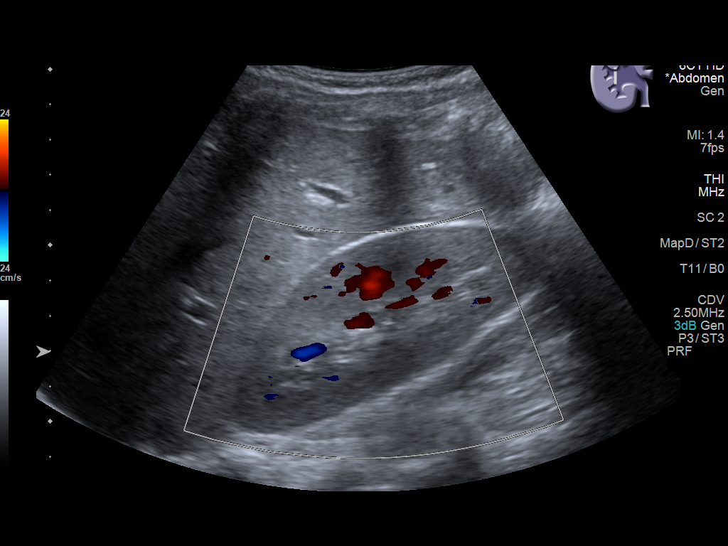
[im 46/61]
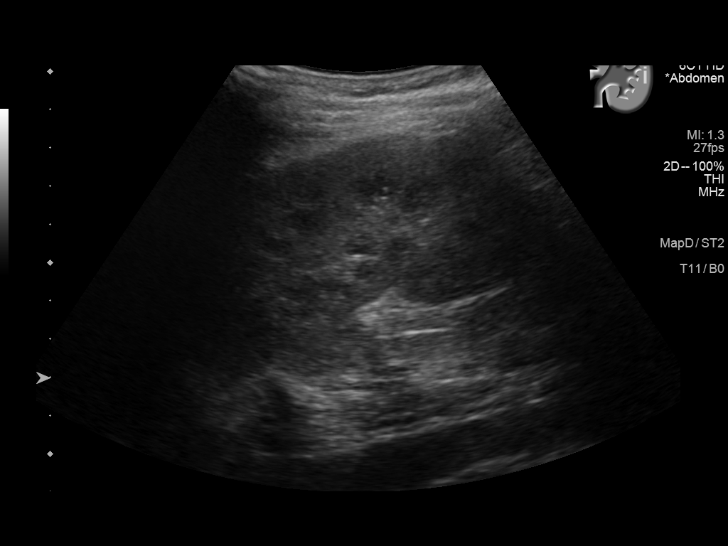
[im 51/61]
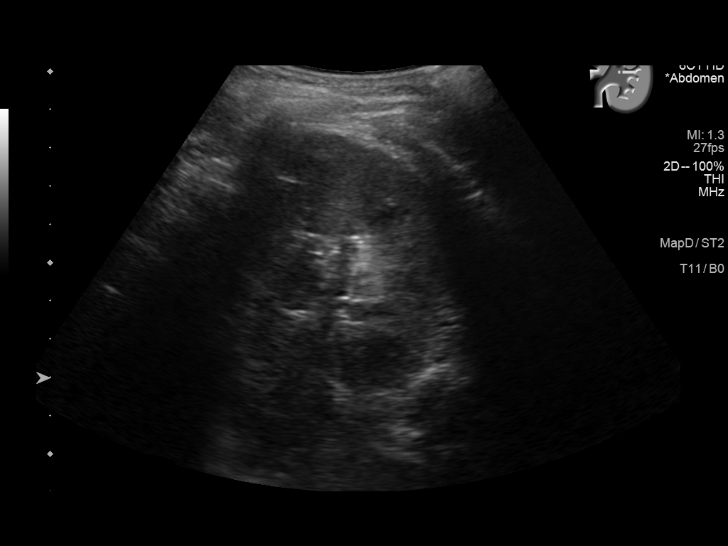
[im 56/61]
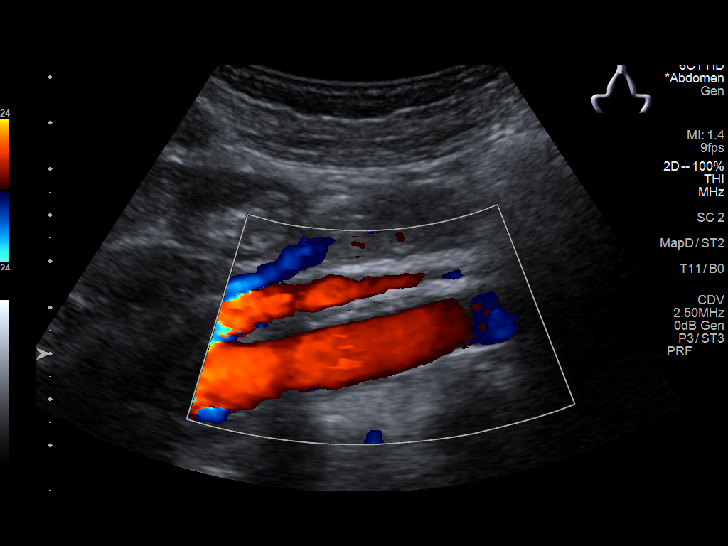
[im 61/61]
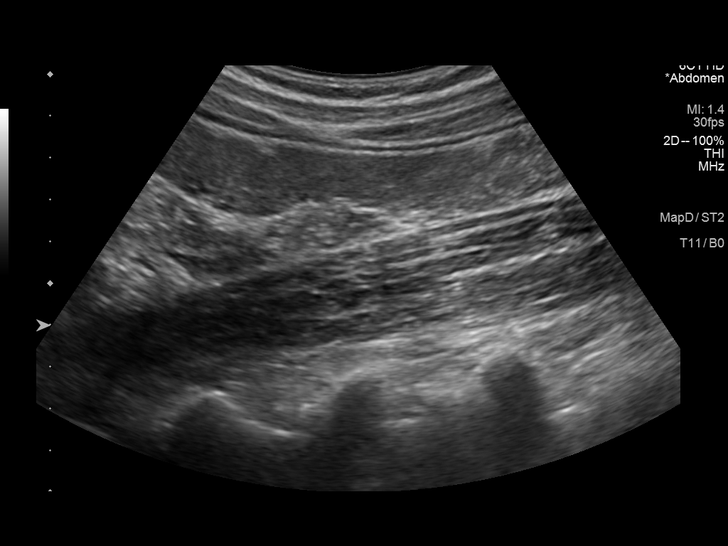

[14 of 25 positions shown; findings below may reference images not displayed]

FINDINGS: Gallbladder: Gallbladder polyp 5 mm. No gallstones or gallbladder
wall thickening. Negative sonographic Murphy sign. Gallbladder wall
1.8 mm, within normal limits

Common bile duct: Diameter: 4.2 mm

Liver: No focal lesion identified. Within normal limits in
parenchymal echogenicity. Portal vein is patent on color Doppler
imaging with normal direction of blood flow towards the liver.

IVC: No abnormality visualized.

Pancreas: Visualized portion unremarkable.

Spleen: Size and appearance within normal limits.

Right Kidney: Length: 10.2 cm. Echogenicity within normal limits. No
mass or hydronephrosis visualized.

Left Kidney: Length: 10.0 cm. Echogenicity within normal limits. No
mass or hydronephrosis visualized.

Abdominal aorta: No aneurysm visualized.

Other findings: None.
IMPRESSION: 5 mm gallbladder polyp.  Otherwise negative ultrasound abdomen.

## 2021-07-11 ENCOUNTER — Telehealth: Payer: Self-pay

## 2021-07-11 NOTE — Telephone Encounter (Signed)
Copied from CRM [id number]. Topic: General - Inquiry >> Jul 10, 2021  5:13 PM Jennifer Byrd D wrote: Reason for CRM: Pt would like to know if Dr. Carlynn Purl can write her to be exempt from getting the  Covid vaccine.  CB#  [phone]

## 2021-07-12 ENCOUNTER — Encounter: Payer: Self-pay | Admitting: Family Medicine

## 2021-07-12 NOTE — Telephone Encounter (Signed)
Pt notified that the letter cannot be written

## 2021-07-26 ENCOUNTER — Ambulatory Visit: Payer: Self-pay | Admitting: *Deleted

## 2021-07-26 ENCOUNTER — Other Ambulatory Visit: Payer: Self-pay | Admitting: Family Medicine

## 2021-07-26 DIAGNOSIS — E7800 Pure hypercholesterolemia, unspecified: Secondary | ICD-10-CM

## 2021-07-26 NOTE — Telephone Encounter (Signed)
Reason for Disposition  [1] Caller requesting NON-URGENT health information AND [2] PCP's office is the best resource  Answer Assessment - Initial Assessment Questions 1. REASON FOR CALL or QUESTION: "What is your reason for calling today?" or "How can I best help you?" or "What question do you have that I can help answer?"     I took my BP fasting 115/80.   124/85 a couple of days ago.   Does fasting make a difference?  I'm doing a training so every Wed. I have to weigh while fasting.    My BP machine at home says I have mild high BP.    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  I was just curious.  I'm building muscle with this training for the competition.      My last cholesterol number was high.   I was just wondering if I could come in and have my cholesterol checked.   I had my physical in April.  I just want to see how it's doing.  I let her know I would send a note to Dr. Carlynn Purl because she would need to put in an order in order for her to come in and have her blood work drawn.  She was agreeable to this plan.  Protocols used: Information Only Call - No Triage-A-AH

## 2021-07-26 NOTE — Telephone Encounter (Signed)
I returned pt's call.   She is requesting to have her cholesterol panel rechecked because her cholesterol and LDL were elevated during her April 2022 physical.     She also asked if her BP readings would be different when she is fasting versus when she is not fasting.    She is training for a body fitness competition is the reason she has these questions.    Her BP machine at home says she has mild hypertension when her bottom number is over 80.   She also checks her BP at the training facility on Wed mornings when she is fasting as part of the training.   Her BP this morning was 115/80.   In the dr's office the other day she said it was 120/70.    I let her know her BP readings were good.  She is not on any medications for BP or cholesterol.  She can be reached regarding the labs at [phone].

## 2021-07-27 NOTE — Telephone Encounter (Signed)
Pt aware.

## 2021-07-31 ENCOUNTER — Ambulatory Visit: Payer: Self-pay | Admitting: *Deleted

## 2021-07-31 ENCOUNTER — Ambulatory Visit (INDEPENDENT_AMBULATORY_CARE_PROVIDER_SITE_OTHER): Payer: 59 | Admitting: Family Medicine

## 2021-07-31 ENCOUNTER — Encounter: Payer: Self-pay | Admitting: Family Medicine

## 2021-07-31 ENCOUNTER — Other Ambulatory Visit: Payer: Self-pay

## 2021-07-31 VITALS — BP 130/80 | HR 83 | Temp 98.0°F | Resp 16 | Wt 126.4 lb

## 2021-07-31 DIAGNOSIS — M542 Cervicalgia: Secondary | ICD-10-CM | POA: Diagnosis not present

## 2021-07-31 DIAGNOSIS — R519 Headache, unspecified: Secondary | ICD-10-CM

## 2021-07-31 LAB — LIPID PANEL
Cholesterol: 166 mg/dL (ref <200)
HDL: 57 mg/dL (ref >=50)
LDL Cholesterol (Calc): 95 mg/dL (calc)
Non-HDL Cholesterol (Calc): 109 mg/dL (calc) (ref <130)
Total CHOL/HDL Ratio: 2.9 (calc) (ref <5.0)
Triglycerides: 49 mg/dL (ref <150)
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
## 2021-07-31 NOTE — Telephone Encounter (Signed)
Pt called in c/o head pain that started Tuesday .   She is working out and exercised her shoulders at the first of the week and she's wondering if that is contributing to her having a headache.   It's been constant for 4 days.  See triage notes.

## 2021-07-31 NOTE — Patient Instructions (Signed)
It was great to see you!  Our plans for today:  - Call Neurology for a follow up appointment [phone].  - Someone will call you about a physical therapy appointment.  - You can continue to take tylenol, ibuprofen, cyclobenzaprine as needed for pain. Heat and massage will likely help also.  Take care and seek immediate care sooner if you develop any concerns.   Dr. Linwood Dibbles

## 2021-08-04 ENCOUNTER — Other Ambulatory Visit: Payer: Self-pay | Admitting: Physician Assistant

## 2021-08-04 DIAGNOSIS — R519 Headache, unspecified: Secondary | ICD-10-CM

## 2021-08-04 DIAGNOSIS — F0781 Postconcussional syndrome: Secondary | ICD-10-CM

## 2021-08-04 DIAGNOSIS — R42 Dizziness and giddiness: Secondary | ICD-10-CM

## 2021-08-16 ENCOUNTER — Ambulatory Visit: Payer: 59

## 2021-08-17 ENCOUNTER — Ambulatory Visit
Admission: RE | Admit: 2021-08-17 | Discharge: 2021-08-17 | Disposition: A | Discharge status: Home / Self Care | Payer: 59 | Source: Ambulatory Visit | Attending: Physician Assistant | Admitting: Physician Assistant

## 2021-08-17 ENCOUNTER — Other Ambulatory Visit: Payer: Self-pay

## 2021-08-17 ENCOUNTER — Other Ambulatory Visit: Payer: Self-pay | Admitting: Physician Assistant

## 2021-08-17 ENCOUNTER — Encounter: Payer: Self-pay | Admitting: Oncology

## 2021-08-17 DIAGNOSIS — R42 Dizziness and giddiness: Secondary | ICD-10-CM

## 2021-08-17 DIAGNOSIS — F0781 Postconcussional syndrome: Secondary | ICD-10-CM | POA: Insufficient documentation

## 2021-08-17 DIAGNOSIS — R519 Headache, unspecified: Secondary | ICD-10-CM | POA: Diagnosis present
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
## 2021-08-17 MED ORDER — GADOBUTROL 1 MMOL/ML IV SOLN: 5.0000 mL | Freq: Once | INTRAVENOUS | Status: DC | PRN

## 2021-10-17 LAB — HM PAP SMEAR: HM Pap smear: NORMAL

## 2021-10-21 ENCOUNTER — Ambulatory Visit: Admit: 2021-10-21 | Discharge status: Absent without leave | Payer: 59

## 2021-10-23 ENCOUNTER — Ambulatory Visit: Payer: Self-pay | Admitting: *Deleted

## 2021-10-23 NOTE — Telephone Encounter (Signed)
Pt called in c/o having right upper abd pain for the last 5 days.   She has IBS but this feels different.   The pain is higher and the IBS pain is usually lower.   She went to the urgent care yesterday at [facility] and had bloodwork done and urine checked.   Everything was fine.  She is scheduled to see Dr. Linwood Dibbles 10/24/2021 at 8:20 so that appt fits into the protocol guidelines so will keep it.    See triage notes.  I went over the care advice with her.  I sent my notes to [facility].

## 2021-10-23 NOTE — Telephone Encounter (Signed)
Just FYI.

## 2021-10-23 NOTE — Telephone Encounter (Signed)
Reason for Disposition  [1] MODERATE pain (e.g., interferes with normal activities) AND [2] pain comes and goes (cramps) AND [3] present > 24 hours  (Exception: pain with Vomiting or Diarrhea - see that Guideline)  Answer Assessment - Initial Assessment Questions 1. LOCATION: "Where does it hurt?"      Pt calling in c/o abd pain for 5 days.   I went to the urgent care and they did blood work and all.   Thought maybe IBS.   2. RADIATION: "Does the pain shoot anywhere else?" (e.g., chest, back)     It's on the right side of my stomach and under my rib cage.   It's a cramp that goes up.    When I touch the right upper area under my rib cage it's tender. I had a loose stool yesterday.   I told the provider at the urgent care yesterday at the [facility] that stools are light brown yesterday with specks of red in it.     3. ONSET: "When did the pain begin?" (e.g., minutes, hours or days ago)      Wed. Last week. 4. SUDDEN: "Gradual or sudden onset?"     It started in the morning with waves of cramps with waves moving up.   Then it started hurting me at night time then yesterday I went to the urgent care because it was hurting worse.    It's worse in the mornings when I get up and move around.    5. PATTERN "Does the pain come and go, or is it constant?"    - If constant: "Is it getting better, staying the same, or worsening?"      (Note: Constant means the pain never goes away completely; most serious pain is constant and it progresses)     - If intermittent: "How long does it last?" "Do you have pain now?"     (Note: Intermittent means the pain goes away completely between bouts)     I couldn't eat breakfast yesterday due to cramping.    I ate at 2:00 PM but I started having cramps.  6. SEVERITY: "How bad is the pain?"  (e.g., Scale 1-10; mild, moderate, or severe)   - MILD (1-3): doesn't interfere with normal activities, abdomen soft and not tender to touch    - MODERATE (4-7): interferes with  normal activities or awakens from sleep, abdomen tender to touch    - SEVERE (8-10): excruciating pain, doubled over, unable to do any normal activities      Cramping is a 5/10.    Rubbing the area makes it worse.   Laying down on my left side eases the pain on my right side.   I don't know if it's a muscle spasm or not.    I've been dancing for 2 wks and doing dance techniques but nothing out of the ordinary.     7. RECURRENT SYMPTOM: "Have you ever had this type of stomach pain before?" If Yes, ask: "When was the last time?" and "What happened that time?"      No 8. CAUSE: "What do you think is causing the stomach pain?"     Maybe a muscle sprain, I don't really know. 9. RELIEVING/AGGRAVATING FACTORS: "What makes it better or worse?" (e.g., movement, antacids, bowel movement)     Laying on left side makes the pain ease up. 10. OTHER SYMPTOMS: "Do you have any other symptoms?" (e.g., back pain, diarrhea, fever, urination pain, vomiting)  No urinary symptoms.   They checked my urine at the urgent care but didn't say anything about it.   11. PREGNANCY: "Is there any chance you are pregnant?" "When was your last menstrual period?"       No   I had a yearly check up last month.  Protocols used: Abdominal Pain - Female-[facility]

## 2021-10-24 ENCOUNTER — Encounter: Payer: Self-pay | Admitting: Family Medicine

## 2021-10-24 ENCOUNTER — Other Ambulatory Visit: Payer: Self-pay

## 2021-10-24 ENCOUNTER — Ambulatory Visit (INDEPENDENT_AMBULATORY_CARE_PROVIDER_SITE_OTHER): Payer: 59 | Admitting: Family Medicine

## 2021-10-24 ENCOUNTER — Ambulatory Visit: Payer: Self-pay | Admitting: *Deleted

## 2021-10-24 VITALS — BP 116/72 | HR 66 | Temp 97.7°F | Resp 16 | Ht 64.0 in | Wt 131.0 lb

## 2021-10-24 DIAGNOSIS — K581 Irritable bowel syndrome with constipation: Secondary | ICD-10-CM | POA: Diagnosis not present

## 2021-10-24 NOTE — Assessment & Plan Note (Signed)
With constipation. Lab and imaging w/u reassuring. Increase fiber in diet, information on high fiber diet given. Recommend miralax, titrate for effect. F/u prn.

## 2021-10-24 NOTE — Patient Instructions (Signed)
It was great to see you!  Our plans for today:  - Take miralax daily until you have regular smooth bowel movements.  - Continue to take your fiber supplement. See below for tips on a high fiber diet.   Take care and seek immediate care sooner if you develop any concerns.   Dr. Linwood Dibbles  High-Fiber Eating Plan Fiber, also called dietary fiber, is a type of carbohydrate. It is found foods such as fruits, vegetables, whole grains, and beans. A high-fiber diet can have many health benefits. Your health care provider may recommend a high-fiber diet to help: Prevent constipation. Fiber can make your bowel movements more regular. Lower your cholesterol. Relieve the following conditions: Inflammation of veins in the anus (hemorrhoids). Inflammation of specific areas of the digestive tract (uncomplicated diverticulosis). A problem of the large intestine, also called the colon, that sometimes causes pain and diarrhea (irritable bowel syndrome, or IBS). Prevent overeating as part of a weight-loss plan. Prevent heart disease, type 2 diabetes, and certain cancers. What are tips for following this plan? Reading food labels  Check the nutrition facts label on food products for the amount of dietary fiber. Choose foods that have 5 grams of fiber or more per serving. The goals for recommended daily fiber intake include: Men (age 46 or younger): 34-38 g. Men (over age 62): 28-34 g. Women (age 35 or younger): 25-28 g. Women (over age 3): 22-25 g. Your daily fiber goal is _____________ g. Shopping Choose whole fruits and vegetables instead of processed forms, such as apple juice or applesauce. Choose a wide variety of high-fiber foods such as avocados, lentils, oats, and kidney beans. Read the nutrition facts label of the foods you choose. Be aware of foods with added fiber. These foods often have high sugar and sodium amounts per serving. Cooking Use whole-grain flour for baking and cooking. Cook  with brown rice instead of white rice. Meal planning Start the day with a breakfast that is high in fiber, such as a cereal that contains 5 g of fiber or more per serving. Eat breads and cereals that are made with whole-grain flour instead of refined flour or white flour. Eat brown rice, bulgur wheat, or millet instead of white rice. Use beans in place of meat in soups, salads, and pasta dishes. Be sure that half of the grains you eat each day are whole grains. General information You can get the recommended daily intake of dietary fiber by: Eating a variety of fruits, vegetables, grains, nuts, and beans. Taking a fiber supplement if you are not able to take in enough fiber in your diet. It is better to get fiber through food than from a supplement. Gradually increase how much fiber you consume. If you increase your intake of dietary fiber too quickly, you may have bloating, cramping, or gas. Drink plenty of water to help you digest fiber. Choose high-fiber snacks, such as berries, raw vegetables, nuts, and popcorn. What foods should I eat? Fruits Berries. Pears. Apples. Oranges. Avocado. Prunes and raisins. Dried figs. Vegetables Sweet potatoes. Spinach. Kale. Artichokes. Cabbage. Broccoli. Cauliflower. Green peas. Carrots. Squash. Grains Whole-grain breads. Multigrain cereal. Oats and oatmeal. Brown rice. Barley. Bulgur wheat. Millet. Quinoa. Bran muffins. Popcorn. Rye wafer crackers. Meats and other proteins Navy beans, kidney beans, and pinto beans. Soybeans. Split peas. Lentils. Nuts and seeds. Dairy Fiber-fortified yogurt. Beverages Fiber-fortified soy milk. Fiber-fortified orange juice. Other foods Fiber bars. The items listed above may not be a complete list of recommended  foods and beverages. Contact a dietitian for more information. What foods should I avoid? Fruits Fruit juice. Cooked, strained fruit. Vegetables Fried potatoes. Canned vegetables. Well-cooked  vegetables. Grains White bread. Pasta made with refined flour. White rice. Meats and other proteins Fatty cuts of meat. Fried chicken or fried fish. Dairy Milk. Yogurt. Cream cheese. Sour cream. Fats and oils Butters. Beverages Soft drinks. Other foods Cakes and pastries. The items listed above may not be a complete list of foods and beverages to avoid. Talk with your dietitian about what choices are best for you. Summary Fiber is a type of carbohydrate. It is found in foods such as fruits, vegetables, whole grains, and beans. A high-fiber diet has many benefits. It can help to prevent constipation, lower blood cholesterol, aid weight loss, and reduce your risk of heart disease, diabetes, and certain cancers. Increase your intake of fiber gradually. Increasing fiber too quickly may cause cramping, bloating, and gas. Drink plenty of water while you increase the amount of fiber you consume. The best sources of fiber include whole fruits and vegetables, whole grains, nuts, seeds, and beans. This information is not intended to replace advice given to you by your health care provider. Make sure you discuss any questions you have with your health care provider. Document Revised: 04/14/2020 Document Reviewed: 04/14/2020 Elsevier Patient Education  2022 [employer].

## 2021-10-24 NOTE — Telephone Encounter (Signed)
Reason for Disposition  [1] MILD pain (e.g., does not interfere with normal activities) AND [2] pain comes and goes (cramps) AND [3] present > 48 hours  (Exception: this same abdominal pain is a chronic symptom recurrent or ongoing AND present > 4 weeks)  Answer Assessment - Initial Assessment Questions 1. LOCATION: "Where does it hurt?"      Low abdominal area  2. RADIATION: "Does the pain shoot anywhere else?" (e.g., chest, back)     na 3. ONSET: "When did the pain begin?" (e.g., minutes, hours or days ago)      1 week ago  4. SUDDEN: "Gradual or sudden onset?"     C/o constipation  5. PATTERN "Does the pain come and go, or is it constant?"    - If constant: "Is it getting better, staying the same, or worsening?"      (Note: Constant means the pain never goes away completely; most serious pain is constant and it progresses)     - If intermittent: "How long does it last?" "Do you have pain now?"     (Note: Intermittent means the pain goes away completely between bouts)     Comes and goes  6. SEVERITY: "How bad is the pain?"  (e.g., Scale 1-10; mild, moderate, or severe)   - MILD (1-3): doesn't interfere with normal activities, abdomen soft and not tender to touch    - MODERATE (4-7): interferes with normal activities or awakens from sleep, abdomen tender to touch    - SEVERE (8-10): excruciating pain, doubled over, unable to do any normal activities      Moderate right side pain, awakens from sleep  7. RECURRENT SYMPTOM: "Have you ever had this type of stomach pain before?" If Yes, ask: "When was the last time?" and "What happened that time?"      Yes  8. CAUSE: "What do you think is causing the stomach pain?"     Not sure IBS 9. RELIEVING/AGGRAVATING FACTORS: "What makes it better or worse?" (e.g., movement, antacids, bowel movement)     Na  10. OTHER SYMPTOMS: "Do you have any other symptoms?" (e.g., back pain, diarrhea, fever, urination pain, vomiting)       Constipation , low abd.  Pain right lower area, bloated  11. PREGNANCY: "Is there any chance you are pregnant?" "When was your last menstrual period?"       na  Protocols used: Abdominal Pain - [facility]

## 2021-10-24 NOTE — Progress Notes (Signed)
   SUBJECTIVE:   CHIEF COMPLAINT / HPI:   ABDOMINAL ISSUES - seen previously 10/30 for same. CBC, CMP, amylase, lipase, UA unremarkable with benign abdominal exam. - h/o IBS, not currently on medication. Not usually in UQ and not this long usually. - has IUD in place - Was in [place] last week and diet was off, ate more fried foods. Duration: 5 days Nature: cramping Location: RUQ, RLQ Radiation: yes, up R sided ribs Episode duration: Frequency: intermittent Alleviating factors: Aggravating factors: Treatments attempted: benefiber Constipation: yes Diarrhea: no Episodes of diarrhea/day: n/a Mucous in the stool: no Heartburn: no Bloating:yes Flatulence: no Nausea: no Vomiting: no Episodes of vomit/day: Melena or hematochezia: some red specks in stool Rash: no Jaundice: no Fever: no Weight loss: thinks she may have lost some, not sure    OBJECTIVE:   BP 116/72   Pulse 66   Temp 97.7 F (36.5 C)   Resp 16   Ht 5\' 4"  (1.626 m)   Wt 131 lb (59.4 kg)   SpO2 99%   BMI 22.49 kg/m   Gen: well appearing, in NAD Card: RRR Lungs: CTAB Abd: soft, +BS, TTP in RUQ and RLQ. No rebound tenderness or guarding. Negative Murphy sign.  Ext: WWP, no edema  Limited of abdomen with normal appearing liver, bilateral kidneys. Normal appearing gallbladder without stones or evidence of cholecystitis.   ASSESSMENT/PLAN:   Irritable bowel syndrome With constipation. Lab and imaging w/u reassuring. Increase fiber in diet, information on high fiber diet given. Recommend miralax, titrate for effect. F/u prn.     [place], DO

## 2021-10-24 NOTE — Telephone Encounter (Signed)
The patient has called to share that they are continuing to experience stomach discomfort   The patient shares that they have not had any diarrhea or nausea   Called patient to review symptoms. Patient was seen today and 10/30 at [facility] for low right abdominal issues. C/o discomfort comes and goes, bloating in am and worsening since Sunday. Reports abd discomfort is worse in early am and late at night. Awakened by discomfort. C/o stool is light brown in color and she has noted "red speaks" in stool and after wiping. Denies fever, N/V. Patient requesting to submit stool sample and is concerned regarding stool color. Denies blood or clots noted in commode or after wiping for hygiene. Please advise . Patient reports she can pick up stool sample kit and return in afternoon. Care advise given. Patient verbalized understanding of care advise and to call back or go to [facility] or ED if symptoms worsen.

## 2021-10-25 ENCOUNTER — Other Ambulatory Visit: Payer: Self-pay | Admitting: Family Medicine

## 2021-10-25 ENCOUNTER — Other Ambulatory Visit: Payer: Self-pay

## 2021-10-25 DIAGNOSIS — K5900 Constipation, unspecified: Secondary | ICD-10-CM

## 2021-10-25 DIAGNOSIS — K581 Irritable bowel syndrome with constipation: Secondary | ICD-10-CM

## 2021-10-25 NOTE — Telephone Encounter (Signed)
Left detailed vm °

## 2021-10-25 NOTE — Addendum Note (Signed)
Addended by: Caro Laroche on: 10/25/2021 04:01 PM   Modules accepted: Orders

## 2021-10-30 LAB — GASTROINTESTINAL PATHOGEN PANEL PCR
C. difficile Tox A/B, PCR: NOT DETECTED
Campylobacter, PCR: NOT DETECTED
Cryptosporidium, PCR: NOT DETECTED
E coli (ETEC) LT/ST PCR: NOT DETECTED
E coli (STEC) stx1/stx2, PCR: NOT DETECTED
E coli 0157, PCR: NOT DETECTED
Giardia lamblia, PCR: NOT DETECTED
Norovirus, PCR: NOT DETECTED
Rotavirus A, PCR: NOT DETECTED
Salmonella, PCR: NOT DETECTED
Shigella, PCR: NOT DETECTED

## 2021-10-30 LAB — CELIAC DISEASE COMPREHENSIVE PANEL WITH REFLEXES
(tTG) Ab, IgA: <1 U/mL
Immunoglobulin A: 276 mg/dL (ref 47–310)

## 2021-11-01 ENCOUNTER — Encounter: Payer: Self-pay | Admitting: Family Medicine

## 2021-11-10 ENCOUNTER — Ambulatory Visit: Payer: Self-pay | Admitting: *Deleted

## 2021-11-10 DIAGNOSIS — F430 Acute stress reaction: Secondary | ICD-10-CM

## 2021-11-10 DIAGNOSIS — F418 Other specified anxiety disorders: Secondary | ICD-10-CM
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
## 2021-11-27 ENCOUNTER — Ambulatory Visit: Payer: Self-pay | Admitting: *Deleted

## 2021-11-27 DIAGNOSIS — F430 Acute stress reaction: Secondary | ICD-10-CM

## 2021-11-27 DIAGNOSIS — F418 Other specified anxiety disorders: Secondary | ICD-10-CM
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
## 2021-12-02 ENCOUNTER — Emergency Department
Admission: EM | Admit: 2021-12-02 | Discharge: 2021-12-02 | Disposition: A | Discharge status: Home / Self Care | Payer: 59 | Attending: Emergency Medicine | Admitting: Emergency Medicine

## 2021-12-02 ENCOUNTER — Other Ambulatory Visit: Payer: Self-pay

## 2021-12-02 ENCOUNTER — Encounter: Payer: Self-pay | Admitting: Oncology

## 2021-12-02 ENCOUNTER — Emergency Department: Payer: 59

## 2021-12-02 DIAGNOSIS — R519 Headache, unspecified: Secondary | ICD-10-CM | POA: Insufficient documentation

## 2021-12-02 LAB — URINALYSIS, COMPLETE (UACMP) WITH MICROSCOPIC
Bacteria, UA: NONE SEEN
Bilirubin Urine: NEGATIVE
Glucose, UA: NEGATIVE mg/dL
Hgb urine dipstick: NEGATIVE
Ketones, ur: NEGATIVE mg/dL
Leukocytes,Ua: NEGATIVE
Nitrite: NEGATIVE
Protein, ur: NEGATIVE mg/dL
Specific Gravity, Urine: 1.006 (ref 1.005–1.030)
pH: 5 (ref 5.0–8.0)

## 2021-12-02 LAB — CBC
HCT: 39.4 % (ref 36.0–46.0)
Hemoglobin: 13 g/dL (ref 12.0–15.0)
MCH: 25.2 pg — ABNORMAL LOW (ref 26.0–34.0)
MCHC: 33 g/dL (ref 30.0–36.0)
MCV: 76.5 fL — ABNORMAL LOW (ref 80.0–100.0)
Platelets: 239 10*3/uL (ref 150–400)
RBC: 5.15 MIL/uL — ABNORMAL HIGH (ref 3.87–5.11)
RDW: 15.8 % — ABNORMAL HIGH (ref 11.5–15.5)
WBC: 4.4 10*3/uL (ref 4.0–10.5)
nRBC: 0 % (ref 0.0–0.2)

## 2021-12-02 LAB — BASIC METABOLIC PANEL
Anion gap: 6 (ref 5–15)
BUN: 16 mg/dL (ref 6–20)
CO2: 27 mmol/L (ref 22–32)
Calcium: 8.9 mg/dL (ref 8.9–10.3)
Chloride: 104 mmol/L (ref 98–111)
Creatinine, Ser: 0.78 mg/dL (ref 0.44–1.00)
GFR, Estimated: >60 mL/min (ref >60)
Glucose, Bld: 106 mg/dL — ABNORMAL HIGH (ref 70–99)
Potassium: 3.6 mmol/L (ref 3.5–5.1)
Sodium: 137 mmol/L (ref 135–145)

## 2021-12-02 LAB — POC URINE PREG, ED: Preg Test, Ur: NEGATIVE

## 2021-12-02 NOTE — ED Triage Notes (Signed)
Pt c/o headache that started 20 minutes ago that she describes as a throbbing sensation on the right side of her head. Pt denies blurry/double vision or weakness. Pt has hx of concussions.
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
## 2021-12-12 DIAGNOSIS — Z20822 Contact with and (suspected) exposure to covid-19: Secondary | ICD-10-CM | POA: Diagnosis not present

## 2021-12-18 ENCOUNTER — Encounter: Payer: Self-pay | Admitting: Family Medicine

## 2021-12-19 ENCOUNTER — Telehealth: Payer: Self-pay | Admitting: Family Medicine

## 2021-12-19 NOTE — Telephone Encounter (Signed)
Copied from CRM [id number]. Topic: General - Other >> Dec 19, 2021  1:08 PM Fields, Hospital doctor R wrote: Reason for CRM: pt calling to see if there's and earlier appt than 2:20 tomorrow with dr. Carlynn Purl, she was talking to casandra and wanted to follow up with her, requesting a call back at [phone].    Called patient to inform her that Dr. Carlynn Purl did not have any earlier appointments available on tomorrow, 12/20/2021 but unable to leave a voice message due to mailbox being full.

## 2021-12-19 NOTE — Telephone Encounter (Signed)
Patient returning call to see if can get an earlier appt today, because afternoon is in the middle okf her shift. Please call back
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
## 2021-12-20 ENCOUNTER — Ambulatory Visit (INDEPENDENT_AMBULATORY_CARE_PROVIDER_SITE_OTHER): Payer: 59 | Admitting: Family Medicine

## 2021-12-20 ENCOUNTER — Encounter: Payer: Self-pay | Admitting: Family Medicine

## 2021-12-20 VITALS — BP 112/78 | HR 62 | Resp 16 | Ht 64.0 in | Wt 135.0 lb

## 2021-12-20 DIAGNOSIS — G253 Myoclonus: Secondary | ICD-10-CM | POA: Diagnosis not present

## 2021-12-20 DIAGNOSIS — F330 Major depressive disorder, recurrent, mild: Secondary | ICD-10-CM

## 2021-12-21 ENCOUNTER — Encounter: Payer: Self-pay | Admitting: Oncology

## 2021-12-28 ENCOUNTER — Encounter: Payer: Self-pay | Admitting: Oncology

## 2021-12-28 ENCOUNTER — Ambulatory Visit: Payer: Self-pay | Admitting: *Deleted

## 2021-12-28 NOTE — Telephone Encounter (Signed)
Patient states eye twitching has gotten worse since 12/20/2021 and unsure if she should ignore the symptoms      Called patient to review symptoms or eye twitching. No answer, mailbox full unable to leave message .

## 2021-12-28 NOTE — Telephone Encounter (Signed)
°  Chief Complaint: left eyelid twitching Symptoms: left eye twitching, dryness , some blurred vision at times  Frequency: x 1 month becoming more constant  Pertinent Negatives: Patient denies pain, drainage from eye, no other area of face twitching noted Disposition: [] ED /[] Urgent Care (no appt availability in office) / [x] Appointment(In office/virtual)/ []  [facility]/ [] Home Care/ [] Refused Recommended Disposition /[] [facility]/ []  Follow-up with PCP Additional Notes:  Appt scheduled for 01/04/22. Please advise if earlier appt available .       Reason for Disposition  [1] Eyelid twitches are a chronic symptom (recurrent or ongoing) AND [2] present > 4 weeks    Present x 1 month  Answer Assessment - Initial Assessment Questions 1. ONSET: "When did the pain start?" (e.g., minutes, hours, days)     Eye twitching started approx 1 month ago  2. TIMING: "Does the pain come and go, or has it been constant since it started?" (e.g., constant, intermittent, fleeting)     Becoming more constant  3. SEVERITY: "How bad is the pain?"   (Scale 1-10; mild, moderate or severe)   - MILD (1-3): doesn't interfere with normal activities    - MODERATE (4-7): interferes with normal activities or awakens from sleep    - SEVERE (8-10): excruciating pain and patient unable to do normal activities     na 4. LOCATION: "Where does it hurt?"  (e.g., eyelid, eye, cheekbone)     Left eye twitching  5. CAUSE: "What do you think is causing the pain?"     *No Answer* 6. VISION: "Do you have blurred vision or changes in your vision?"      *No Answer* 7. EYE DISCHARGE: "Is there any discharge (pus) from the eye(s)?"  If yes, ask: "What color is it?"      *No Answer* 8. FEVER: "Do you have a fever?" If Yes, ask: "What is it, how was it measured, and when did it start?"      *No Answer* 9. OTHER SYMPTOMS: "Do you have any other symptoms?" (e.g., headache, nasal discharge, facial rash)      *No Answer* 10. PREGNANCY: "Is there any chance you are pregnant?" "When was your last menstrual period?"       *No Answer*  Answer Assessment - Initial Assessment Questions 1. ONSET: "When did your symptoms start?" (e.g., hours, days, weeks, months)     Approx. 1 month ago  2. DURATION: "How long does the eye twitch or spasm last?"     Becoming constant  3. FREQUENCY:  "How often does this happen?"      Throughout the day  4. LOCATION:  "Where does the twitch or spasms occur?" (e.g., upper lid, lower lid, one or both eyes)     Left eye  5. VISION: "Do you have blurred vision?"      Some blurred vision 6. OTHER SYMPTOMS: "Do you have any other symptoms?" (e.g., eye pain, eye redness or drainage)    Eye dryness 7. CAUSE: "What do you think is causing your symptoms?" (e.g., eye strain, fatigue)     Not sure  Protocols used: Eye Pain and Other Symptoms-A-AH, Eyelid Twitch or Spasm-A-AH

## 2021-12-29 ENCOUNTER — Telehealth: Payer: Self-pay

## 2021-12-29 NOTE — Telephone Encounter (Signed)
Copied from CRM [id number]. Topic: General - Other >> Dec 28, 2021  2:20 PM Gaetana Michaelis A wrote: Reason for CRM: The patient would like to be contacted by social worker (crystal or nicole) when possible   The patient has additional concerns they would like to address further

## 2022-01-01 ENCOUNTER — Ambulatory Visit: Payer: Medicaid Other | Admitting: *Deleted

## 2022-01-01 ENCOUNTER — Telehealth: Payer: Self-pay | Admitting: *Deleted

## 2022-01-01 NOTE — Telephone Encounter (Signed)
°  Care Management   Follow Up Note   01/01/2022 Name: Jennifer Byrd MRN: [id number] DOB: 1996/12/22   Referred by: Alba Cory, MD Reason for referral : Care Coordination   An unsuccessful telephone outreach was attempted today. The patient was referred to the case management team for assistance with care management and care coordination.   Follow Up Plan: The patient has been provided with contact information for the care management team and has been advised to call with any health related questions or concerns.   [name], LCSW [facility] Care Management [phone]

## 2022-01-01 NOTE — Telephone Encounter (Signed)
. ° °  01/01/2022  Jennifer Byrd Billing 01-08-1996 [id number]   Phone call to patient, per patient's request, however patient not available at the time of call. Requested a call back at 4pm .  Plan: LCSW to contact patient at 4pm today   Elliot Gurney, [place] Worker  [facility] Care Management [phone]
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
## 2022-01-01 NOTE — Patient Instructions (Signed)
Visit Information  Thank you for taking time to visit with me today. Please don't hesitate to contact me if I can be of assistance to you before our next scheduled telephone appointment.  Following are the goals we discussed today:   - begin personal counseling- -referral made for ongoing counseling through [facility] -patient no longer agreeable to follow up with [name]-Per patient would like to be set up with a psychologist -Additional referral made through [facility]    Our next appointment is by telephone on 01/08/22 at 11am  Please call the care guide team at [phone] if you need to cancel or reschedule your appointment.   If you are experiencing a Mental Health or Behavioral Health Crisis or need someone to talk to, please call the Suicide and Crisis Lifeline: 988   The patient verbalized understanding of instructions, educational materials, and care plan provided today and declined offer to receive copy of patient instructions, educational materials, and care plan.   Telephone follow up appointment with care management team member scheduled for: 01/08/22   [name], LCSW Clinical Social Worker  [facility] Care Management [phone]
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
## 2022-01-04 ENCOUNTER — Encounter: Payer: Self-pay | Admitting: Oncology

## 2022-01-04 ENCOUNTER — Ambulatory Visit (INDEPENDENT_AMBULATORY_CARE_PROVIDER_SITE_OTHER): Payer: 59 | Admitting: Family Medicine

## 2022-01-04 ENCOUNTER — Encounter: Payer: Self-pay | Admitting: Family Medicine

## 2022-01-04 VITALS — BP 110/74 | HR 89 | Resp 16 | Ht 64.0 in | Wt 134.0 lb

## 2022-01-04 DIAGNOSIS — R0789 Other chest pain: Secondary | ICD-10-CM

## 2022-01-04 DIAGNOSIS — G253 Myoclonus: Secondary | ICD-10-CM | POA: Diagnosis not present

## 2022-01-04 DIAGNOSIS — F0781 Postconcussional syndrome: Secondary | ICD-10-CM

## 2022-01-04 MED ORDER — FAMOTIDINE 40 MG PO TABS: 40.0000 mg | ORAL_TABLET | Freq: Every day | ORAL | 0 refills | Status: DC

## 2022-01-05 ENCOUNTER — Telehealth: Payer: Self-pay

## 2022-01-05 NOTE — Telephone Encounter (Signed)
Copied from [place] [id number]. Topic: General - Other >> Jan 05, 2022 12:10 PM Valere Dross wrote: Reason for CRM: Pt called in stating if Crystal the social worker could give her a call back, something about an insurance that she is not with, please advise.

## 2022-01-08 ENCOUNTER — Telehealth: Payer: Self-pay | Admitting: *Deleted

## 2022-01-08 ENCOUNTER — Telehealth: Payer: Medicaid Other

## 2022-01-08 ENCOUNTER — Ambulatory Visit: Payer: Self-pay | Admitting: *Deleted

## 2022-01-08 DIAGNOSIS — F330 Major depressive disorder, recurrent, mild: Secondary | ICD-10-CM

## 2022-01-08 DIAGNOSIS — F418 Other specified anxiety disorders: Secondary | ICD-10-CM

## 2022-01-08 NOTE — Telephone Encounter (Signed)
°  Care Management   Follow Up Note   01/08/2022 Name: Jennifer Byrd MRN: [id number] DOB: June 09, 1996   Referred by: Alba Cory, MD Reason for referral : No chief complaint on file.   An unsuccessful telephone outreach was attempted today. The patient was referred to the case management team for assistance with care management and care coordination.   Follow Up Plan: Telephone follow up appointment with care management team member scheduled for: 01/12/22  [name], LCSW Clinical Social Worker  [facility] Care Management [phone]

## 2022-01-08 NOTE — Patient Instructions (Signed)
Visit Information  Thank you for taking time to visit with me today. Please don't hesitate to contact me if I can be of assistance to you before our next scheduled telephone appointment.  Following are the goals we discussed today:  begin personal counseling- -referral made for ongoing counseling through [facility] -patient no longer agreeable to follow up with [name]-Per patient would like to be set up with a psychologist -Additional referral made through [facility]  Our next appointment is by telephone on 01/19/22 at 1pm  Please call the care guide team at [phone] if you need to cancel or reschedule your appointment.   If you are experiencing a Mental Health or Behavioral Health Crisis or need someone to talk to, please call the Suicide and Crisis Lifeline: 988   The patient verbalized understanding of instructions, educational materials, and care plan provided today and declined offer to receive copy of patient instructions, educational materials, and care plan.   Telephone follow up appointment with care management team member scheduled for:01/19/22  [name], LCSW Clinical Social Worker  [facility] Care Management [phone]
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
## 2022-01-12 ENCOUNTER — Ambulatory Visit: Payer: Medicaid Other | Admitting: *Deleted

## 2022-01-14 NOTE — Patient Instructions (Signed)
Visit Information  Thank you for taking time to visit with me today. Please don't hesitate to contact me if I can be of assistance to you before our next scheduled telephone appointment.  Following are the goals we discussed today:   - begin personal counseling- -referral made for ongoing counseling through [facility] -patient no longer agreeable to follow up with [name]-Per patient would like to be set up with a psychologist -Additional referral made through [facility] -Please contact the customer service number on the back of your card for mental health provider list    Our next appointment is by telephone on 01/15/22 at 3pm  Please call the care guide team at [phone] if you need to cancel or reschedule your appointment.   If you are experiencing a Mental Health or Behavioral Health Crisis or need someone to talk to, please call the Suicide and Crisis Lifeline: 988   The patient verbalized understanding of instructions, educational materials, and care plan provided today and declined offer to receive copy of patient instructions, educational materials, and care plan.   Telephone follow up appointment with care management team member scheduled for: 01/15/22   [name], LCSW Clinical Social Worker  [facility] Care Management [phone]
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
## 2022-01-15 ENCOUNTER — Ambulatory Visit: Payer: 59 | Admitting: *Deleted

## 2022-01-15 DIAGNOSIS — F330 Major depressive disorder, recurrent, mild: Secondary | ICD-10-CM

## 2022-01-15 DIAGNOSIS — F418 Other specified anxiety disorders: Secondary | ICD-10-CM
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
## 2022-01-15 NOTE — Patient Instructions (Signed)
Visit Information  Thank you for taking time to visit with me today. Please don't hesitate to contact me if I can be of assistance to you before our next scheduled telephone appointment.  Following are the goals we discussed today:   - begin personal counseling- -patient no longer agreeable to follow up with [name]-Per patient would like to be set up with a psychologist -Customer Service [employer]-non commercial plan contacted-[phone] provider list requested by e-mail and received -Please call and set up initial intake appointment for ongoing mental health counseling from list received  Our next appointment is by telephone on 01/29/22 at 11am  Please call the care guide team at [phone] if you need to cancel or reschedule your appointment.   If you are experiencing a Mental Health or Behavioral Health Crisis or need someone to talk to, please call the Suicide and Crisis Lifeline: 988   The patient verbalized understanding of instructions, educational materials, and care plan provided today and declined offer to receive copy of patient instructions, educational materials, and care plan.   Telephone follow up appointment with care management team member scheduled for: 01/29/22  [name], LCSW Clinical Social Worker  [facility] Care Management [phone]

## 2022-01-19 ENCOUNTER — Telehealth: Payer: 59

## 2022-01-29 ENCOUNTER — Ambulatory Visit: Payer: 59 | Admitting: *Deleted

## 2022-01-29 DIAGNOSIS — F330 Major depressive disorder, recurrent, mild: Secondary | ICD-10-CM

## 2022-01-29 DIAGNOSIS — F418 Other specified anxiety disorders: Secondary | ICD-10-CM
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
## 2022-01-29 NOTE — Patient Instructions (Signed)
Visit Information  Thank you for taking time to visit with me today. Please don't hesitate to contact me if I can be of assistance to you before our next scheduled telephone appointment.  Following are the goals we discussed today:   begin personal counseling- -patient no longer agreeable to follow up with [name]-Per patient would like to be set up with a psychologist -[employer]-non commercial plan contacted-[phone] provider list requested by e-mail and received -Please call and set up initial intake appointment for ongoing mental health counseling from list received   If you are experiencing a Mental Health or Behavioral Health Crisis or need someone to talk to, please call the Suicide and Crisis Lifeline: 988   Patient verbalizes understanding of instructions and care plan provided today and agrees to view in MyChart. Active MyChart status confirmed with patient.    No further follow up required: patient to contact the mental health providers from in network list provider  to schedule initial intake appointment  [name], LCSW Clinical Social Worker  [facility] Care Management [phone]

## 2022-02-15 ENCOUNTER — Telehealth (INDEPENDENT_AMBULATORY_CARE_PROVIDER_SITE_OTHER): Payer: 59 | Admitting: Internal Medicine

## 2022-02-15 ENCOUNTER — Telehealth: Payer: Self-pay | Admitting: Family Medicine

## 2022-02-15 ENCOUNTER — Encounter: Payer: Self-pay | Admitting: Internal Medicine

## 2022-02-15 DIAGNOSIS — J069 Acute upper respiratory infection, unspecified: Secondary | ICD-10-CM

## 2022-02-15 NOTE — Progress Notes (Signed)
Virtual Visit via Telephone Note  I connected with Jennifer Byrd on 02/15/22 at 11:40 AM EST by telephone and verified that I am speaking with the correct person using two identifiers.  Location: Patient: Home Provider: [facility]   I discussed the limitations, risks, security and privacy concerns of performing an evaluation and management service by telephone and the availability of in person appointments. I also discussed with the patient that there may be a patient responsible charge related to this service. The patient expressed understanding and agreed to proceed.   History of Present Illness:  Jennifer Byrd is a 26 year old female presenting over the phone for URI and work note. She recently went to [facility] for sore throat, swelling, hoarse voice and nasal congestion  and was diagnosed with URI. Symptoms began 02/09/22 went to [facility] 02/10/22 for evaluation. Found to be COVID, strep and flu negative. Symptoms gradually resolving.   Case number: [id number]  URI Compliant:  -Worst symptom: sore throat and swelling, congestion  -Fever: no -Cough: no -Shortness of breath: no -Wheezing: yes -Chest pain: no -Chest tightness: no -Chest congestion: no -Nasal congestion: yes -Runny nose: yes -Post nasal drip: yes -Sore throat: yes -Swollen glands: yes -Sinus pressure: yes -Face pain: no -Ear pain: no  -Ear pressure: no  -Fatigue: yes -Context: better -Relief with OTC cold/cough medications: yes  -Treatments attempted:  Flonase     Observations/Objective:  General: no acute distress ENT: hoarse voice with occasional throat clearing Neuro: answers all questions appropriately   Assessment and Plan:  1. Upper respiratory tract infection, unspecified type: Symptoms resolving, continue Flonase and nasal saline. 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 Papers filled out and faxed.   Follow Up Instructions:  PRN    I discussed the assessment and treatment plan with the patient. The patient was provided an opportunity to ask questions and all were answered. The patient agreed with the plan and demonstrated an understanding of the instructions.   The patient was advised to call back or seek an in-person evaluation if the symptoms worsen or if the condition fails to improve as anticipated.  I provided 14 minutes of non-face-to-face time during this encounter.   Teodora Medici, DO

## 2022-02-15 NOTE — Telephone Encounter (Signed)
Patient went to Urgent Care on 02/18, has viral infectin and was taken out of work. She needs note for work

## 2022-02-15 NOTE — Telephone Encounter (Signed)
Copied from CRM [id number]. Topic: General - Other >> Feb 15, 2022  3:51 PM Jaquita Rector A wrote: Reason for CRM: Patient called in to ask nurse Asher Muir if she can refax FMLA paper work to her employer again since they did not receive it. Also asking if the forms can be uploaded to her My Chart so she can e-mail it in the the employers also. Please advise

## 2022-02-19 ENCOUNTER — Telehealth: Payer: Self-pay

## 2022-02-19 NOTE — Telephone Encounter (Signed)
Copied from CRM [id number]. Topic: General - Other >> Feb 19, 2022  1:11 PM Traci Sermon wrote: Reason for CRM: Pt called in requesting if her FMLA forms could be faxed back over to her job, they stated they have not received it.  Completed with confirmation.

## 2022-02-28 ENCOUNTER — Encounter: Payer: Self-pay | Admitting: Physician Assistant

## 2022-02-28 ENCOUNTER — Ambulatory Visit (INDEPENDENT_AMBULATORY_CARE_PROVIDER_SITE_OTHER): Payer: 59 | Admitting: Physician Assistant

## 2022-02-28 ENCOUNTER — Other Ambulatory Visit: Payer: Self-pay

## 2022-02-28 VITALS — BP 122/76 | HR 82 | Temp 98.3°F | Resp 16 | Ht 66.0 in | Wt 138.0 lb

## 2022-02-28 DIAGNOSIS — L918 Other hypertrophic disorders of the skin: Secondary | ICD-10-CM
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
## 2022-02-28 NOTE — Patient Instructions (Addendum)
Keep the area clean with gentle soap and warm water  ?Keep it dry and you can apply a small amount of Aquaphor or Vaseline to the area to help with healing ?Let us know if there is any redness or irritation at that site.  ?The referral team should call you to schedule your dermatology apt.  ? ?

## 2022-03-02 ENCOUNTER — Ambulatory Visit: Payer: Self-pay

## 2022-03-02 NOTE — Telephone Encounter (Signed)
Pt called, pt was asking about lab results of TSH on 03/2021. Pt was curious if since her numbers drop from 2.26 to 0.90 in 1 yr. Pt was asking she she has hypothyroidism due to the decrease. I advised pt that she is still in normal range at that time and if she is having any symptoms she could schedule an appt and come in for lab work. Pt states she will call back next week to schedule and that she wasn't really having symptoms besides fatigue and shakiness at times.  ?

## 2022-03-05 ENCOUNTER — Telehealth: Payer: Self-pay

## 2022-03-05 NOTE — Telephone Encounter (Signed)
Copied from CRM [id number]. Topic: General - Inquiry ?>> Mar 02, 2022  4:30 PM Jennifer Byrd D wrote: ?Reason for CRM: Pt called regarding labs for a thyroid panel she had done last April.  She said she was looking at them and they seemed off to her. ? ?CB#  [phone] ?

## 2022-04-13 ENCOUNTER — Ambulatory Visit: Payer: 59 | Admitting: Family Medicine

## 2022-04-18 ENCOUNTER — Ambulatory Visit: Payer: 59 | Admitting: Nurse Practitioner

## 2022-04-20 DIAGNOSIS — S83006A Unspecified dislocation of unspecified patella, initial encounter: Secondary | ICD-10-CM | POA: Insufficient documentation

## 2022-05-02 ENCOUNTER — Ambulatory Visit: Payer: 59 | Admitting: Nurse Practitioner

## 2022-05-03 ENCOUNTER — Other Ambulatory Visit: Payer: Self-pay

## 2022-05-03 ENCOUNTER — Ambulatory Visit (INDEPENDENT_AMBULATORY_CARE_PROVIDER_SITE_OTHER): Payer: 59 | Admitting: Nurse Practitioner

## 2022-05-03 ENCOUNTER — Other Ambulatory Visit (HOSPITAL_COMMUNITY)
Admission: RE | Admit: 2022-05-03 | Discharge: 2022-05-03 | Disposition: A | Discharge status: Home / Self Care | Payer: Medicaid Other | Source: Ambulatory Visit | Attending: Nurse Practitioner | Admitting: Nurse Practitioner

## 2022-05-03 VITALS — BP 110/70 | Temp 97.8°F | Resp 18 | Ht 66.0 in | Wt 139.5 lb

## 2022-05-03 DIAGNOSIS — N898 Other specified noninflammatory disorders of vagina: Secondary | ICD-10-CM | POA: Diagnosis present

## 2022-05-03 DIAGNOSIS — R8290 Unspecified abnormal findings in urine: Secondary | ICD-10-CM | POA: Insufficient documentation

## 2022-05-03 LAB — POCT URINALYSIS DIPSTICK
Bilirubin, UA: NEGATIVE
Glucose, UA: NEGATIVE
Ketones, UA: NEGATIVE
Nitrite, UA: NEGATIVE
Protein, UA: POSITIVE — AB
Spec Grav, UA: 1.02 (ref 1.010–1.025)
Urobilinogen, UA: 0.2 E.U./dL
pH, UA: 5 (ref 5.0–8.0)

## 2022-05-03 NOTE — Progress Notes (Signed)
? ?BP 110/70   Temp 97.8 ?F (36.6 ?C) (Oral)   Resp 18   Ht 5\' 6"  (1.676 m)   Wt 139 lb 8 oz (63.3 kg)   SpO2 97%   BMI 22.52 kg/m?   ? ?Subjective:  ? ? Patient ID: [name], female    DOB: 09-Jan-1996, 26 y.o.   MRN: [id number] ? ?HPI: ?Jennifer Byrd is a 26 y.o. female ? ?Chief Complaint  ?Patient presents with  ? Urinary Tract Infection  ?  Odor when urinating  ? ?Malodorous urine:  She says she has had a odor for about a week in a half and white vaginal discharge.  She was recently at the OB/GYN and they did do a vaginal swab.  It came back negative.  She continues to have symptoms.  She was given Flagyl gel which she has not used.  He denies any fever, lower back pain, dysuria, urinary frequency or urgency.  We will get urine dip, culture and vaginal swab. ? ?Relevant past medical, surgical, family and social history reviewed and updated as indicated. Interim medical history since our last visit reviewed. ?Allergies and medications reviewed and updated. ? ?Review of Systems ? ?Constitutional: Negative for fever or weight change.  ?Respiratory: Negative for cough and shortness of breath.   ?Cardiovascular: Negative for chest pain or palpitations.  ?Gastrointestinal: Negative for abdominal pain, no bowel changes.  ?GU: Positive vaginal discharge ?Musculoskeletal: Positive for gait problem currently using crutches.  Negative for joint swelling.  ?Skin: Negative for rash.  ?Neurological: Negative for dizziness or headache.  ?No other specific complaints in a complete review of systems (except as listed in HPI above).  ? ?   ?Objective:  ?  ?BP 110/70   Temp 97.8 ?F (36.6 ?C) (Oral)   Resp 18   Ht 5\' 6"  (1.676 m)   Wt 139 lb 8 oz (63.3 kg)   SpO2 97%   BMI 22.52 kg/m?   ?Wt Readings from Last 3 Encounters:  ?05/03/22 139 lb 8 oz (63.3 kg)  ?02/28/22 138 lb (62.6 kg)  ?01/04/22 134 lb (60.8 kg)  ?  ?Physical Exam ? ?Constitutional: Patient appears well-developed and well-nourished. No  distress.  ?HEENT: head atraumatic, normocephalic, pupils equal and reactive to light,  neck supple, throat within normal limits ?Cardiovascular: Normal rate, regular rhythm and normal heart sounds.  No murmur heard. No BLE edema. ?Pulmonary/Chest: Effort normal and breath sounds normal. No respiratory distress. ?Abdominal: Soft.  There is no tenderness.No CVA tenderness ?Psychiatric: Patient has a normal mood and affect. behavior is normal. Judgment and thought content normal.  ?Results for orders placed or performed in visit on 12/22/21  ?HM PAP SMEAR  ?Result Value Ref Range  ? HM Pap smear Normal   ? ?   ?Assessment & Plan:  ? ?Problem List Items Addressed This Visit   ?None ?Visit Diagnoses   ? ? Malodorous urine    -  Primary  ? Symptoms for about a week and a half.  We will get urine dip, urine culture, vaginal swab.  ? Relevant Orders  ? POCT urinalysis dipstick  ? Urine Culture  ? Cervicovaginal ancillary only  ? Vaginal discharge      ? Patient reports symptoms for a week and a half.  We will get vaginal swab.  ? Relevant Orders  ? Cervicovaginal ancillary only  ? ?  ?  ? ?Follow up plan: ?Return if symptoms worsen or fail to improve. ? ? ? ? ? ?

## 2022-05-04 LAB — CERVICOVAGINAL ANCILLARY ONLY
Bacterial Vaginitis (gardnerella): NEGATIVE
Candida Glabrata: NEGATIVE
Candida Vaginitis: NEGATIVE
Chlamydia: NEGATIVE
Comment: NEGATIVE
Comment: NEGATIVE
Comment: NEGATIVE
Comment: NEGATIVE
Comment: NEGATIVE
Comment: NORMAL
Neisseria Gonorrhea: NEGATIVE
Trichomonas: NEGATIVE

## 2022-05-04 LAB — URINE CULTURE
MICRO NUMBER:: 13383830
SPECIMEN QUALITY:: ADEQUATE

## 2022-05-08 ENCOUNTER — Encounter: Payer: Self-pay | Admitting: Nurse Practitioner

## 2022-06-01 ENCOUNTER — Encounter: Payer: Self-pay | Admitting: Nurse Practitioner

## 2022-06-01 ENCOUNTER — Other Ambulatory Visit: Payer: Self-pay

## 2022-06-01 ENCOUNTER — Ambulatory Visit (INDEPENDENT_AMBULATORY_CARE_PROVIDER_SITE_OTHER): Payer: 59 | Admitting: Nurse Practitioner

## 2022-06-01 VITALS — BP 110/70 | HR 97 | Temp 98.2°F | Resp 18 | Ht 66.0 in | Wt 139.4 lb

## 2022-06-01 DIAGNOSIS — Z131 Encounter for screening for diabetes mellitus: Secondary | ICD-10-CM

## 2022-06-01 DIAGNOSIS — R631 Polydipsia: Secondary | ICD-10-CM | POA: Diagnosis not present

## 2022-06-01 DIAGNOSIS — R682 Dry mouth, unspecified: Secondary | ICD-10-CM | POA: Diagnosis not present

## 2022-06-01 DIAGNOSIS — R251 Tremor, unspecified: Secondary | ICD-10-CM | POA: Diagnosis not present

## 2022-06-01 DIAGNOSIS — Z13220 Encounter for screening for lipoid disorders: Secondary | ICD-10-CM | POA: Diagnosis not present

## 2022-06-01 NOTE — Progress Notes (Signed)
BP 110/70   Pulse 97   Temp 98.2 F (36.8 C) (Oral)   Resp 18   Ht 5\' 6"  (1.676 m)   Wt 139 lb 6.4 oz (63.2 kg)   SpO2 99%   BMI 22.50 kg/m    Subjective:    Patient ID: [employer], female    DOB: 02/22/1996, 26 y.o.   MRN: [id number]  HPI: Jennifer Byrd is a 26 y.o. female  Chief Complaint  Patient presents with   Consult    Dry mouth, check Blood sugar   Dry mouth/increased thirst/ shaky: Patient reports that for a few weeks now she has been noticing that she has a dry mouth is feeling more thirsty and has had episodes of shakiness.  Patient denies any recent illness, nausea or vomiting.  Patient's not sure about the dry mouth she says that she notices after she eats Pasta.  She says she also wears invisible line so she is wondering if that is causing her to have the dry mouth and be thirsty.  She states she is worried that she might have diabetes because it does run in her family.  Patient recently had her thyroid checked, it was normal.  Discussed with patient about getting blood work.  Patient is agreement with plan.  Relevant past medical, surgical, family and social history reviewed and updated as indicated. Interim medical history since our last visit reviewed. Allergies and medications reviewed and updated.  Review of Systems  Constitutional: Negative for fever or weight change.  Respiratory: Negative for cough and shortness of breath.   Cardiovascular: Negative for chest pain or palpitations.  Gastrointestinal: Negative for abdominal pain, no bowel changes.  Musculoskeletal: Negative for gait problem or joint swelling.  Skin: Negative for rash.  Neurological: Negative for dizziness or headache.  No other specific complaints in a complete review of systems (except as listed in HPI above).      Objective:    BP 110/70   Pulse 97   Temp 98.2 F (36.8 C) (Oral)   Resp 18   Ht 5\' 6"  (1.676 m)   Wt 139 lb 6.4 oz (63.2 kg)   SpO2 99%   BMI 22.50  kg/m   Wt Readings from Last 3 Encounters:  06/01/22 139 lb 6.4 oz (63.2 kg)  05/03/22 139 lb 8 oz (63.3 kg)  02/28/22 138 lb (62.6 kg)    Physical Exam  Constitutional: Patient appears well-developed and well-nourished.  No distress.  HEENT: head atraumatic, normocephalic, pupils equal and reactive to light, neck supple Cardiovascular: Normal rate, regular rhythm and normal heart sounds.  No murmur heard. No BLE edema. Pulmonary/Chest: Effort normal and breath sounds normal. No respiratory distress. Abdominal: Soft.  There is no tenderness. Psychiatric: Patient has a normal mood and affect. behavior is normal. Judgment and thought content normal.  Results for orders placed or performed in visit on 05/03/22  Urine Culture   Specimen: Urine  Result Value Ref Range   MICRO NUMBER: [id number]    SPECIMEN QUALITY: Adequate    Sample Source URINE    STATUS: FINAL    ISOLATE 1:      Less than 10,000 CFU/mL of single Gram positive organism isolated. No further testing will be performed. If clinically indicated, recollection using a method to minimize contamination, with prompt transfer to Urine Culture Transport Tube, is recommended.  POCT urinalysis dipstick  Result Value Ref Range   Color, UA yellow    Clarity, UA clear  Glucose, UA Negative Negative   Bilirubin, UA negative    Ketones, UA negative    Spec Grav, UA 1.020 1.010 - 1.025   Blood, UA trace    pH, UA 5.0 5.0 - 8.0   Protein, UA Positive (A) Negative   Urobilinogen, UA 0.2 0.2 or 1.0 E.U./dL   Nitrite, UA negative    Leukocytes, UA Trace (A) Negative   Appearance clear    Odor none   Cervicovaginal ancillary only  Result Value Ref Range   Bacterial Vaginitis (gardnerella) Negative    Chlamydia Negative    Neisseria Gonorrhea Negative    Candida Vaginitis Negative    Candida Glabrata Negative    Trichomonas Negative    Comment      Normal Reference Range Bacterial Vaginosis - Negative   Comment Normal Reference  Ranger Chlamydia - Negative    Comment      Normal Reference Range Neisseria Gonorrhea - Negative   Comment Normal Reference Range Candida Species - Negative    Comment Normal Reference Range Candida Galbrata - Negative    Comment Normal Reference Range Trichomonas - Negative       Assessment & Plan:   Problem List Items Addressed This Visit   None Visit Diagnoses     Dry mouth    -  Primary   Unclear etiology we will be getting lab work.   Relevant Orders   CBC with Differential/Platelet   COMPLETE METABOLIC PANEL WITH GFR   Hemoglobin A1c   Increased thirst       Unclear etiology we will be getting lab work.   Relevant Orders   CBC with Differential/Platelet   COMPLETE METABOLIC PANEL WITH GFR   Hemoglobin A1c   Shaky       Unclear etiology we will be getting lab work.   Relevant Orders   CBC with Differential/Platelet   COMPLETE METABOLIC PANEL WITH GFR   Hemoglobin A1c   Screening for cholesterol level       Relevant Orders   Lipid panel   Screening for diabetes mellitus       Relevant Orders   COMPLETE METABOLIC PANEL WITH GFR   Hemoglobin A1c        Follow up plan: No follow-ups on file.

## 2022-06-02 LAB — COMPLETE METABOLIC PANEL WITH GFR
AG Ratio: 1.5 (calc) (ref 1.0–2.5)
ALT: 10 U/L (ref 6–29)
AST: 10 U/L (ref 10–30)
Albumin: 4.2 g/dL (ref 3.6–5.1)
Alkaline phosphatase (APISO): 56 U/L (ref 31–125)
BUN: 17 mg/dL (ref 7–25)
CO2: 28 mmol/L (ref 20–32)
Calcium: 9.3 mg/dL (ref 8.6–10.2)
Chloride: 102 mmol/L (ref 98–110)
Creat: 0.78 mg/dL (ref 0.50–0.96)
Globulin: 2.8 g/dL (calc) (ref 1.9–3.7)
Glucose, Bld: 86 mg/dL (ref 65–99)
Potassium: 3.9 mmol/L (ref 3.5–5.3)
Sodium: 136 mmol/L (ref 135–146)
Total Bilirubin: 0.4 mg/dL (ref 0.2–1.2)
Total Protein: 7 g/dL (ref 6.1–8.1)
eGFR: 107 mL/min/{1.73_m2} (ref >=60)

## 2022-06-02 LAB — HEMOGLOBIN A1C
Hgb A1c MFr Bld: 4.4 % of total Hgb (ref <5.7)
Mean Plasma Glucose: 80 mg/dL
eAG (mmol/L): 4.4 mmol/L

## 2022-06-02 LAB — CBC WITH DIFFERENTIAL/PLATELET
Absolute Monocytes: 300 cells/uL (ref 200–950)
Basophils Absolute: 31 cells/uL (ref 0–200)
Basophils Relative: 0.8 %
Eosinophils Absolute: 160 cells/uL (ref 15–500)
Eosinophils Relative: 4.1 %
HCT: 40.2 % (ref 35.0–45.0)
Hemoglobin: 13.1 g/dL (ref 11.7–15.5)
Lymphs Abs: 1693 cells/uL (ref 850–3900)
MCH: 25.8 pg — ABNORMAL LOW (ref 27.0–33.0)
MCHC: 32.6 g/dL (ref 32.0–36.0)
MCV: 79.3 fL — ABNORMAL LOW (ref 80.0–100.0)
MPV: 11.3 fL (ref 7.5–12.5)
Monocytes Relative: 7.7 %
Neutro Abs: 1716 cells/uL (ref 1500–7800)
Neutrophils Relative %: 44 %
Platelets: 232 10*3/uL (ref 140–400)
RBC: 5.07 10*6/uL (ref 3.80–5.10)
RDW: 16.2 % — ABNORMAL HIGH (ref 11.0–15.0)
Total Lymphocyte: 43.4 %
WBC: 3.9 10*3/uL (ref 3.8–10.8)

## 2022-06-02 LAB — LIPID PANEL
Cholesterol: 210 mg/dL — ABNORMAL HIGH (ref <200)
HDL: 73 mg/dL (ref >=50)
LDL Cholesterol (Calc): 122 mg/dL (calc) — ABNORMAL HIGH
Non-HDL Cholesterol (Calc): 137 mg/dL (calc) — ABNORMAL HIGH (ref <130)
Total CHOL/HDL Ratio: 2.9 (calc) (ref <5.0)
Triglycerides: 59 mg/dL (ref <150)

## 2022-06-03 ENCOUNTER — Emergency Department
Admission: EM | Admit: 2022-06-03 | Discharge: 2022-06-03 | Disposition: A | Discharge status: Home / Self Care | Payer: Medicaid Other | Attending: Emergency Medicine | Admitting: Emergency Medicine

## 2022-06-03 ENCOUNTER — Other Ambulatory Visit: Payer: Self-pay

## 2022-06-03 DIAGNOSIS — R300 Dysuria: Secondary | ICD-10-CM | POA: Insufficient documentation

## 2022-06-03 DIAGNOSIS — R350 Frequency of micturition: Secondary | ICD-10-CM | POA: Insufficient documentation

## 2022-06-03 LAB — URINALYSIS, ROUTINE W REFLEX MICROSCOPIC
Bilirubin Urine: NEGATIVE
Glucose, UA: NEGATIVE mg/dL
Hgb urine dipstick: NEGATIVE
Ketones, ur: NEGATIVE mg/dL
Leukocytes,Ua: NEGATIVE
Nitrite: NEGATIVE
Protein, ur: NEGATIVE mg/dL
Specific Gravity, Urine: 1.008 (ref 1.005–1.030)
pH: 6 (ref 5.0–8.0)

## 2022-06-03 LAB — CBG MONITORING, ED: Glucose-Capillary: 87 mg/dL (ref 70–99)

## 2022-06-03 NOTE — ED Triage Notes (Signed)
Ambulatory to triage with c/o frequent urination. Burning. Sx began today.

## 2022-06-03 NOTE — ED Provider Notes (Signed)
[facility] Provider Note  Patient Contact: 10:39 PM (approximate)   History   Dysuria   HPI  Jennifer Byrd is a 26 y.o. female presents to the emergency department with increased urinary frequency that started tonight.  Patient also has had some mild dysuria.  Patient states that she has had C. difficile in the past.  Denies a history of frequent urinary tract infections.  She states that she has not had any new sexual activity and has no concerns for STDs.  No fever, chills, low back pain or nausea and vomiting.      Physical Exam   Triage Vital Signs: ED Triage Vitals  Enc Vitals Group     BP 06/03/22 2052 121/87     Pulse Rate 06/03/22 2052 85     Resp 06/03/22 2052 18     Temp 06/03/22 2052 (!) 97.4 F (36.3 C)     Temp Source 06/03/22 2052 Oral     SpO2 06/03/22 2052 97 %     Weight 06/03/22 2052 137 lb (62.1 kg)     Height 06/03/22 2052 5\' 6"  (1.676 m)     Head Circumference --      Peak Flow --      Pain Score 06/03/22 2057 5     Pain Loc --      Pain Edu? --      Excl. in GC? --     Most recent vital signs: Vitals:   06/03/22 2052  BP: 121/87  Pulse: 85  Resp: 18  Temp: (!) 97.4 F (36.3 C)  SpO2: 97%     General: Alert and in no acute distress. Eyes:  PERRL. EOMI. Head: No acute traumatic findings ENT:      Ears:       Nose: No congestion/rhinnorhea.      Mouth/Throat: Mucous membranes are moist. Neck: No stridor. No cervical spine tenderness to palpation. Cardiovascular:  Good peripheral perfusion Respiratory: Normal respiratory effort without tachypnea or retractions. Lungs CTAB. Good air entry to the bases with no decreased or absent breath sounds. Gastrointestinal: Bowel sounds 4 quadrants. Soft and nontender to palpation. No guarding or rigidity. No palpable masses. No distention. No CVA tenderness. Musculoskeletal: Full range of motion to all extremities.  Neurologic:  No gross focal neurologic deficits  are appreciated.  Skin:   No rash noted Other:   ED Results / Procedures / Treatments   Labs (all labs ordered are listed, but only abnormal results are displayed) Labs Reviewed  URINALYSIS, ROUTINE W REFLEX MICROSCOPIC - Abnormal; Notable for the following components:      Result Value   Color, Urine STRAW (*)    APPearance CLEAR (*)    All other components within normal limits  URINE CULTURE  CBG MONITORING, ED       PROCEDURES:  Critical Care performed: No  Procedures   MEDICATIONS ORDERED IN ED: Medications - No data to display   IMPRESSION / MDM / ASSESSMENT AND PLAN / ED COURSE  I reviewed the triage vital signs and the nursing notes.                              Assessment and plan Increased urinary frequency 26 year old female presents to the emergency department with dysuria and increased urinary frequency that started today.  Vital signs are reassuring at triage.  On exam, patient was alert, active and nontoxic-appearing.  Urinalysis  reassuring without findings concerning for cystitis.  Urine culture in process.  Given history of C. difficile, will await urine culture before initiating treatment with antibiotics.     FINAL CLINICAL IMPRESSION(S) / ED DIAGNOSES   Final diagnoses:  Increased urinary frequency     Rx / DC Orders   ED Discharge Orders     None        Note:  This document was prepared using Dragon voice recognition software and may include unintentional dictation errors.   Pia Mau Reynolds, Cordelia Poche 06/03/22 2242    Sharman Cheek, MD 06/09/22 [phone]

## 2022-06-04 ENCOUNTER — Ambulatory Visit: Payer: 59 | Admitting: Family Medicine

## 2022-06-05 ENCOUNTER — Encounter: Payer: Self-pay | Admitting: Family Medicine

## 2022-06-05 LAB — URINE CULTURE: Culture: NO GROWTH

## 2022-06-17 ENCOUNTER — Encounter: Payer: Self-pay | Admitting: Nurse Practitioner

## 2022-06-22 ENCOUNTER — Ambulatory Visit: Payer: 59 | Admitting: Physician Assistant

## 2022-06-25 ENCOUNTER — Encounter: Payer: Self-pay | Admitting: Nurse Practitioner

## 2022-06-25 ENCOUNTER — Ambulatory Visit (INDEPENDENT_AMBULATORY_CARE_PROVIDER_SITE_OTHER): Payer: 59 | Admitting: Nurse Practitioner

## 2022-06-25 ENCOUNTER — Other Ambulatory Visit: Payer: Self-pay

## 2022-06-25 VITALS — BP 112/76 | HR 94 | Temp 97.8°F | Resp 18 | Ht 66.0 in | Wt 133.9 lb

## 2022-06-25 DIAGNOSIS — R2242 Localized swelling, mass and lump, left lower limb: Secondary | ICD-10-CM | POA: Diagnosis not present

## 2022-06-25 DIAGNOSIS — W57XXXA Bitten or stung by nonvenomous insect and other nonvenomous arthropods, initial encounter: Secondary | ICD-10-CM | POA: Diagnosis not present

## 2022-06-25 NOTE — Progress Notes (Signed)
   BP 112/76   Pulse 94   Temp 97.8 F (36.6 C) (Oral)   Resp 18   Ht 5\' 6"  (1.676 m)   Wt 133 lb 14.4 oz (60.7 kg)   SpO2 99%   BMI 21.61 kg/m    Subjective:    Patient ID: , female    DOB: 1996/03/13, 26 y.o.   MRN: [id number]  HPI: Jennifer Byrd is a 25 y.o. female  Chief Complaint  Patient presents with   Insect Bite    Left foot   Left foot swelling/bug bite: Patient reports approximately 2 weeks ago she was bit by an ant or spider on her left foot.  She says her foot started to swell.  There was no redness or drainage.  Patient states she did end up going to the emergency room they gave her Pepcid, Benadryl and Decadron.  Patient states she has been doing much better since then.  Patient reports the swelling has significantly improved.  She says she is doing much better.  Patient does have an appointment with her allergist in August.  Upon assessment no signs of infection, or swelling.  Reassurance given to patient.  Relevant past medical, surgical, family and social history reviewed and updated as indicated. Interim medical history since our last visit reviewed. Allergies and medications reviewed and updated.  Review of Systems Constitutional: Negative for fever or weight change.  Respiratory: Negative for cough and shortness of breath.   Cardiovascular: Negative for chest pain or palpitations.  Gastrointestinal: Negative for abdominal pain, no bowel changes.  Musculoskeletal: Negative for gait problem or joint swelling.  Skin: Negative for rash.  Neurological: Negative for dizziness or headache.  No other specific complaints in a complete review of systems (except as listed in HPI above).      Objective:    BP 112/76   Pulse 94   Temp 97.8 F (36.6 C) (Oral)   Resp 18   Ht 5\' 6"  (1.676 m)   Wt 133 lb 14.4 oz (60.7 kg)   SpO2 99%   BMI 21.61 kg/m   Wt Readings from Last 3 Encounters:  06/25/22 133 lb 14.4 oz (60.7 kg)  06/03/22 137  lb (62.1 kg)  06/01/22 139 lb 6.4 oz (63.2 kg)    Physical Exam Constitutional: Patient appears well-developed and well-nourished. No distress.  HEENT: head atraumatic, normocephalic, pupils equal and reactive to light, neck supple Cardiovascular: Normal rate, regular rhythm and normal heart sounds.  No murmur heard. No BLE edema. Pulmonary/Chest: Effort normal and breath sounds normal. No respiratory distress. Abdominal: Soft.  There is no tenderness. Psychiatric: Patient has a normal mood and affect. behavior is normal. Judgment and thought content normal.      Assessment & Plan:   Problem List Items Addressed This Visit   None Visit Diagnoses     Localized swelling of left foot    -  Primary   Condition has improved.  Reassurance provided.   Bug bite, initial encounter       Condition has improved.  Reassurance given.        Follow up plan: Return if symptoms worsen or fail to improve.

## 2022-06-25 NOTE — Progress Notes (Signed)
[facility]

## 2022-06-26 ENCOUNTER — Encounter: Payer: Self-pay | Admitting: Nurse Practitioner

## 2022-07-13 ENCOUNTER — Encounter: Payer: Self-pay | Admitting: Nurse Practitioner

## 2022-08-13 ENCOUNTER — Encounter: Payer: Self-pay | Admitting: Nurse Practitioner

## 2022-08-14 ENCOUNTER — Encounter: Payer: Self-pay | Admitting: Nurse Practitioner

## 2022-08-15 ENCOUNTER — Ambulatory Visit: Payer: 59 | Admitting: Internal Medicine

## 2022-08-17 ENCOUNTER — Encounter: Payer: Self-pay | Admitting: Internal Medicine

## 2022-08-17 ENCOUNTER — Other Ambulatory Visit: Payer: Self-pay

## 2022-08-17 ENCOUNTER — Ambulatory Visit (INDEPENDENT_AMBULATORY_CARE_PROVIDER_SITE_OTHER): Payer: 59 | Admitting: Internal Medicine

## 2022-08-17 VITALS — BP 110/70 | HR 84 | Temp 97.8°F | Resp 18 | Ht 66.0 in | Wt 132.0 lb

## 2022-08-17 DIAGNOSIS — K581 Irritable bowel syndrome with constipation: Secondary | ICD-10-CM | POA: Diagnosis not present

## 2022-08-17 DIAGNOSIS — W57XXXA Bitten or stung by nonvenomous insect and other nonvenomous arthropods, initial encounter: Secondary | ICD-10-CM

## 2022-08-17 NOTE — Progress Notes (Signed)
Acute Office Visit  Subjective:     Patient ID: Jennifer Byrd, female    DOB: 07-15-1996, 26 y.o.   MRN: [id number]  Chief Complaint  Patient presents with   Irritable Bowel Syndrome   Insect Bite    On back of thighs    HPI Patient is in today for bites on legs.   RASH Duration:   2 days   Location: legs  Itching: no Burning: no Redness: yes Oozing: no Scaling: no Blisters: no Painful: no Fevers: no Recent illness: no Recent travel:no History of same: no Context: better Alleviating factors: nothing Treatments attempted:nothing Shortness of breath: no  Throat/tongue swelling: no Myalgias/arthralgias: no  Constipation: -Does have a history of IBS -Diarrhea 9 days ago, then no BM since then -Currently having some abdominal bloating, cramping -Trying warm apple juice, Metamucil, Benefiber and Miralax but doesn't like the way the medicine feels in her throat. Has a history of multiple allergies and is worried the fiber will make her allergies worse.   Review of Systems  Constitutional:  Negative for chills and fever.  Gastrointestinal:  Positive for constipation. Negative for abdominal pain, diarrhea, heartburn, nausea and vomiting.  Genitourinary:  Negative for dysuria and frequency.  Skin:  Positive for rash.        Objective:    BP 110/70   Pulse 84   Temp 97.8 F (36.6 C) (Oral)   Resp 18   Ht 5\' 6"  (1.676 m)   Wt 132 lb (59.9 kg)   SpO2 99%   BMI 21.31 kg/m  BP Readings from Last 3 Encounters:  08/17/22 110/70  06/25/22 112/76  06/03/22 121/87   Wt Readings from Last 3 Encounters:  08/17/22 132 lb (59.9 kg)  06/25/22 133 lb 14.4 oz (60.7 kg)  06/03/22 137 lb (62.1 kg)     Physical Exam Constitutional:      Appearance: Normal appearance.  HENT:     Head: Normocephalic and atraumatic.  Eyes:     Conjunctiva/sclera: Conjunctivae normal.  Cardiovascular:     Rate and Rhythm: Normal rate and regular rhythm.  Pulmonary:      Effort: Pulmonary effort is normal.     Breath sounds: Normal breath sounds.  Abdominal:     General: Bowel sounds are normal. There is no distension.     Palpations: Abdomen is soft.     Tenderness: There is no abdominal tenderness.  Skin:    General: Skin is warm and dry.     Comments: 2 small healing bug bites on back of leg, nearly resolved  Neurological:     General: No focal deficit present.     Mental Status: She is alert. Mental status is at baseline.  Psychiatric:     Comments: Anxious      No results found for any visits on 08/17/22.      Assessment & Plan:   1. Irritable bowel syndrome with constipation: Has gone several days without a bowel movement but did pass a small amount of stool earlier this week. Recommend increasing fluid intake, fiber supplements and start with Miralax 1 capful daily to soften stools. Reassurance provided about allergies. Patient had been on Linzess in the past but didn't like it. Patient's anxiety is increased but says she working through it with her counselor and is not interested in medication. Doesn't like to take medication in general. Did discuss abdominal massage and myofascial techniques to help facilitate stool passage. Referral to GI placed. Patient will call  if she continue to have symptoms despite treatments.   - Ambulatory referral to Gastroenterology  2. Bug bite, initial encounter: Resolving, recommend over the counter Benadryl cream as needed for itching.    Return if symptoms worsen or fail to improve.  Margarita Mail, DO

## 2022-08-17 NOTE — Patient Instructions (Addendum)
It was great seeing you today!  Plan discussed at today's visit: -Increase fluid intake and fiber intake  -Recommend Miralax 1 capful daily to help with constipation   Follow up in:  Take care and let Jennifer Byrd know if you have any questions or concerns prior to your next visit.  Dr. Caralee Ates  Chronic Constipation Chronic constipation is a condition in which a person has three or fewer bowel movements a week, for 3 months or longer. This condition is especially common in older adults. What are the causes? Causes of chronic constipation may include: Not drinking enough fluid, eating enough food or fiber, or getting enough physical activity. Pregnancy. A tear in the anus (anal fissure). Blockage in the bowel (bowel obstruction). Narrowing of the bowel (bowel stricture). Having a long-term medical condition, such as: Diabetes, hypothyroidism, or iron-deficiency anemia. Stroke or spinal cord injury. Multiple sclerosis or Parkinson's disease. Colon cancer. Dementia. Inflammatory bowel disease (IBD), outward collapse of the rectum (rectal prolapse), or hemorrhoids. Taking certain medicines, including: Narcotics. These are a certain type of prescription pain medicine. Antacids or iron supplements. Water pills (diuretics). Certain blood pressure medicines. Anti-seizure medicines. Antidepressants. Medicines for Parkinson's disease. Other causes of this condition may include: Stress. Problems in the nerves and muscles that control the movement of stool. Weak or impaired pelvic floor muscles. What increases the risk? You may be at higher risk for chronic constipation if: You are older than age 54. You are female. You live in a long-term care facility. You have a long-term disease. You have a mental health disorder or eating disorder. What are the signs or symptoms? The main symptom of chronic constipation is having three or fewer bowel movements a week for several weeks. Other signs and  symptoms may vary from person to person. These include: Pushing hard (straining) to pass stool, or having hard or lumpy stools. Painful bowel movements. Having lower abdominal discomfort, such as cramps or bloating. Being unable to have a bowel movement when you feel the urge, or feeling like you still need to pass stool after a bowel movement. Feeling that you have something in your rectum that is blocking or preventing bowel movements. Seeing blood on the toilet paper or in your stool. Worsening confusion (in older adults). How is this diagnosed? This condition may be diagnosed based on: Your symptoms and medical history. You will be asked about your symptoms, lifestyle, diet, and any medicines that you are taking. A physical exam. Your abdomen will be examined. A digital rectal exam may be done. For this exam, a health care provider places a lubricated, gloved finger into the rectum. Tests to check for any underlying causes of your constipation. These may be ordered if you have bleeding in your rectum, weight loss, or a family history of colon cancer. In these cases, you may have: Imaging studies of the colon. These may include X-ray, ultrasound, or a CT scan. Blood tests. A procedure to examine the inside of your colon (colonoscopy). More specialized tests to check: Whether your anal sphincter works well. This is a ring-shaped muscle that controls the closing of the anus. How well food moves through your colon. Tests to measure the nerve signal in your pelvic floor muscles (electromyography). How is this treated? Treatment for chronic constipation depends on the cause. Most often, treatment starts with: Being more active and getting regular exercise. Drinking more fluids. Adding fiber to your diet. Sources of fiber include fruits, vegetables, whole grains, and fiber supplements. Using medicines such  as stool softeners or medicines that increase contractions in your digestive system  (pro-motility agents). Training your pelvic muscles with biofeedback. Surgery, if there is obstruction. Treatment may also include: Stopping or changing some medicines if they cause constipation. Using a fiber supplement (bulk laxative) or stool softener. Using a prescription laxative. This works by [employer] into your colon (osmotic laxative). You may also need to see a specialist who treats conditions of the digestive system (gastroenterologist). Follow these instructions at home: Medicines Take over-the-counter and prescription medicines only as told by your health care provider. If you are taking a laxative, take it as told by your health care provider. Eating and drinking  Eat a balanced diet that includes enough fiber. Ask your health care provider to recommend a diet that is right for you. Drink clear fluids, especially water. Avoid drinking alcohol, caffeine, and soda. These can make constipation worse. Drink enough fluid to keep your urine pale yellow. General instructions Get some physical activity every day. Ask your health care provider what activities are safe for you. Get colon cancer screenings as told by your health care provider. Keep all follow-up visits as told by your health care provider. This is important. Contact a health care provider if you have: Three or fewer bowel movements a week. Stools that are hard or lumpy. Blood on the toilet paper or in your stool after you have a bowel movement. Unexplained weight loss. Rectum (rectal) pain. Stool leakage. Nausea or vomiting. Get help right away if you have: Rectal bleeding or you pass blood clots. Severe rectal pain. Body tissue that pushes out (protrudes) from your anus. Severe pain or bloating (distension) in your abdomen. Vomiting that you cannot control. Summary Chronic constipation is a condition in which a person has three or fewer bowel movements a week, for 3 months or longer. You may have a  higher risk for this condition if you are an older adult, you are female, or you have a long-term disease. Treatment for this condition depends on the cause. Most treatments for chronic constipation include adding fiber to your diet, drinking more fluids, and getting more physical activity. You may also need to treat any underlying medical conditions or stop or change certain medicines if they cause constipation. If lifestyle changes do not relieve constipation, your health care provider may recommend taking a laxative. This information is not intended to replace advice given to you by your health care provider. Make sure you discuss any questions you have with your health care provider. Document Revised: 10/28/2019 Document Reviewed: 10/28/2019 Elsevier Patient Education  2023 [employer].

## 2022-08-28 ENCOUNTER — Other Ambulatory Visit: Payer: Self-pay | Admitting: Internal Medicine

## 2022-08-28 ENCOUNTER — Telehealth: Payer: Self-pay | Admitting: Family Medicine

## 2022-08-28 DIAGNOSIS — K581 Irritable bowel syndrome with constipation: Secondary | ICD-10-CM

## 2022-08-28 NOTE — Telephone Encounter (Signed)
Pt states GI office can not see her until 2024  Pt inquiring if referral can be marked as urgent to get a sooner appt or if another referral can be placed for anther GI office  Please advise and fu w/ pt

## 2022-09-02 ENCOUNTER — Encounter: Payer: Self-pay | Admitting: Internal Medicine

## 2022-10-30 ENCOUNTER — Encounter: Payer: Self-pay | Admitting: Family Medicine

## 2022-10-30 ENCOUNTER — Other Ambulatory Visit (HOSPITAL_COMMUNITY)
Admission: RE | Admit: 2022-10-30 | Discharge: 2022-10-30 | Disposition: A | Discharge status: Home / Self Care | Payer: 59 | Source: Ambulatory Visit | Attending: Family Medicine | Admitting: Family Medicine

## 2022-10-30 ENCOUNTER — Ambulatory Visit (INDEPENDENT_AMBULATORY_CARE_PROVIDER_SITE_OTHER): Payer: 59 | Admitting: Family Medicine

## 2022-10-30 VITALS — BP 110/70 | HR 77 | Temp 97.6°F | Resp 16 | Ht 66.0 in | Wt 132.4 lb

## 2022-10-30 DIAGNOSIS — Z113 Encounter for screening for infections with a predominantly sexual mode of transmission: Secondary | ICD-10-CM | POA: Insufficient documentation

## 2022-10-30 DIAGNOSIS — N760 Acute vaginitis: Secondary | ICD-10-CM | POA: Insufficient documentation
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
## 2022-10-31 ENCOUNTER — Encounter: Payer: Self-pay | Admitting: Oncology

## 2022-10-31 ENCOUNTER — Ambulatory Visit: Payer: 59 | Admitting: Family Medicine

## 2022-11-01 LAB — CERVICOVAGINAL ANCILLARY ONLY
Bacterial Vaginitis (gardnerella): NEGATIVE
Candida Glabrata: NEGATIVE
Candida Vaginitis: NEGATIVE
Chlamydia: NEGATIVE
Comment: NEGATIVE
Comment: NEGATIVE
Comment: NEGATIVE
Comment: NEGATIVE
Comment: NEGATIVE
Comment: NORMAL
Neisseria Gonorrhea: NEGATIVE
Trichomonas: NEGATIVE

## 2022-11-15 ENCOUNTER — Encounter: Payer: Self-pay | Admitting: Emergency Medicine

## 2022-11-15 ENCOUNTER — Emergency Department
Admission: EM | Admit: 2022-11-15 | Discharge: 2022-11-15 | Disposition: A | Discharge status: Home / Self Care | Payer: 59 | Attending: Emergency Medicine | Admitting: Emergency Medicine

## 2022-11-15 ENCOUNTER — Emergency Department: Payer: 59

## 2022-11-15 ENCOUNTER — Other Ambulatory Visit: Payer: Self-pay

## 2022-11-15 DIAGNOSIS — F458 Other somatoform disorders: Secondary | ICD-10-CM | POA: Diagnosis not present

## 2022-11-15 DIAGNOSIS — R1310 Dysphagia, unspecified: Secondary | ICD-10-CM | POA: Diagnosis present

## 2022-11-15 DIAGNOSIS — R09A2 Foreign body sensation, throat: Secondary | ICD-10-CM

## 2022-11-15 MED ORDER — OMEPRAZOLE MAGNESIUM 20 MG PO TBEC: 20.0000 mg | DELAYED_RELEASE_TABLET | Freq: Every day | ORAL | 0 refills | Status: DC

## 2022-11-15 NOTE — Discharge Instructions (Signed)
Please follow-up with one of our gastroenterologists.  Please call on Friday to schedule an appointment.  In the interim, please return to the emergency room right away if you begin vomiting, cannot swallow food, have a fever, develop chest pain, difficulty breathing or other concerns arise.

## 2022-11-15 NOTE — ED Triage Notes (Signed)
Patient arrives ambulatory by POV c/o piece of chicken stuck in her throat since last night. Maintaining own saliva. States unable to eat solids since.

## 2022-11-15 NOTE — ED Notes (Signed)
Pt states that last night she was eating chicken and she has piece stuck in her throat. Pt states that she is able to swallow water but is unable to eat anything.

## 2022-11-15 NOTE — ED Provider Notes (Signed)
[facility] Provider Note    Event Date/Time   First MD Initiated Contact with Patient 11/15/22 1259     (approximate)   History   Food Stuck in Throat   HPI  Jennifer Byrd is a 26 y.o. female who last night was eating chicken with small bones in it.  After she swallowed a piece she noticed a feeling like something was stuck.  She points right at the middle of her thyroid cartilage.  She is able to drink fluids drink soda drink water.  This morning she ate a [employer] breakfast sandwich, and it felt like it sort of got stuck going down.  She had to spit some of it it.  She reports soft things and fluids go down okay though.  No problem drinking water.  Review of primary care note from August denotes patient with a history of vaginal discharge and itching.  Also has a history of marijuana use, depression, irritable bowel, anxiety, GERD without esophagitis   No abdominal pain.  No fevers or chills.  No trouble breathing.  Does not feel like anything when her lungs  Denies pregnancy.  Reports she has an IUD  Physical Exam   Triage Vital Signs: ED Triage Vitals  Enc Vitals Group     BP 11/15/22 1256 (!) 128/94     Pulse Rate 11/15/22 1256 78     Resp 11/15/22 1256 16     Temp 11/15/22 1256 98.3 F (36.8 C)     Temp Source 11/15/22 1256 Oral     SpO2 11/15/22 1256 100 %     Weight 11/15/22 1257 132 lb (59.9 kg)     Height 11/15/22 1257 5\' 6"  (1.676 m)     Head Circumference --      Peak Flow --      Pain Score 11/15/22 1256 2     Pain Loc --      Pain Edu? --      Excl. in GC? --     Most recent vital signs: Vitals:   11/15/22 1256  BP: (!) 128/94  Pulse: 78  Resp: 16  Temp: 98.3 F (36.8 C)  SpO2: 100%     General: Awake, no distress.  Posterior oropharynx is widely patent.  No lesions or injection.  Normal tonsils.  No stridor.  Anterior neck is soft nontender not no edema CV:  Good peripheral perfusion.  Heart  tones Resp:  Normal effort.  Clear bilaterally Abd:  No distention.  Soft nontender nondistended Other:       ED Results / Procedures / Treatments   Labs (all labs ordered are listed, but only abnormal results are displayed) Labs Reviewed - No data to display   EKG     RADIOLOGY   I personally interpreted the patient's soft tissue neck for acute gross pathology, I do not see evidence of acute airway foreign body or obvious foreign body on imaging   CT Soft Tissue Neck Wo Contrast  Result Date: 11/15/2022 CLINICAL DATA:  Provided history: Foreign body suspected, neck, negative x-ray. Globus sensation after swallowing a chicken bone. EXAM: CT NECK WITHOUT CONTRAST TECHNIQUE: Multidetector CT imaging of the neck was performed following the standard protocol without intravenous contrast. RADIATION DOSE REDUCTION: This exam was performed according to the departmental dose-optimization program which includes automated exposure control, adjustment of the mA and/or kV according to patient size and/or use of iterative reconstruction technique. COMPARISON:  None. FINDINGS: Pharynx and larynx:  No appreciable swelling or mass within the oral cavity, pharynx or larynx. No radiopaque foreign body is identified within the imaged aerodigestive tract. Salivary glands: Unremarkable. Thyroid: Unremarkable. Lymph nodes: No pathologically enlarged lymph nodes are identified within the neck. Vascular: The major vascular structures of the neck are incompletely assessed in the absence of intravenous contrast. No unexpected finding. Limited intracranial: No evidence of acute intracranial abnormality within the field of view. Visualized orbits: No orbital mass or acute orbital finding. Mastoids and visualized paranasal sinuses: No significant paranasal sinus disease or mastoid effusion. Skeleton: No acute fracture or aggressive osseous lesion. Upper chest: No consolidation within the imaged lung apices.  IMPRESSION: No radiopaque foreign body identified within the imaged aerodigestive tract. No appreciable swelling or mass within the oral cavity, pharynx or larynx. Electronically Signed   By: Jackey Loge D.O.   On: 11/15/2022 13:43      PROCEDURES:  Critical Care performed: No  Procedures   MEDICATIONS ORDERED IN ED: Medications - No data to display   IMPRESSION / MDM / ASSESSMENT AND PLAN / ED COURSE  I reviewed the triage vital signs and the nursing notes.                              Differential diagnosis includes, but is not limited to, globus sensation, possible abrasion or injury from swallowing a chicken bone, esophageal irritation or abrasion, obstruction etc.  Based on her clinical examination and her ability to swallow fluids I did see no evidence to support an acute esophageal obstruction, but she certainly seems to have a globus sensation and I do not see evidence of chicken bone on visual inspection, but will utilize CT to evaluate for potential foreign body in the neck.  I suspect this is more likely a globus sensation and she likely has an abrasion from eating the chicken bone.  She does report a previous endoscopy in [place] about 2 years ago she reports she was told she had acid reflux but no blockages or strictures on that.    Patient's presentation is most consistent with acute complicated illness / injury requiring diagnostic workup.    ----------------------------------------- 1:41 PM on 11/15/2022 ----------------------------------------- Patient has been able to drink 2 cans of ginger ale and eat 1 cup of her own yogurt without difficulty.  Return precautions and treatment recommendations and follow-up discussed with the patient who is agreeable with the plan.   FINAL CLINICAL IMPRESSION(S) / ED DIAGNOSES   Final diagnoses:  Globus sensation     Rx / DC Orders   ED Discharge Orders     None        Note:  This document was prepared using  Dragon voice recognition software and may include unintentional dictation errors.   Sharyn Creamer, MD 11/15/22 1348

## 2022-11-21 NOTE — Progress Notes (Unsigned)
   There were no vitals taken for this visit.   Subjective:    Patient ID: Jennifer Byrd, female    DOB: 1996-07-02, 26 y.o.   MRN: [id number]  HPI: Jennifer Byrd is a 27 y.o. female  No chief complaint on file.  Vaginal discharge:  Relevant past medical, surgical, family and social history reviewed and updated as indicated. Interim medical history since our last visit reviewed. Allergies and medications reviewed and updated.  Review of Systems  Constitutional: Negative for fever or weight change.  Respiratory: Negative for cough and shortness of breath.   Cardiovascular: Negative for chest pain or palpitations.  Gastrointestinal: Negative for abdominal pain, no bowel changes.  Musculoskeletal: Negative for gait problem or joint swelling.  Skin: Negative for rash.  Neurological: Negative for dizziness or headache.  No other specific complaints in a complete review of systems (except as listed in HPI above).      Objective:    There were no vitals taken for this visit.  Wt Readings from Last 3 Encounters:  11/15/22 132 lb (59.9 kg)  10/30/22 132 lb 6.4 oz (60.1 kg)  08/17/22 132 lb (59.9 kg)    Physical Exam  Constitutional: Patient appears well-developed and well-nourished. Obese *** No distress.  HEENT: head atraumatic, normocephalic, pupils equal and reactive to light, ears ***, neck supple, throat within normal limits Cardiovascular: Normal rate, regular rhythm and normal heart sounds.  No murmur heard. No BLE edema. Pulmonary/Chest: Effort normal and breath sounds normal. No respiratory distress. Abdominal: Soft.  There is no tenderness. Psychiatric: Patient has a normal mood and affect. behavior is normal. Judgment and thought content normal.  Results for orders placed or performed in visit on 10/30/22  Cervicovaginal ancillary only  Result Value Ref Range   Neisseria Gonorrhea Negative    Chlamydia Negative    Trichomonas Negative    Bacterial  Vaginitis (gardnerella) Negative    Candida Vaginitis Negative    Candida Glabrata Negative    Comment      Normal Reference Range Bacterial Vaginosis - Negative   Comment Normal Reference Range Candida Species - Negative    Comment Normal Reference Range Candida Galbrata - Negative    Comment Normal Reference Range Trichomonas - Negative    Comment Normal Reference Ranger Chlamydia - Negative    Comment      Normal Reference Range Neisseria Gonorrhea - Negative      Assessment & Plan:   Problem List Items Addressed This Visit   None    Follow up plan: No follow-ups on file.

## 2022-11-22 ENCOUNTER — Encounter: Payer: Self-pay | Admitting: Nurse Practitioner

## 2022-11-22 ENCOUNTER — Other Ambulatory Visit (HOSPITAL_COMMUNITY)
Admission: RE | Admit: 2022-11-22 | Discharge: 2022-11-22 | Disposition: A | Discharge status: Home / Self Care | Payer: 59 | Source: Ambulatory Visit | Attending: Family Medicine | Admitting: Family Medicine

## 2022-11-22 ENCOUNTER — Ambulatory Visit (INDEPENDENT_AMBULATORY_CARE_PROVIDER_SITE_OTHER): Payer: 59 | Admitting: Nurse Practitioner

## 2022-11-22 ENCOUNTER — Other Ambulatory Visit: Payer: Self-pay

## 2022-11-22 VITALS — BP 110/68 | HR 84 | Temp 98.2°F | Resp 16 | Ht 66.0 in | Wt 132.7 lb

## 2022-11-22 DIAGNOSIS — N898 Other specified noninflammatory disorders of vagina: Secondary | ICD-10-CM | POA: Insufficient documentation

## 2022-11-23 ENCOUNTER — Other Ambulatory Visit: Payer: Self-pay | Admitting: Nurse Practitioner

## 2022-11-23 DIAGNOSIS — B379 Candidiasis, unspecified: Secondary | ICD-10-CM

## 2022-11-23 LAB — CERVICOVAGINAL ANCILLARY ONLY
Bacterial Vaginitis (gardnerella): NEGATIVE
Candida Glabrata: NEGATIVE
Candida Vaginitis: POSITIVE — AB
Chlamydia: NEGATIVE
Comment: NEGATIVE
Comment: NEGATIVE
Comment: NEGATIVE
Comment: NEGATIVE
Comment: NEGATIVE
Comment: NORMAL
Neisseria Gonorrhea: NEGATIVE
Trichomonas: NEGATIVE

## 2022-11-23 MED ORDER — FLUCONAZOLE 150 MG PO TABS: 150.0000 mg | ORAL_TABLET | ORAL | 0 refills | Status: DC | PRN

## 2022-11-23 MED ORDER — MICONAZOLE NITRATE 100 MG VA SUPP: 100.0000 mg | Freq: Every day | VAGINAL | 0 refills | Status: DC

## 2022-12-02 ENCOUNTER — Encounter: Payer: Self-pay | Admitting: Nurse Practitioner

## 2022-12-03 ENCOUNTER — Telehealth: Payer: Self-pay | Admitting: *Deleted

## 2022-12-03 ENCOUNTER — Ambulatory Visit (INDEPENDENT_AMBULATORY_CARE_PROVIDER_SITE_OTHER): Payer: 59 | Admitting: Nurse Practitioner

## 2022-12-03 ENCOUNTER — Encounter: Payer: Self-pay | Admitting: Nurse Practitioner

## 2022-12-03 ENCOUNTER — Other Ambulatory Visit: Payer: Self-pay

## 2022-12-03 VITALS — BP 116/68 | HR 76 | Temp 98.4°F | Resp 18 | Ht 66.0 in | Wt 132.6 lb

## 2022-12-03 DIAGNOSIS — R1084 Generalized abdominal pain: Secondary | ICD-10-CM | POA: Diagnosis not present

## 2022-12-03 DIAGNOSIS — D72819 Decreased white blood cell count, unspecified: Secondary | ICD-10-CM | POA: Diagnosis not present

## 2022-12-03 DIAGNOSIS — K5900 Constipation, unspecified: Secondary | ICD-10-CM

## 2022-12-03 LAB — CBC WITH DIFFERENTIAL/PLATELET
Absolute Monocytes: 378 cells/uL (ref 200–950)
Basophils Absolute: 40 cells/uL (ref 0–200)
Basophils Relative: 0.9 %
Eosinophils Absolute: 141 cells/uL (ref 15–500)
Eosinophils Relative: 3.2 %
HCT: 40.6 % (ref 35.0–45.0)
Hemoglobin: 13.1 g/dL (ref 11.7–15.5)
Lymphs Abs: 1940 cells/uL (ref 850–3900)
MCH: 26 pg — ABNORMAL LOW (ref 27.0–33.0)
MCHC: 32.3 g/dL (ref 32.0–36.0)
MCV: 80.6 fL (ref 80.0–100.0)
MPV: 11.9 fL (ref 7.5–12.5)
Monocytes Relative: 8.6 %
Neutro Abs: 1901 cells/uL (ref 1500–7800)
Neutrophils Relative %: 43.2 %
Platelets: 226 10*3/uL (ref 140–400)
RBC: 5.04 10*6/uL (ref 3.80–5.10)
RDW: 14.9 % (ref 11.0–15.0)
Total Lymphocyte: 44.1 %
WBC: 4.4 10*3/uL (ref 3.8–10.8)

## 2022-12-03 NOTE — Telephone Encounter (Signed)
Patient would like to schedule appointment.

## 2022-12-03 NOTE — Progress Notes (Signed)
BP 116/68   Pulse 76   Temp 98.4 F (36.9 C) (Oral)   Resp 18   Ht 5\' 6"  (1.676 m)   Wt 132 lb 9.6 oz (60.1 kg)   SpO2 98%   BMI 21.40 kg/m    Subjective:    Patient ID: , female    DOB: 12-Sep-1996, 26 y.o.   MRN: [id number]  HPI: Jennifer Byrd is a 26 y.o. female  Chief Complaint  Patient presents with   Follow-up    WBC, seen at [facility]    Urgent care follow up/abdominal pain/leukopenia: patient was seen at urgent care on 11/26/2022 for abdominal pain.  She was diagnosed with constipation.  She was instructed to use miralax, and follow up with GI. She is currently seeing GI at [facility].  She has an appointment in March.  Patient reports her abdominal pain is much better and she is not having bowel movements. Patient is very concerned regarding a low WBC count on her CBC that was performed at urgent care.  WBC was 3.3. Her neutrophils, lymphocytes, monocytes were all within normal range.  Will get repeat cbc.  She has previously seen Dr. April hematology for iron deficiency anemia. Last seen in 2017.   Relevant past medical, surgical, family and social history reviewed and updated as indicated. Interim medical history since our last visit reviewed. Allergies and medications reviewed and updated.  Review of Systems  Constitutional: Negative for fever or weight change.  Respiratory: Negative for cough and shortness of breath.   Cardiovascular: Negative for chest pain or palpitations.  Gastrointestinal: Negative for abdominal pain, no bowel changes.  Musculoskeletal: Negative for gait problem or joint swelling.  Skin: Negative for rash.  Neurological: Negative for dizziness or headache.  No other specific complaints in a complete review of systems (except as listed in HPI above).      Objective:    BP 116/68   Pulse 76   Temp 98.4 F (36.9 C) (Oral)   Resp 18   Ht 5\' 6"  (1.676 m)   Wt 132 lb 9.6 oz (60.1 kg)   SpO2 98%   BMI  21.40 kg/m   Wt Readings from Last 3 Encounters:  12/03/22 132 lb 9.6 oz (60.1 kg)  11/22/22 132 lb 11.2 oz (60.2 kg)  11/15/22 132 lb (59.9 kg)    Physical Exam  Constitutional: Patient appears well-developed and well-nourished.  No distress.  HEENT: head atraumatic, normocephalic, pupils equal and reactive to light, neck supple, throat within normal limits, no lymphadenopathy Cardiovascular: Normal rate, regular rhythm and normal heart sounds.  No murmur heard. No BLE edema. Pulmonary/Chest: Effort normal and breath sounds normal. No respiratory distress. Abdominal: Soft.  There is no tenderness. Psychiatric: Patient has a normal mood and affect. behavior is normal. Judgment and thought content normal.  Results for orders placed or performed in visit on 11/22/22  Cervicovaginal ancillary only  Result Value Ref Range   Neisseria Gonorrhea Negative    Chlamydia Negative    Trichomonas Negative    Bacterial Vaginitis (gardnerella) Negative    Candida Vaginitis Positive (A)    Candida Glabrata Negative    Comment      Normal Reference Range Bacterial Vaginosis - Negative   Comment Normal Reference Ranger Chlamydia - Negative    Comment      Normal Reference Range Neisseria Gonorrhea - Negative   Comment Normal Reference Range Candida Species - Negative    Comment Normal  Reference Range Candida Galbrata - Negative    Comment Normal Reference Range Trichomonas - Negative       Assessment & Plan:   Problem List Items Addressed This Visit   None Visit Diagnoses     Leukopenia, unspecified type    -  Primary   will repeat cbc   Relevant Orders   CBC with Differential/Platelet   Generalized abdominal pain       improved, following up with GI in march   Constipation, unspecified constipation type       improved, following up with GI in march        Follow up plan: Return if symptoms worsen or fail to improve.

## 2022-12-04 ENCOUNTER — Encounter: Payer: Self-pay | Admitting: Nurse Practitioner

## 2022-12-16 ENCOUNTER — Emergency Department
Admission: EM | Admit: 2022-12-16 | Discharge: 2022-12-16 | Discharge status: Against Medical Advice | Payer: 59 | Source: Home / Self Care

## 2022-12-27 ENCOUNTER — Ambulatory Visit: Payer: Medicaid Other | Admitting: Gastroenterology

## 2022-12-29 ENCOUNTER — Encounter: Payer: Self-pay | Admitting: Nurse Practitioner

## 2022-12-29 ENCOUNTER — Ambulatory Visit
Admission: EM | Admit: 2022-12-29 | Discharge: 2022-12-29 | Disposition: A | Discharge status: Home / Self Care | Payer: BC Managed Care – PPO | Attending: Family Medicine | Admitting: Family Medicine

## 2022-12-29 ENCOUNTER — Encounter: Payer: Self-pay | Admitting: Oncology

## 2022-12-29 DIAGNOSIS — B9689 Other specified bacterial agents as the cause of diseases classified elsewhere: Secondary | ICD-10-CM | POA: Diagnosis not present

## 2022-12-29 DIAGNOSIS — N760 Acute vaginitis: Secondary | ICD-10-CM | POA: Diagnosis not present

## 2022-12-29 LAB — WET PREP, GENITAL
Sperm: NONE SEEN
Trich, Wet Prep: NONE SEEN
WBC, Wet Prep HPF POC: <10 — AB (ref <10)
Yeast Wet Prep HPF POC: NONE SEEN

## 2022-12-29 MED ORDER — METRONIDAZOLE 0.75 % VA GEL: 1.0000 | Freq: Every day | VAGINAL | 0 refills | Status: AC
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
## 2022-12-29 NOTE — ED Triage Notes (Signed)
Pt c/o vaginal discharge x2days  Pt believes it could be BV.  Pt has had new partners and had unprotected sex and would like a check for STDS.

## 2022-12-30 ENCOUNTER — Ambulatory Visit: Payer: Self-pay

## 2022-12-31 ENCOUNTER — Ambulatory Visit: Payer: 59 | Admitting: Nurse Practitioner

## 2022-12-31 LAB — CERVICOVAGINAL ANCILLARY ONLY
Chlamydia: NEGATIVE
Comment: NEGATIVE
Comment: NEGATIVE
Comment: NORMAL
Neisseria Gonorrhea: NEGATIVE
Trichomonas: NEGATIVE

## 2022-12-31 NOTE — Progress Notes (Deleted)
   LMP  (LMP Unknown)    Subjective:    Patient ID: Jennifer Byrd, female    DOB: 09-07-1996, 27 y.o.   MRN: [id number]  HPI: Jennifer Byrd is a 27 y.o. female  No chief complaint on file.   Relevant past medical, surgical, family and social history reviewed and updated as indicated. Interim medical history since our last visit reviewed. Allergies and medications reviewed and updated.  Review of Systems  Constitutional: Negative for fever or weight change.  Respiratory: Negative for cough and shortness of breath.   Cardiovascular: Negative for chest pain or palpitations.  Gastrointestinal: Negative for abdominal pain, no bowel changes.  Musculoskeletal: Negative for gait problem or joint swelling.  Skin: Negative for rash.  Neurological: Negative for dizziness or headache.  No other specific complaints in a complete review of systems (except as listed in HPI above).      Objective:    LMP  (LMP Unknown)   Wt Readings from Last 3 Encounters:  12/29/22 130 lb (59 kg)  12/03/22 132 lb 9.6 oz (60.1 kg)  11/22/22 132 lb 11.2 oz (60.2 kg)    Physical Exam  Constitutional: Patient appears well-developed and well-nourished. Obese *** No distress.  HEENT: head atraumatic, normocephalic, pupils equal and reactive to light, ears ***, neck supple, throat within normal limits Cardiovascular: Normal rate, regular rhythm and normal heart sounds.  No murmur heard. No BLE edema. Pulmonary/Chest: Effort normal and breath sounds normal. No respiratory distress. Abdominal: Soft.  There is no tenderness. Psychiatric: Patient has a normal mood and affect. behavior is normal. Judgment and thought content normal.      Assessment & Plan:   Problem List Items Addressed This Visit   None    Follow up plan: No follow-ups on file.

## 2023-01-03 ENCOUNTER — Ambulatory Visit
Admission: EM | Admit: 2023-01-03 | Discharge: 2023-01-03 | Disposition: A | Discharge status: Home / Self Care | Payer: BC Managed Care – PPO | Attending: Urgent Care | Admitting: Urgent Care

## 2023-01-03 DIAGNOSIS — J029 Acute pharyngitis, unspecified: Secondary | ICD-10-CM | POA: Diagnosis not present

## 2023-01-03 NOTE — Discharge Instructions (Addendum)
Follow up here or with your primary care provider if your symptoms are worsening or not improving.     

## 2023-01-03 NOTE — ED Triage Notes (Signed)
Pt. Presents to UC w/a sore throat for the past 5 days.
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
## 2023-01-07 ENCOUNTER — Telehealth: Payer: Self-pay

## 2023-01-07 NOTE — Telephone Encounter (Signed)
Patient called and stated she was given MetroGel for a BV infection from urgent care on 10/02/2023 and she lost it over the weekend. She contacted them and they stated they could not give her a replacement and she would like to know if you would be able to give her a new prescription or what would you recommend she do?

## 2023-01-08 NOTE — Telephone Encounter (Signed)
Appt sch'd with Erin for Monday 

## 2023-01-09 ENCOUNTER — Ambulatory Visit: Payer: 59 | Admitting: Physician Assistant

## 2023-01-14 ENCOUNTER — Ambulatory Visit: Payer: 59 | Admitting: Physician Assistant

## 2023-01-31 ENCOUNTER — Encounter: Payer: Self-pay | Admitting: Family Medicine

## 2023-02-04 ENCOUNTER — Encounter (HOSPITAL_COMMUNITY): Payer: Self-pay

## 2023-02-04 ENCOUNTER — Emergency Department (HOSPITAL_COMMUNITY)
Admission: EM | Admit: 2023-02-04 | Discharge: 2023-02-04 | Disposition: A | Discharge status: Home / Self Care | Payer: BC Managed Care – PPO | Attending: Emergency Medicine | Admitting: Emergency Medicine

## 2023-02-04 ENCOUNTER — Other Ambulatory Visit: Payer: Self-pay

## 2023-02-04 ENCOUNTER — Encounter: Payer: Self-pay | Admitting: Oncology

## 2023-02-04 ENCOUNTER — Encounter: Payer: Self-pay | Admitting: Nurse Practitioner

## 2023-02-04 ENCOUNTER — Ambulatory Visit: Payer: Self-pay

## 2023-02-04 DIAGNOSIS — Z79899 Other long term (current) drug therapy: Secondary | ICD-10-CM

## 2023-02-04 DIAGNOSIS — Z760 Encounter for issue of repeat prescription: Secondary | ICD-10-CM | POA: Insufficient documentation

## 2023-02-04 MED ORDER — DOXYCYCLINE HYCLATE 100 MG PO TABS
100.0000 mg | ORAL_TABLET | Freq: Once | ORAL | Status: AC
  Administered 2023-02-04: 100 mg via ORAL
  Filled 2023-02-04: qty 1

## 2023-02-04 NOTE — Telephone Encounter (Signed)
  Chief Complaint: Medication questions.  Symptoms: Vaginal bacteria Frequency: ongoing Pertinent Negatives: Patient denies  Disposition: [] ED /[] Urgent Care (no appt availability in office) / [] Appointment(In office/virtual)/ []  [employer] [facility]/ [] Home Care/ [] Refused Recommended Disposition /[] [place] [facility]/ [x]  Follow-up with PCP Additional Notes: Pt was seen at GYN office and was found to have a vaginal bacteria. PT was given an antibiotic to take. Pt researched medication and saw a black box warning for this medication. Pt does not want to take the medication. Per pt she could also take doxycyline for this infection. And per pt her chart is flagged as having an allergy to that antibiotic. Pt states she was given that medicine in the hospital and there were no problems.   PT will call GYN and also send a MyChart to discuss concerns.     Reason for Disposition  [1] Caller requesting NON-URGENT health information AND [2] PCP's office is the best resource  Answer Assessment - Initial Assessment Questions 1. REASON FOR CALL or QUESTION: "What is your reason for calling today?" or "How can I best help you?" or "What question do you have that I can help answer?"     Pt is uncomfortable taking a medication prescribed by her gyn.  Protocols used: Information Only Call - No Triage-A-AH

## 2023-02-04 NOTE — ED Provider Notes (Signed)
[facility] Provider Note   CSN: [id number] Arrival date & time: 02/04/23  1713     History  Chief Complaint  Patient presents with   Medication Refill    Jennifer Byrd is a 27 y.o. female with medical history of acid reflux, anxiety, heart palpitations, anemia.  Patient presents to ED for evaluation/management of medications.  Patient reports that at some point last week she was diagnosed with Ureaplasma at OB/GYN.  Patient reports at this time she was supposed be placed on doxycycline however patient does have vague allergy to doxycycline noted as well as azithromycin which are 2 treatment options for this infection.  Patient reports that due to these, she was placed on moxifloxacin.  Patient states that when she went to pick up moxifloxacin today from pharmacy, pharmacist notified her that it does have black box warning of tendon rupture.  Patient reports that she has pre-existing meniscus injury and she is concerned due to the fact that she works out 7 days a week.  Patient here requesting to receive doxycycline and then be admitted for observation secondary to her suspected allergy.  Patient denies chest pain, shortness of breath, nausea, vomiting, throat swelling.   Medication Refill      Home Medications Prior to Admission medications   Medication Sig Start Date End Date Taking? Authorizing Provider  cetirizine (ZYRTEC) 10 MG tablet Take by mouth.    [provider]  EPINEPHrine 0.3 mg/0.3 mL IJ SOAJ injection Inject into the muscle. 12/06/20   [provider]      Allergies    Cefdinir, Peanut (diagnostic), Penicillins, Azithromycin, Doxycycline, Peanut-containing drug products, Latex, Meclizine, Soy allergy, Amoxicillin, and Amoxicillin-pot clavulanate    Review of Systems   Review of Systems  All other systems reviewed and are negative.   Physical Exam Updated Vital Signs BP 121/88   Pulse 71    Temp 98.4 F (36.9 C) (Oral)   Resp 16   Ht 5' 6"$  (1.676 m)   Wt 59 kg   SpO2 100%   BMI 20.98 kg/m  Physical Exam Vitals and nursing note reviewed.  Constitutional:      General: She is not in acute distress.    Appearance: She is well-developed.  HENT:     Head: Normocephalic and atraumatic.     Comments: Patent airway Eyes:     Conjunctiva/sclera: Conjunctivae normal.  Cardiovascular:     Rate and Rhythm: Normal rate and regular rhythm.     Heart sounds: No murmur heard. Pulmonary:     Effort: Pulmonary effort is normal. No respiratory distress.     Breath sounds: Normal breath sounds.  Abdominal:     Palpations: Abdomen is soft.     Tenderness: There is no abdominal tenderness.  Musculoskeletal:        General: No swelling.     Cervical back: Neck supple.  Skin:    General: Skin is warm and dry.     Capillary Refill: Capillary refill takes less than 2 seconds.  Neurological:     Mental Status: She is alert.  Psychiatric:        Mood and Affect: Mood normal.     ED Results / Procedures / Treatments   Labs (all labs ordered are listed, but only abnormal results are displayed) Labs Reviewed - No data to display  EKG None  Radiology No results found.  Procedures Procedures    Medications Ordered in ED Medications  doxycycline (VIBRA-TABS) tablet 100 mg (100 mg Oral Given 02/04/23 2054)    ED Course/ Medical Decision Making/ A&P  Medical Decision Making Risk Prescription drug management.   27 year old female presents to the ED for evaluation.  Please see HPI for further details.  On examination patient is afebrile and nontachycardic.  Lung sounds clear bilaterally, not hypoxic.  Abdomen soft and compressible throughout.  Neuroexam shows no focal neurodeficits.  Patient airway nonerythematous, no exudate.  Uvula midline.  Handling secretions appropriately.  Patient in no apparent distress.  Extensive counseling/discussions were had with the patient  concerning her antibiotic choices.  Patient unsure of history or reaction to the doxycycline however it is in her chart reporting throat irritation.  Patient concerned to take moxifloxacin secondary to concern for tendon rupture.  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  The patient is very concerned because she wants to be able to take antibiotics however she is concerned about tendon rupture or anaphylaxis to doxycycline.  Patient is here requesting admission for observation so she can take doxycycline.  I advised the patient that this is not a realistic solution to the problem.  I advised the patient that she will be fine to take moxifloxacin, her risk of tendon rupture is low due to her age.  The patient continued to perseverate on whether or not she should take doxycycline or moxifloxacin.  After discussion with family, patient elected to take doxycycline and be observed.  Patient provided doxycycline pill here and observed for total time period of 2 hours.  Patient showed no evidence of anaphylaxis,'s trouble breathing, hives or rash.  Patient reports she feels fine.  Patient requesting discharge at this time.  The patient will be discharged and advised to take doxycycline as previously prescribed by OB/GYN.  Patient advised to return to the ED with any new or worsening signs or symptoms.  Patient discharged in stable condition.   Final Clinical Impression(s) / ED Diagnoses Final diagnoses:  Medication management    Rx / DC Orders ED Discharge Orders     None         Lawana Chambers 02/04/23 2316    Fredia Sorrow, MD 02/07/23 1932

## 2023-02-04 NOTE — ED Provider Triage Note (Signed)
Emergency Medicine Provider Triage Evaluation Note  Jelena Elmyra Ricks Ziolkowski , a 27 y.o. female  was evaluated in triage.  Pt complains of needing antibiotics in a healthcare environment.  States she has had multiple drug like reactions to antibiotics over the years.  Seen several times by OB/GYN recently for vaginal discharge, was thought to be BV, however repetitively tested negative for BV.  Recent culture tested positive for Ureaplasma.  Patient requesting doxycycline for Ureaplasma.  Hx of beta thalassemia, anxiety, IBS, PTSD, marijuana use, GERD, depression.  Review of Systems  Positive:  Negative: See above  Physical Exam  There were no vitals taken for this visit. Gen:   Awake, no distress   Resp:  Normal effort  MSK:   Moves extremities without difficulty  Other:  Mildly anxious appearing.  Sitting comfortably.  Medical Decision Making  Medically screening exam initiated at 5:21 PM.  Appropriate orders placed.  Jisele Elmyra Ricks Bolen was informed that the remainder of the evaluation will be completed by another provider, this initial triage assessment does not replace that evaluation, and the importance of remaining in the ED until their evaluation is complete.     Prince Rome, PA-C [id number]

## 2023-02-04 NOTE — ED Triage Notes (Addendum)
Pt reports she was seen at GYN office and was found to have a vaginal bacteria (Ureaplasma) and was prescribed an antibiotic to take but she researched that medication and saw a black box warning for this medication. Pt does not want to take it. Per pt she could also take doxycyline for this infection. And per pt her chart is flagged as having an allergy to that antibiotic. Pt is requesting to be monitored in the hospital when taking Doxycycline.

## 2023-02-04 NOTE — Discharge Instructions (Signed)
Return to the ED with any new or worsening signs or symptoms Please follow-up with OB/GYN for further management of Ureaplasma Please begin taking doxycycline as prescribed by OB/GYN If in the event you develop any allergic reaction to doxycycline please take Benadryl and call [phone]

## 2023-02-05 ENCOUNTER — Ambulatory Visit: Payer: 59 | Admitting: Internal Medicine

## 2023-02-05 ENCOUNTER — Encounter: Payer: Self-pay | Admitting: Internal Medicine

## 2023-02-05 ENCOUNTER — Other Ambulatory Visit (HOSPITAL_COMMUNITY)
Admission: RE | Admit: 2023-02-05 | Discharge: 2023-02-05 | Disposition: A | Discharge status: Home / Self Care | Payer: 59 | Source: Ambulatory Visit | Attending: Internal Medicine | Admitting: Internal Medicine

## 2023-02-05 ENCOUNTER — Ambulatory Visit: Payer: 59 | Admitting: Nurse Practitioner

## 2023-02-05 VITALS — BP 122/82 | HR 94 | Temp 97.4°F | Resp 16 | Ht 66.0 in | Wt 129.2 lb

## 2023-02-05 DIAGNOSIS — N898 Other specified noninflammatory disorders of vagina: Secondary | ICD-10-CM | POA: Diagnosis present

## 2023-02-05 DIAGNOSIS — B379 Candidiasis, unspecified: Secondary | ICD-10-CM | POA: Diagnosis not present

## 2023-02-05 DIAGNOSIS — Z79899 Other long term (current) drug therapy: Secondary | ICD-10-CM

## 2023-02-05 LAB — POCT URINALYSIS DIPSTICK
Bilirubin, UA: NEGATIVE
Blood, UA: NEGATIVE
Glucose, UA: NEGATIVE
Ketones, UA: NEGATIVE
Leukocytes, UA: NEGATIVE
Nitrite, UA: NEGATIVE
Odor: NORMAL
Protein, UA: NEGATIVE
Spec Grav, UA: 1.015 (ref 1.010–1.025)
Urobilinogen, UA: 0.2 E.U./dL
pH, UA: 5.5 (ref 5.0–8.0)

## 2023-02-05 NOTE — Progress Notes (Signed)
   Acute Office Visit  Subjective:     Patient ID: Jennifer Byrd, female    DOB: Sep 11, 1996, 27 y.o.   MRN: [id number]  Chief Complaint  Patient presents with   Follow-up    HPI Patient is in today for questions about recent ureaplasma infection and antibiotics. She was seen at Gynecology the beginning of February to be tested for vaginal infections. She had been treated for BV in January, however she tested positive for ureaplasma recently. She was prescribed Doxycycline, of which she took the first dose yesterday in the ER (was anxious about having an allergic reaction and wanted to be monitored). She had no immediate issue and was discharged home. Today she states that she has some throat irritation and dry throat and is worried because she's had this same reaction to Doxycycline in the past. She also has allergies to Azithromycin, which causes hives. She was then prescribed moxifloxacin but the patient is concerned about tendon ruptures associated with this medication. As of now she has not been treated although she has no symptoms of infection, no vaginal discharge, pain or urinary symptoms.    Review of Systems  Constitutional:  Negative for chills and fever.  HENT:  Positive for sore throat.   Genitourinary:  Negative for dysuria, flank pain, frequency, hematuria and urgency.        Objective:    BP 122/82   Pulse 94   Temp (!) 97.4 F (36.3 C)   Resp 16   Ht 5\' 6"  (1.676 m)   Wt 129 lb 3.2 oz (58.6 kg)   SpO2 100%   BMI 20.85 kg/m  BP Readings from Last 3 Encounters:  02/05/23 122/82  02/04/23 115/79  01/03/23 110/74   Wt Readings from Last 3 Encounters:  02/05/23 129 lb 3.2 oz (58.6 kg)  02/04/23 130 lb (59 kg)  12/29/22 130 lb (59 kg)      Physical Exam Constitutional:      Appearance: Normal appearance.  HENT:     Head: Normocephalic and atraumatic.     Mouth/Throat:     Mouth: Mucous membranes are moist.     Comments: Mild PND present Eyes:      Conjunctiva/sclera: Conjunctivae normal.  Cardiovascular:     Rate and Rhythm: Normal rate and regular rhythm.  Pulmonary:     Effort: Pulmonary effort is normal.     Breath sounds: Normal breath sounds.  Skin:    General: Skin is warm and dry.  Neurological:     General: No focal deficit present.     Mental Status: She is alert. Mental status is at baseline.  Psychiatric:        Mood and Affect: Mood normal.        Behavior: Behavior normal.     No results found for any visits on 02/05/23.      Assessment & Plan:   1. Vaginal discharge/Medication management: Patient not wanting to take antibiotics, will re-test today. UA negative for signs of UTI.  - Cervicovaginal ancillary only - POCT Urinalysis Dipstick - SureSwab Ureaplasma species, PCR   Patient expressed understanding and is in agreement with plan as outlined above. All questions were answered. Today, I spent 25 minutes on patient care, >50% of time was spent face-to-face with the patient.   Return if symptoms worsen or fail to improve.  Teodora Medici, DO

## 2023-02-06 ENCOUNTER — Ambulatory Visit: Payer: 59 | Admitting: Nurse Practitioner

## 2023-02-07 ENCOUNTER — Encounter: Payer: Self-pay | Admitting: Internal Medicine

## 2023-02-07 LAB — CERVICOVAGINAL ANCILLARY ONLY
Bacterial Vaginitis (gardnerella): NEGATIVE
Candida Glabrata: NEGATIVE
Candida Vaginitis: POSITIVE — AB
Chlamydia: NEGATIVE
Comment: NEGATIVE
Comment: NEGATIVE
Comment: NEGATIVE
Comment: NEGATIVE
Comment: NEGATIVE
Comment: NORMAL
Neisseria Gonorrhea: NEGATIVE
Trichomonas: NEGATIVE

## 2023-02-08 MED ORDER — FLUCONAZOLE 150 MG PO TABS: 150.0000 mg | ORAL_TABLET | Freq: Once | ORAL | 0 refills | Status: AC

## 2023-02-08 NOTE — Addendum Note (Signed)
Addended by: Teodora Medici on: 02/08/2023 09:10 AM   Modules accepted: Orders

## 2023-02-11 LAB — SURESWAB UREAPLASMA SPECIES, PCR
U. parvum DNA: DETECTED — AB
U. urealyticum DNA: NOT DETECTED

## 2023-02-13 ENCOUNTER — Other Ambulatory Visit: Payer: Self-pay | Admitting: Internal Medicine

## 2023-02-13 DIAGNOSIS — Z2239 Carrier of other specified bacterial diseases: Secondary | ICD-10-CM

## 2023-02-13 MED ORDER — DOXYCYCLINE MONOHYDRATE 100 MG PO CAPS: 100.0000 mg | ORAL_CAPSULE | Freq: Two times a day (BID) | ORAL | 0 refills | Status: AC

## 2023-02-28 ENCOUNTER — Encounter: Payer: Self-pay | Admitting: Oncology

## 2023-03-04 ENCOUNTER — Encounter: Payer: Self-pay | Admitting: Oncology

## 2023-03-05 ENCOUNTER — Encounter: Payer: Self-pay | Admitting: Oncology

## 2023-03-31 ENCOUNTER — Encounter: Payer: Self-pay | Admitting: Oncology

## 2023-04-01 ENCOUNTER — Ambulatory Visit: Payer: Self-pay | Admitting: Physician Assistant

## 2023-04-01 NOTE — Progress Notes (Deleted)
          Acute Office Visit   Patient: Jennifer Byrd   DOB: Apr 02, 1996   27 y.o. Female  MRN: [id number] Visit Date: 04/01/2023  Today's healthcare provider: Oswaldo Conroy Lety Cullens, PA-C  Introduced myself to the patient as a Secondary school teacher and provided education on APPs in clinical practice.    No chief complaint on file.  Subjective    HPI    Medications: Outpatient Medications Prior to Visit  Medication Sig   cetirizine (ZYRTEC) 10 MG tablet Take by mouth.   EPINEPHrine 0.3 mg/0.3 mL IJ SOAJ injection Inject into the muscle.   No facility-administered medications prior to visit.    Review of Systems  {Labs  Heme  Chem  Endocrine  Serology  Results Review (optional):23779}   Objective    There were no vitals taken for this visit. {Show previous vital signs (optional):23777}  Physical Exam    No results found for any visits on 04/01/23.  Assessment & Plan      No follow-ups on file.

## 2023-04-01 NOTE — Progress Notes (Signed)
   Acute Office Visit  Subjective:     Patient ID: Jennifer Byrd, female    DOB: Jul 23, 1996, 27 y.o.   MRN: [id number]  No chief complaint on file.   HPI Patient is in today for breast mass.  Breast Pain:  -Duration :{Blank single:19197::"days","weeks","months"} -Location: {Blank single:19197::"right","left","bilateral"} -Onset: {Blank single:19197::"sudden","gradual"} -Severity: {Blank single:19197::"mild","moderate","severe","1/10","2/10","3/10","4/10","5/10","6/10","7/10","8/10","9/10","10/10"} -Quality: {Blank multiple:19196::"sharp","dull","aching","burning","cramping","ill-defined","itchy","pressure-like","pulling","shooting","sore","stabbing","tender","tearing","throbbing"} -Frequency: {Blank single:19197::"constant","intermittent","occasional","rare","every few minutes","a few times a hour","a few times a day","a few times a week","a few times a month","a few times a year"} -Redness: {Blank single:19197::"yes","no"} -Swelling: {Blank single:19197::"yes","no"} -Trauma: {Blank single:19197::"trauma","no trauma"} -Breastfeeding: {Blank single:19197::"yes","no"} -Associated with menstral cycle: {Blank single:19197::"yes","no"} -Nipple discharge: {Blank single:19197::"yes","no"} -Breast lump: {Blank single:19197::"yes","no"} -Status: {Blank multiple:19196::"better","worse","stable","fluctuating"} -Treatments attempted: {Blank multiple:19196::"none","primrose oil","ibuprofen","tylenol"} -Previous mammogram: {Blank single:19197::"yes","no"}   ROS      Objective:    There were no vitals taken for this visit. {Vitals History (Optional):23777}  Physical Exam  No results found for any visits on 04/02/23.      Assessment & Plan:   Problem List Items Addressed This Visit   None   No orders of the defined types were placed in this encounter.   No follow-ups on file.  Margarita Mail, DO

## 2023-04-02 ENCOUNTER — Ambulatory Visit (INDEPENDENT_AMBULATORY_CARE_PROVIDER_SITE_OTHER): Payer: Self-pay | Admitting: Internal Medicine

## 2023-04-02 ENCOUNTER — Encounter: Payer: Self-pay | Admitting: Oncology

## 2023-04-02 ENCOUNTER — Encounter: Payer: Self-pay | Admitting: Internal Medicine

## 2023-04-02 VITALS — BP 112/62 | HR 100 | Temp 97.6°F | Resp 16 | Ht 66.0 in | Wt 127.8 lb

## 2023-04-02 DIAGNOSIS — N6312 Unspecified lump in the right breast, upper inner quadrant: Secondary | ICD-10-CM

## 2023-04-02 NOTE — Patient Instructions (Addendum)
It was great seeing you today!  Plan discussed at today's visit: -Breast ultrasound ordered   Follow up in: as needed  Take care and let us know if you have any questions or concerns prior to your next visit.  Dr. Caralee Ates

## 2023-04-09 ENCOUNTER — Ambulatory Visit
Admission: RE | Admit: 2023-04-09 | Discharge: 2023-04-09 | Disposition: A | Discharge status: Home / Self Care | Payer: 59 | Source: Ambulatory Visit | Attending: Internal Medicine | Admitting: Internal Medicine

## 2023-04-09 ENCOUNTER — Encounter: Payer: Self-pay | Admitting: Oncology

## 2023-04-09 DIAGNOSIS — N6312 Unspecified lump in the right breast, upper inner quadrant: Secondary | ICD-10-CM | POA: Diagnosis present

## 2023-07-20 DIAGNOSIS — Z881 Allergy status to other antibiotic agents status: Secondary | ICD-10-CM | POA: Diagnosis not present

## 2023-07-20 DIAGNOSIS — S90562A Insect bite (nonvenomous), left ankle, initial encounter: Secondary | ICD-10-CM | POA: Diagnosis not present

## 2023-07-20 DIAGNOSIS — Z880 Allergy status to penicillin: Secondary | ICD-10-CM | POA: Diagnosis not present

## 2023-07-24 ENCOUNTER — Encounter: Payer: Self-pay | Admitting: Oncology

## 2023-07-29 ENCOUNTER — Encounter: Payer: MEDICAID | Admitting: Family Medicine

## 2023-09-02 DIAGNOSIS — R002 Palpitations: Secondary | ICD-10-CM | POA: Diagnosis not present

## 2023-09-02 DIAGNOSIS — R079 Chest pain, unspecified: Secondary | ICD-10-CM | POA: Diagnosis not present

## 2023-09-05 ENCOUNTER — Encounter: Payer: Self-pay | Admitting: Oncology

## 2023-09-07 DIAGNOSIS — F411 Generalized anxiety disorder: Secondary | ICD-10-CM | POA: Diagnosis not present

## 2023-09-09 DIAGNOSIS — F411 Generalized anxiety disorder: Secondary | ICD-10-CM | POA: Diagnosis not present

## 2023-09-12 DIAGNOSIS — F411 Generalized anxiety disorder: Secondary | ICD-10-CM | POA: Diagnosis not present

## 2023-09-18 ENCOUNTER — Encounter: Payer: Self-pay | Admitting: Oncology

## 2023-09-18 DIAGNOSIS — F411 Generalized anxiety disorder: Secondary | ICD-10-CM | POA: Diagnosis not present

## 2023-09-19 ENCOUNTER — Ambulatory Visit: Payer: 59

## 2023-09-19 ENCOUNTER — Encounter: Payer: Self-pay | Admitting: Oncology

## 2023-09-20 ENCOUNTER — Other Ambulatory Visit: Payer: Self-pay

## 2023-09-20 ENCOUNTER — Encounter: Payer: Self-pay | Admitting: Nurse Practitioner

## 2023-09-20 ENCOUNTER — Ambulatory Visit (INDEPENDENT_AMBULATORY_CARE_PROVIDER_SITE_OTHER): Payer: Medicaid Other | Admitting: Nurse Practitioner

## 2023-09-20 ENCOUNTER — Other Ambulatory Visit (HOSPITAL_COMMUNITY)
Admission: RE | Admit: 2023-09-20 | Discharge: 2023-09-20 | Disposition: A | Discharge status: Home / Self Care | Payer: Medicaid Other | Source: Ambulatory Visit | Attending: Nurse Practitioner | Admitting: Nurse Practitioner

## 2023-09-20 VITALS — BP 102/68 | HR 68 | Temp 98.0°F | Resp 18 | Ht 66.0 in | Wt 130.3 lb

## 2023-09-20 DIAGNOSIS — H5213 Myopia, bilateral: Secondary | ICD-10-CM | POA: Diagnosis not present

## 2023-09-20 DIAGNOSIS — N898 Other specified noninflammatory disorders of vagina: Secondary | ICD-10-CM | POA: Diagnosis not present

## 2023-09-20 DIAGNOSIS — K5904 Chronic idiopathic constipation: Secondary | ICD-10-CM | POA: Diagnosis not present

## 2023-09-20 DIAGNOSIS — R195 Other fecal abnormalities: Secondary | ICD-10-CM

## 2023-09-20 LAB — POCT URINALYSIS DIPSTICK
Bilirubin, UA: NEGATIVE
Glucose, UA: NEGATIVE
Ketones, UA: NEGATIVE
Leukocytes, UA: NEGATIVE
Nitrite, UA: NEGATIVE
Protein, UA: POSITIVE — AB
Spec Grav, UA: 1.02 (ref 1.010–1.025)
Urobilinogen, UA: 0.2 U/dL
pH, UA: 5 (ref 5.0–8.0)

## 2023-09-20 NOTE — Progress Notes (Signed)
BP 102/68   Pulse 68   Temp 98 F (36.7 C) (Oral)   Resp 18   Ht 5\' 6"  (1.676 m)   Wt 130 lb 4.8 oz (59.1 kg)   SpO2 100%   BMI 21.03 kg/m    Subjective:    Patient ID: [employer], female    DOB: 1996-02-27, 27 y.o.   MRN: [id number]  HPI: Jennifer Byrd is a 27 y.o. female  Chief Complaint  Patient presents with   Vaginal Discharge   bowel issues    Constipated, changes in color with BM   Vaginal discharge: she reports that she has had a vaginal discharge for about a week.  She denies any pain or urinary frequency.  She does  have an IUD . Will get vaginal swab.   Constipation/ change in stool:  She reports that she noticed over the last few days her stools have changed color to pale green.  She reports she has a history of IBS, not currently on medications.  She reports her last BM was yesterday.  She has not seen GI has been a while.  Discussed options to help with constipation. She says that she will increase her fiber. She would like to have her stool checked. Will get stool culture.  Relevant past medical, surgical, family and social history reviewed and updated as indicated. Interim medical history since our last visit reviewed. Allergies and medications reviewed and updated.  Review of Systems  Constitutional: Negative for fever or weight change.  Respiratory: Negative for cough and shortness of breath.   Cardiovascular: Negative for chest pain or palpitations.  Gastrointestinal: Negative for abdominal pain, bowel changes.  Musculoskeletal: Negative for gait problem or joint swelling.  Skin: Negative for rash.  Neurological: Negative for dizziness or headache.  No other specific complaints in a complete review of systems (except as listed in HPI above).      Objective:    BP 102/68   Pulse 68   Temp 98 F (36.7 C) (Oral)   Resp 18   Ht 5\' 6"  (1.676 m)   Wt 130 lb 4.8 oz (59.1 kg)   SpO2 100%   BMI 21.03 kg/m   Wt Readings from Last 3  Encounters:  09/20/23 130 lb 4.8 oz (59.1 kg)  04/02/23 127 lb 12.8 oz (58 kg)  02/05/23 129 lb 3.2 oz (58.6 kg)    Physical Exam  Constitutional: Patient appears well-developed and well-nourished. No distress.  HEENT: head atraumatic, normocephalic, pupils equal and reactive to light, neck supple Cardiovascular: Normal rate, regular rhythm and normal heart sounds.  No murmur heard. No BLE edema. Pulmonary/Chest: Effort normal and breath sounds normal. No respiratory distress. Abdominal: Soft.  There is no tenderness. Psychiatric: Patient has a normal mood and affect. behavior is normal. Judgment and thought content normal.      Assessment & Plan:   Problem List Items Addressed This Visit   None Visit Diagnoses     Vaginal discharge    -  Primary   vaginal swab collected   Relevant Orders   Cervicovaginal ancillary only   POCT urinalysis dipstick (Completed)   Chronic idiopathic constipation       increase fiber and water   Relevant Orders   Stool Giardia/Cryptosporidium   Salmonella/Shigella Cult, Campy EIA and Shiga Toxin reflex   CALPROTECTIN   Ova and parasite examination   Stool color abnormal       will get stool culture   Relevant  Orders   Stool Giardia/Cryptosporidium   Salmonella/Shigella Cult, Campy EIA and Shiga Toxin reflex   CALPROTECTIN   Ova and parasite examination        Follow up plan: Return if symptoms worsen or fail to improve.

## 2023-09-23 ENCOUNTER — Encounter: Payer: Self-pay | Admitting: Oncology

## 2023-09-24 ENCOUNTER — Encounter: Payer: Self-pay | Admitting: Nurse Practitioner

## 2023-09-24 DIAGNOSIS — F411 Generalized anxiety disorder: Secondary | ICD-10-CM | POA: Diagnosis not present

## 2023-09-24 LAB — CERVICOVAGINAL ANCILLARY ONLY
Bacterial Vaginitis (gardnerella): NEGATIVE
Candida Glabrata: NEGATIVE
Candida Vaginitis: NEGATIVE
Chlamydia: NEGATIVE
Comment: NEGATIVE
Comment: NEGATIVE
Comment: NEGATIVE
Comment: NEGATIVE
Comment: NEGATIVE
Comment: NORMAL
Neisseria Gonorrhea: NEGATIVE
Trichomonas: NEGATIVE

## 2023-09-25 DIAGNOSIS — Z113 Encounter for screening for infections with a predominantly sexual mode of transmission: Secondary | ICD-10-CM | POA: Diagnosis not present

## 2023-09-25 DIAGNOSIS — N898 Other specified noninflammatory disorders of vagina: Secondary | ICD-10-CM | POA: Diagnosis not present

## 2023-09-25 DIAGNOSIS — R809 Proteinuria, unspecified: Secondary | ICD-10-CM | POA: Diagnosis not present

## 2023-09-25 DIAGNOSIS — Z87448 Personal history of other diseases of urinary system: Secondary | ICD-10-CM | POA: Diagnosis not present

## 2023-10-08 ENCOUNTER — Encounter: Payer: Self-pay | Admitting: Family Medicine

## 2023-10-08 ENCOUNTER — Ambulatory Visit: Payer: Medicaid Other | Admitting: Family Medicine

## 2023-10-08 VITALS — BP 123/74 | HR 97 | Temp 98.1°F | Resp 16 | Ht 66.0 in | Wt 129.0 lb

## 2023-10-08 DIAGNOSIS — R110 Nausea: Secondary | ICD-10-CM

## 2023-10-08 DIAGNOSIS — R519 Headache, unspecified: Secondary | ICD-10-CM

## 2023-10-08 DIAGNOSIS — R809 Proteinuria, unspecified: Secondary | ICD-10-CM

## 2023-10-08 DIAGNOSIS — M542 Cervicalgia: Secondary | ICD-10-CM

## 2023-10-08 DIAGNOSIS — R3121 Asymptomatic microscopic hematuria: Secondary | ICD-10-CM

## 2023-10-08 MED ORDER — ONDANSETRON 4 MG PO TBDP: 4.0000 mg | ORAL_TABLET | Freq: Three times a day (TID) | ORAL | 0 refills | Status: DC | PRN

## 2023-10-08 MED ORDER — BACLOFEN 5 MG PO TABS: 5.0000 mg | ORAL_TABLET | Freq: Three times a day (TID) | ORAL | 3 refills | Status: DC | PRN

## 2023-10-08 NOTE — Patient Instructions (Signed)
Excedrine migraine over the counter and if that doesn't help try aleve or naproxen.  Let me know if you need an extended work note or if there are any new symptoms we can reevaluate you.  Tension Headache, Adult A tension headache is a feeling of pain, pressure, or aching in the head. It is often felt over the front and sides of the head. Tension headaches can last from 30 minutes to several days. What are the causes? The cause of this condition is not known. Sometimes, tension headaches are brought on by stress, worry (anxiety), or depression. Other things that may set them off include: Alcohol. Too much caffeine or caffeine withdrawal. Colds, flu, or sinus infections. Dental problems. This can include clenching your teeth. Being tired. Holding your head and neck in the same position for a long time, such as while using a computer. Smoking. Arthritis in the neck. What are the signs or symptoms? Feeling pressure around the head. A dull ache in the head. Pain over the front and sides of the head. Feeling sore or tender in the muscles of the head, neck, and shoulders. How is this treated? This condition may be treated with lifestyle changes and with medicines that help relieve symptoms. Follow these instructions at home: Managing pain Take over-the-counter and prescription medicines only as told by your doctor. When you have a headache, lie down in a dark, quiet room. If told, put ice on your head and neck. To do this: Put ice in a plastic bag. Place a towel between your skin and the bag. Leave the ice on for 20 minutes, 2-3 times a day. Take off the ice if your skin turns bright red. This is very important. If you cannot feel pain, heat, or cold, you have a greater risk of damage to the area. If told, put heat on the back of your neck. Do this as often as told by your doctor. Use the heat source that your doctor recommends, such as a moist heat pack or a heating pad. Place a towel  between your skin and the heat source. Leave the heat on for 20-30 minutes. Take off the heat if your skin turns bright red. This is very important. If you cannot feel pain, heat, or cold, you have a greater risk of getting burned. Eating and drinking Eat meals on a regular schedule. If you drink alcohol: Limit how much you have to: 0-1 drink a day for women who are not pregnant. 0-2 drinks a day for men. Know how much alcohol is in your drink. In the [place] one drink equals one 12 oz bottle of beer (355 mL), one 5 oz glass of wine (148 mL), or one 1 oz glass of hard liquor (44 mL). Drink enough fluid to keep your pee (urine) pale yellow. Do not use a lot of caffeine, or stop using caffeine. Lifestyle Get 7-9 hours of sleep each night. Or get the amount of sleep that your doctor tells you to. At bedtime, keep computers, phones, and tablets out of your room. Find ways to lessen your stress. This may include: Exercise. Deep breathing. Yoga. Listening to music. Thinking positive thoughts. Sit up straight. Try to relax your muscles. Do not smoke or use any products that contain nicotine or tobacco. If you need help quitting, ask your doctor. General instructions  Avoid things that can bring on headaches. Keep a headache journal to see what may bring on headaches. For example, write down: What you eat and  drink. How much sleep you get. Any change to your diet or medicines. Keep all follow-up visits. Contact a doctor if: Your headache does not get better. Your headache comes back. You have a headache, and sounds, light, or smells bother you. You feel like you may vomit, or you vomit. Your stomach hurts. Get help right away if: You all of a sudden get a very bad headache with any of these things: A stiff neck. Feeling like you may vomit. Vomiting. Feeling mixed up (confused). Feeling weak in one part or one side of your body. Having trouble seeing or speaking, or both. Feeling  short of breath. A rash. Feeling very sleepy. Pain in your eye or ear. Trouble walking or balancing. Feeling like you will faint, or you faint. Summary A tension headache is pain, pressure, or aching in your head. Tension headaches can last from 30 minutes to several days. Lifestyle changes and medicines may help relieve pain. This information is not intended to replace advice given to you by your health care provider. Make sure you discuss any questions you have with your health care provider. Document Revised: 09/08/2020 Document Reviewed: 09/08/2020 Elsevier Patient Education  2024 [employer].
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
## 2023-10-09 ENCOUNTER — Encounter: Payer: Self-pay | Admitting: Family Medicine

## 2023-10-09 LAB — URINALYSIS, ROUTINE W REFLEX MICROSCOPIC
Bacteria, UA: NONE SEEN /[HPF]
Bilirubin Urine: NEGATIVE
Glucose, UA: NEGATIVE
Hyaline Cast: NONE SEEN /[LPF]
Ketones, ur: NEGATIVE
Leukocytes,Ua: NEGATIVE
Nitrite: NEGATIVE
Specific Gravity, Urine: 1.016 (ref 1.001–1.035)
Squamous Epithelial / HPF: NONE SEEN /[HPF] (ref <=5)
WBC, UA: NONE SEEN /[HPF] (ref 0–5)
pH: 7.5 (ref 5.0–8.0)

## 2023-10-09 LAB — MICROALBUMIN / CREATININE URINE RATIO
Creatinine, Urine: 146 mg/dL (ref 20–275)
Microalb Creat Ratio: 224 mg/g{creat} — ABNORMAL HIGH (ref <30)
Microalb, Ur: 32.7 mg/dL

## 2023-10-09 LAB — MICROSCOPIC MESSAGE

## 2023-10-09 NOTE — Addendum Note (Signed)
Addended by: Danelle Berry on: 10/09/2023 10:10 AM   Modules accepted: Orders

## 2023-10-14 ENCOUNTER — Telehealth: Payer: Self-pay | Admitting: Family Medicine

## 2023-10-14 NOTE — Telephone Encounter (Unsigned)
Copied from CRM [id number]. Topic: General - Other >> Oct 14, 2023 12:42 PM Turkey B wrote: Reason for CRM: pt called in says already has allergy Dr for her asthma and says she doesn't need a referral, but her insurance is saying she does. She wants to go to Dr at [facility]. Phone # [phone]. Fx [phone]. I tried to explain that she needs to be seen with Dr Carlynn Purl before requesting a referral, but she insists that Dr Carlynn Purl already know this. She stated she has  appt for  physical that has nothing do with the Allergies

## 2023-10-15 NOTE — Telephone Encounter (Signed)
Pt has an appt with Erin Mecum for Wednesday 10/16/23

## 2023-10-16 ENCOUNTER — Telehealth (INDEPENDENT_AMBULATORY_CARE_PROVIDER_SITE_OTHER): Payer: Medicaid Other | Admitting: Physician Assistant

## 2023-10-16 DIAGNOSIS — Z9109 Other allergy status, other than to drugs and biological substances: Secondary | ICD-10-CM

## 2023-10-16 NOTE — Progress Notes (Signed)
     Virtual Visit via Video Note  I connected with Jennifer Byrd on 10/16/23 at  9:00 AM EDT by a video enabled telemedicine application and verified that I am speaking with the correct person using two identifiers.  Today's Provider: Jacquelin Hawking, MHS, PA-C Introduced myself to the patient as a PA-C and provided education on APPs in clinical practice.   Location: Patient: At home  Provider: [facility]    I discussed the limitations of evaluation and management by telemedicine and the availability of in person appointments. The patient expressed understanding and agreed to proceed.   Chief Complaint  Patient presents with   Referral    She wants to go to Dr at [facility]. Phone # [phone]. Fx [phone]    History of Present Illness:  She is requesting a referral for Asthma & Allergy  She has been seeing this provider for the past 6 years, every 6 months  She reports she sees them for management of her environmental and food allergies     Review of Systems  Constitutional:  Negative for chills, fever and weight loss.  Respiratory:  Negative for shortness of breath and wheezing.   Cardiovascular:  Negative for chest pain, palpitations and leg swelling.      Observations/Objective:  Due to the nature of the virtual visit, physical exam and observations are limited. Able to obtain the following observations:   Alert, oriented, x3 Appears comfortable, in no acute distress.  No scleral injection, no appreciated hoarseness, tachypnea, wheeze or strider. Able to maintain conversation without visible strain.  No cough appreciated during visit.    Assessment and Plan:  Problem List Items Addressed This Visit       Other   Environmental allergies - Primary    Appears chronic per patient, ongoing She reports seeing Allergy specialist for the last 6 years and would like to continue going there She reports needing a referral  for this as her insurance changed and they are requiring referral - referral placed per request to desired office  Follow up as needed for persistent or progressing symptoms        Relevant Orders   Ambulatory referral to Allergy    Follow Up Instructions:    I discussed the assessment and treatment plan with the patient. The patient was provided an opportunity to ask questions and all were answered. The patient agreed with the plan and demonstrated an understanding of the instructions.   The patient was advised to call back or seek an in-person evaluation if the symptoms worsen or if the condition fails to improve as anticipated.  I provided 5 minutes of non-face-to-face time during this encounter.  No follow-ups on file.   I, Torryn Fiske E Leathia Farnell, PA-C, have reviewed all documentation for this visit. The documentation on 10/16/23 for the exam, diagnosis, procedures, and orders are all accurate and complete.   Jacquelin Hawking, MHS, PA-C [facility]

## 2023-10-16 NOTE — Assessment & Plan Note (Signed)
Appears chronic per patient, ongoing She reports seeing Allergy specialist for the last 6 years and would like to continue going there She reports needing a referral for this as her insurance changed and they are requiring referral - referral placed per request to desired office  Follow up as needed for persistent or progressing symptoms

## 2023-10-23 DIAGNOSIS — L501 Idiopathic urticaria: Secondary | ICD-10-CM | POA: Diagnosis not present

## 2023-10-23 DIAGNOSIS — T7809XD Anaphylactic reaction due to other food products, subsequent encounter: Secondary | ICD-10-CM | POA: Diagnosis not present

## 2023-10-23 DIAGNOSIS — T781XXD Other adverse food reactions, not elsewhere classified, subsequent encounter: Secondary | ICD-10-CM | POA: Diagnosis not present

## 2023-10-23 DIAGNOSIS — J3089 Other allergic rhinitis: Secondary | ICD-10-CM | POA: Diagnosis not present
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
## 2023-10-29 ENCOUNTER — Ambulatory Visit (INDEPENDENT_AMBULATORY_CARE_PROVIDER_SITE_OTHER): Payer: Medicaid Other | Admitting: Family Medicine

## 2023-10-29 VITALS — BP 110/72 | HR 76 | Temp 97.4°F | Resp 12 | Ht 66.0 in | Wt 129.4 lb

## 2023-10-29 DIAGNOSIS — R809 Proteinuria, unspecified: Secondary | ICD-10-CM

## 2023-10-29 DIAGNOSIS — Z0000 Encounter for general adult medical examination without abnormal findings: Secondary | ICD-10-CM

## 2023-10-29 DIAGNOSIS — Z13220 Encounter for screening for lipoid disorders: Secondary | ICD-10-CM | POA: Diagnosis not present

## 2023-10-29 DIAGNOSIS — Z131 Encounter for screening for diabetes mellitus: Secondary | ICD-10-CM

## 2023-10-29 DIAGNOSIS — Z113 Encounter for screening for infections with a predominantly sexual mode of transmission: Secondary | ICD-10-CM

## 2023-10-31 ENCOUNTER — Encounter: Payer: Self-pay | Admitting: Family Medicine

## 2023-10-31 ENCOUNTER — Telehealth: Payer: Self-pay | Admitting: Family Medicine

## 2023-10-31 DIAGNOSIS — D241 Benign neoplasm of right breast: Secondary | ICD-10-CM | POA: Diagnosis not present

## 2023-10-31 DIAGNOSIS — Z30431 Encounter for routine checking of intrauterine contraceptive device: Secondary | ICD-10-CM | POA: Diagnosis not present

## 2023-10-31 DIAGNOSIS — Z1389 Encounter for screening for other disorder: Secondary | ICD-10-CM | POA: Diagnosis not present

## 2023-10-31 DIAGNOSIS — Z130 Encounter for screening for diseases of the blood and blood-forming organs and certain disorders involving the immune mechanism: Secondary | ICD-10-CM | POA: Diagnosis not present

## 2023-10-31 DIAGNOSIS — Z01419 Encounter for gynecological examination (general) (routine) without abnormal findings: Secondary | ICD-10-CM | POA: Diagnosis not present

## 2023-10-31 NOTE — Telephone Encounter (Signed)
Copied from CRM [id number]. Topic: General - Other >> Oct 31, 2023 11:06 AM Franchot Heidelberg wrote: Reason for CRM: Pt called requesting for her breast imaging to get sent to her [facility]  Provider: Jeani Sow  Fax: [phone] Phone number: [phone]

## 2023-11-01 ENCOUNTER — Other Ambulatory Visit: Payer: Self-pay | Admitting: Family Medicine

## 2023-11-01 DIAGNOSIS — R809 Proteinuria, unspecified: Secondary | ICD-10-CM

## 2023-11-01 LAB — CBC WITH DIFFERENTIAL/PLATELET
Absolute Lymphocytes: 1795 {cells}/uL (ref 850–3900)
Absolute Monocytes: 329 {cells}/uL (ref 200–950)
Basophils Absolute: 19 {cells}/uL (ref 0–200)
Basophils Relative: 0.5 %
Eosinophils Absolute: 148 {cells}/uL (ref 15–500)
Eosinophils Relative: 4 %
HCT: 41.2 % (ref 35.0–45.0)
Hemoglobin: 13 g/dL (ref 11.7–15.5)
MCH: 26.1 pg — ABNORMAL LOW (ref 27.0–33.0)
MCHC: 31.6 g/dL — ABNORMAL LOW (ref 32.0–36.0)
MCV: 82.6 fL (ref 80.0–100.0)
MPV: 12.1 fL (ref 7.5–12.5)
Monocytes Relative: 8.9 %
Neutro Abs: 1410 {cells}/uL — ABNORMAL LOW (ref 1500–7800)
Neutrophils Relative %: 38.1 %
Platelets: 217 10*3/uL (ref 140–400)
RBC: 4.99 10*6/uL (ref 3.80–5.10)
RDW: 14.3 % (ref 11.0–15.0)
Total Lymphocyte: 48.5 %
WBC: 3.7 10*3/uL — ABNORMAL LOW (ref 3.8–10.8)

## 2023-11-01 LAB — LIPID PANEL
Cholesterol: 192 mg/dL (ref <200)
HDL: 63 mg/dL (ref >=50)
LDL Cholesterol (Calc): 116 mg/dL — ABNORMAL HIGH
Non-HDL Cholesterol (Calc): 129 mg/dL (ref <130)
Total CHOL/HDL Ratio: 3 (calc) (ref <5.0)
Triglycerides: 44 mg/dL (ref <150)

## 2023-11-01 LAB — COMPLETE METABOLIC PANEL WITH GFR
AG Ratio: 1.4 (calc) (ref 1.0–2.5)
ALT: 8 U/L (ref 6–29)
AST: 14 U/L (ref 10–30)
Albumin: 4 g/dL (ref 3.6–5.1)
Alkaline phosphatase (APISO): 51 U/L (ref 31–125)
BUN: 11 mg/dL (ref 7–25)
CO2: 26 mmol/L (ref 20–32)
Calcium: 9 mg/dL (ref 8.6–10.2)
Chloride: 101 mmol/L (ref 98–110)
Creat: 0.88 mg/dL (ref 0.50–0.96)
Globulin: 2.8 g/dL (ref 1.9–3.7)
Glucose, Bld: 87 mg/dL (ref 65–99)
Potassium: 4 mmol/L (ref 3.5–5.3)
Sodium: 136 mmol/L (ref 135–146)
Total Bilirubin: 0.4 mg/dL (ref 0.2–1.2)
Total Protein: 6.8 g/dL (ref 6.1–8.1)
eGFR: 92 mL/min/{1.73_m2} (ref >=60)

## 2023-11-01 LAB — HEMOGLOBIN A1C
Hgb A1c MFr Bld: 4.4 %{Hb} (ref <5.7)
Mean Plasma Glucose: 80 mg/dL
eAG (mmol/L): 4.4 mmol/L

## 2023-11-01 LAB — HIV ANTIBODY (ROUTINE TESTING W REFLEX): HIV 1&2 Ab, 4th Generation: NONREACTIVE

## 2023-11-01 LAB — MICROALBUMIN / CREATININE URINE RATIO
Creatinine, Urine: 206 mg/dL (ref 20–275)
Microalb Creat Ratio: 359 mg/g{creat} — ABNORMAL HIGH (ref <30)
Microalb, Ur: 74 mg/dL

## 2023-11-01 LAB — VITAMIN D 25 HYDROXY (VIT D DEFICIENCY, FRACTURES): Vit D, 25-Hydroxy: 23 ng/mL — ABNORMAL LOW (ref 30–100)

## 2023-11-01 LAB — RPR: RPR Ser Ql: NONREACTIVE

## 2023-11-14 ENCOUNTER — Other Ambulatory Visit: Payer: Self-pay | Admitting: Family Medicine

## 2023-11-14 DIAGNOSIS — D708 Other neutropenia: Secondary | ICD-10-CM

## 2023-11-20 LAB — CBC WITH DIFFERENTIAL/PLATELET
Absolute Lymphocytes: 1825 {cells}/uL (ref 850–3900)
Absolute Monocytes: 363 {cells}/uL (ref 200–950)
Basophils Absolute: 31 {cells}/uL (ref 0–200)
Basophils Relative: 0.8 %
Eosinophils Absolute: 109 {cells}/uL (ref 15–500)
Eosinophils Relative: 2.8 %
HCT: 40.8 % (ref 35.0–45.0)
Hemoglobin: 12.6 g/dL (ref 11.7–15.5)
MCH: 25.4 pg — ABNORMAL LOW (ref 27.0–33.0)
MCHC: 30.9 g/dL — ABNORMAL LOW (ref 32.0–36.0)
MCV: 82.3 fL (ref 80.0–100.0)
MPV: 11.7 fL (ref 7.5–12.5)
Monocytes Relative: 9.3 %
Neutro Abs: 1572 {cells}/uL (ref 1500–7800)
Neutrophils Relative %: 40.3 %
Platelets: 231 10*3/uL (ref 140–400)
RBC: 4.96 10*6/uL (ref 3.80–5.10)
RDW: 14.6 % (ref 11.0–15.0)
Total Lymphocyte: 46.8 %
WBC: 3.9 10*3/uL (ref 3.8–10.8)

## 2023-11-22 ENCOUNTER — Emergency Department
Admission: EM | Admit: 2023-11-22 | Discharge: 2023-11-22 | Disposition: A | Discharge status: Home / Self Care | Payer: Medicaid Other | Attending: Emergency Medicine | Admitting: Emergency Medicine

## 2023-11-22 ENCOUNTER — Other Ambulatory Visit: Payer: Self-pay

## 2023-11-22 DIAGNOSIS — R002 Palpitations: Secondary | ICD-10-CM | POA: Diagnosis present

## 2023-11-22 DIAGNOSIS — R55 Syncope and collapse: Secondary | ICD-10-CM | POA: Insufficient documentation

## 2023-11-22 LAB — CBC
HCT: 39.9 % (ref 36.0–46.0)
Hemoglobin: 13.3 g/dL (ref 12.0–15.0)
MCH: 26.2 pg (ref 26.0–34.0)
MCHC: 33.3 g/dL (ref 30.0–36.0)
MCV: 78.5 fL — ABNORMAL LOW (ref 80.0–100.0)
Platelets: 249 10*3/uL (ref 150–400)
RBC: 5.08 MIL/uL (ref 3.87–5.11)
RDW: 14.6 % (ref 11.5–15.5)
WBC: 4.9 10*3/uL (ref 4.0–10.5)
nRBC: 0 % (ref 0.0–0.2)

## 2023-11-22 LAB — PREGNANCY, URINE: Preg Test, Ur: NEGATIVE

## 2023-11-22 LAB — URINALYSIS, ROUTINE W REFLEX MICROSCOPIC
Bilirubin Urine: NEGATIVE
Glucose, UA: NEGATIVE mg/dL
Hgb urine dipstick: NEGATIVE
Ketones, ur: NEGATIVE mg/dL
Leukocytes,Ua: NEGATIVE
Nitrite: NEGATIVE
Protein, ur: NEGATIVE mg/dL
Specific Gravity, Urine: 1.005 (ref 1.005–1.030)
pH: 7 (ref 5.0–8.0)

## 2023-11-22 LAB — BASIC METABOLIC PANEL
Anion gap: 8 (ref 5–15)
BUN: 18 mg/dL (ref 6–20)
CO2: 25 mmol/L (ref 22–32)
Calcium: 9.1 mg/dL (ref 8.9–10.3)
Chloride: 103 mmol/L (ref 98–111)
Creatinine, Ser: 0.99 mg/dL (ref 0.44–1.00)
GFR, Estimated: >60 mL/min (ref >60)
Glucose, Bld: 81 mg/dL (ref 70–99)
Potassium: 4.3 mmol/L (ref 3.5–5.1)
Sodium: 136 mmol/L (ref 135–145)

## 2023-11-22 LAB — TSH: TSH: 0.897 u[IU]/mL (ref 0.350–4.500)

## 2023-11-22 LAB — T4, FREE: Free T4: 0.76 ng/dL (ref 0.61–1.12)

## 2023-11-22 LAB — MAGNESIUM: Magnesium: 2.1 mg/dL (ref 1.7–2.4)

## 2023-11-22 NOTE — ED Triage Notes (Signed)
Pt states that she started having palpitations and states that her face got very flushed and states that she felt a little dizzy, and slightly feeling like she would pass out. Pt states in 2016 she had to have iron infusions, reports that she has been seen by cardiology in the past and was cleared for her palpitations

## 2023-11-22 NOTE — ED Provider Notes (Signed)
[facility] Provider Note    Event Date/Time   First MD Initiated Contact with Patient 11/22/23 1303     (approximate)   History   Palpitations   HPI  Jennifer Byrd is a 27 y.o. female   Past medical history of anxiety and panic attacks who presents to the emergency department with heart palpitations and a near syncopal episode while in the bathroom, standing earlier today.  Developed fluttering in the chest and then felt flushed warm feeling and had near syncopal episode did not fully pass out or sustain any trauma.  Sat down and the feeling subsided after a few minutes.  Otherwise been in regular state of health no other recent illnesses or medical complaints.  No changes to medications, no drug or alcohol use.  Denies chest pain or shortness of breath.  External Medical Documents Reviewed: Family medicine visit from 10/29/2023 documented past medical history and routine health screening      Physical Exam   Triage Vital Signs: ED Triage Vitals  Encounter Vitals Group     BP 11/22/23 1249 122/84     Systolic BP Percentile --      Diastolic BP Percentile --      Pulse Rate 11/22/23 1249 87     Resp 11/22/23 1249 16     Temp 11/22/23 1249 (!) 97.1 F (36.2 C)     Temp Source 11/22/23 1249 Oral     SpO2 11/22/23 1249 100 %     Weight 11/22/23 1250 130 lb (59 kg)     Height 11/22/23 1250 5\' 6"  (1.676 m)     Head Circumference --      Peak Flow --      Pain Score 11/22/23 1250 0     Pain Loc --      Pain Education --      Exclude from Growth Chart --     Most recent vital signs: Vitals:   11/22/23 1249  BP: 122/84  Pulse: 87  Resp: 16  Temp: (!) 97.1 F (36.2 C)  SpO2: 100%    General: Awake, no distress.  CV:  Good peripheral perfusion.  Resp:  Normal effort.  Abd:  No distention.  Other:  Well-appearing no acute distress.  Normal vital signs.  Heart sounds normal rate and rhythm no murmurs.  Lungs clear.  No dysarthria  or facial asymmetry, no motor or sensory deficits.   ED Results / Procedures / Treatments   Labs (all labs ordered are listed, but only abnormal results are displayed) Labs Reviewed  CBC - Abnormal; Notable for the following components:      Result Value   MCV 78.5 (*)    All other components within normal limits  URINALYSIS, ROUTINE W REFLEX MICROSCOPIC - Abnormal; Notable for the following components:   Color, Urine STRAW (*)    APPearance CLEAR (*)    All other components within normal limits  BASIC METABOLIC PANEL  MAGNESIUM  TSH  T4, FREE  PREGNANCY, URINE     I ordered and reviewed the above labs they are notable for cell counts electrolytes and thyroid studies normal.  EKG  ED ECG REPORT I, Pilar Jarvis, the attending physician, personally viewed and interpreted this ECG.   Date: 11/22/2023  EKG Time: 1253  Rate: 70  Rhythm: sinus  Axis: nl  Intervals:none  ST&T Change: no stemi    PROCEDURES:  Critical Care performed: No  Procedures   MEDICATIONS ORDERED IN  ED: Medications - No data to display  IMPRESSION / MDM / ASSESSMENT AND PLAN / ED COURSE  I reviewed the triage vital signs and the nursing notes.                                Patient's presentation is most consistent with acute presentation with potential threat to life or bodily function.  Differential diagnosis includes, but is not limited to, dysrhythmia, electrolyte disturbance, thyroid dysfunction, anxiety or panic attack, vasovagal syncope   The patient is on the cardiac monitor to evaluate for evidence of arrhythmia and/or significant heart rate changes.  MDM:    Most resembles a vasovagal, check EKG for dysrhythmias, appears normal, check electrolytes and thyroid studies which look normal.  Asymptomatic now.  Doubt cardiopulmonary or neurologic emergency.  Discharge.        FINAL CLINICAL IMPRESSION(S) / ED DIAGNOSES   Final diagnoses:  Palpitations  Near syncope      Rx / DC Orders   ED Discharge Orders     None        Note:  This document was prepared using Dragon voice recognition software and may include unintentional dictation errors.    Pilar Jarvis, MD 11/22/23 [phone]

## 2023-11-22 NOTE — Discharge Instructions (Signed)
Fortunately your evaluation the emergency department did not show any emergency conditions that account for your symptoms earlier today.  Thank you for choosing [place] for your health care today!  Please see your primary doctor this week for a follow up appointment.   If you have any new, worsening, or unexpected symptoms call your doctor right away or come back to the emergency department for reevaluation.  It was my pleasure to care for you today.   Daneil Dan Modesto Charon, MD

## 2023-11-28 ENCOUNTER — Ambulatory Visit: Payer: Self-pay | Admitting: *Deleted

## 2023-11-28 ENCOUNTER — Telehealth: Payer: Medicaid Other | Admitting: Internal Medicine

## 2023-11-28 NOTE — Telephone Encounter (Signed)
   Chief Complaint: Neck pain Symptoms: Neck pain, radiating up back of head, 5/10, stiffness. Occurred after a work out at gym. Frequency: Last night Pertinent Negatives: Patient denies  Disposition: [] ED /[] Urgent Care (no appt availability in office) / [] Appointment(In office/virtual)/ []  [place] [facility]/ [] Home Care/ [] Refused Recommended Disposition /[] [place] [facility]/ []  Follow-up with PCP Additional Notes:  During triage call pt stated she had just received a message from her insurance she could do a virtual "For free."  Stated she would call back to secure appt if needed. Stated "Just need a note, I'll be out of work."  Reason for Disposition  Neck pain or stiffness  Answer Assessment - Initial Assessment Questions 1. ONSET: "When did the pain begin?"      Last night 2. LOCATION: "Where does it hurt?"      Back of neck, pain with movement 3. PATTERN "Does the pain come and go, or has it been constant since it started?"      constant 4. SEVERITY: "How bad is the pain?"  (Scale 1-10; or mild, moderate, severe)   - NO PAIN (0): no pain or only slight stiffness    - MILD (1-3): doesn't interfere with normal activities    - MODERATE (4-7): interferes with normal activities or awakens from sleep    - SEVERE (8-10):  excruciating pain, unable to do any normal activities      5/10 5. RADIATION: "Does the pain go anywhere else, shoot into your arms?"     Back of head 6. CORD SYMPTOMS: "Any weakness or numbness of the arms or legs?"     No 7. CAUSE: "What do you think is causing the neck pain?"     A work out 8. NECK OVERUSE: "Any recent activities that involved turning or twisting the neck?"     Work out 9. OTHER SYMPTOMS: "Do you have any other symptoms?" (e.g., headache, fever, chest pain, difficulty breathing, neck swelling)     Stiffness  Protocols used: Neck Pain or Stiffness-A-AH

## 2023-12-26 ENCOUNTER — Ambulatory Visit: Payer: Self-pay

## 2023-12-26 NOTE — Telephone Encounter (Signed)
 Summary: Medication question for nurse   Pt called reporting that she has a medical question for a nurse, requesting a call back from the clinic. Pt is experiencing ringing in her ears.         Chief Complaint: Ringing in both ears since New Years, was around loud music. Symptoms: Above Frequency: New Years Pertinent Negatives: Patient denies hearing loss Disposition: [] ED /[] Urgent Care (no appt availability in office) / [x] Appointment(In office/virtual)/ []  [facility]/ [] Home Care/ [] Refused Recommended Disposition /[] [facility]/ []  Follow-up with PCP Additional Notes: agrees with appointment.  Reason for Disposition  MODERATE-SEVERE tinnitus (i.e., interferes with work, school, or sleep)  Answer Assessment - Initial Assessment Questions 1. DESCRIPTION: Describe the sound you are hearing. (e.g., buzzing, hissing, humming, ringing)     Ringing in ears 2. LOCATION: Is the sound in one or both ears? If one, ask: Which ear?     Both 3. SEVERITY: How bad is it?    - MILD: Doesn't interfere with normal activities, only can hear in a quiet room.    - MODERATE-SEVERE: interferes with work, school, sleep, or other activities.      Mild 4. ONSET: When did this begin? Did it start suddenly or come on gradually?     New Years 5. PATTERN: Does this come and go, or has it been constant since it started?     Constant 6. HEARING LOSS: Is your hearing decreased? (e.g., normal, decreased)       No 7. OTHER SYMPTOMS: Do you have any other symptoms? (e.g., dizziness, earache)     No 8. PREGNANCY: Is there any chance you are pregnant? When was your last menstrual period?     No  Protocols used: Tinnitus-A-AH

## 2023-12-27 ENCOUNTER — Ambulatory Visit: Payer: Self-pay | Admitting: *Deleted

## 2023-12-27 ENCOUNTER — Ambulatory Visit: Payer: Medicaid Other | Admitting: Physician Assistant

## 2023-12-27 ENCOUNTER — Encounter: Payer: Self-pay | Admitting: Physician Assistant

## 2023-12-27 VITALS — BP 118/76 | HR 96 | Resp 16 | Ht 66.0 in | Wt 134.0 lb

## 2023-12-27 DIAGNOSIS — H9313 Tinnitus, bilateral: Secondary | ICD-10-CM | POA: Diagnosis not present

## 2023-12-27 NOTE — Telephone Encounter (Signed)
  Chief Complaint: She agreeable to keep ing her appt today Symptoms: ringing in ears Frequency: since New Year's Pertinent Negatives: Patient denies pain Disposition: [] ED /[] Urgent Care (no appt availability in office) / [x] Appointment(In office/virtual)/ []  [facility]/ [] Home Care/ [] Refused Recommended Disposition /[] [facility]/ []  Follow-up with PCP Additional Notes: Coming for appt today at 2:00
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
## 2024-01-30 ENCOUNTER — Ambulatory Visit: Payer: Medicaid Other | Admitting: Nurse Practitioner

## 2024-01-30 ENCOUNTER — Encounter: Payer: Self-pay | Admitting: Nurse Practitioner

## 2024-01-30 VITALS — BP 116/76 | HR 102 | Resp 18 | Ht 66.0 in | Wt 132.6 lb

## 2024-01-30 DIAGNOSIS — M542 Cervicalgia: Secondary | ICD-10-CM

## 2024-01-30 DIAGNOSIS — H9313 Tinnitus, bilateral: Secondary | ICD-10-CM

## 2024-01-30 MED ORDER — TIZANIDINE HCL 4 MG PO TABS: 2.0000 mg | ORAL_TABLET | Freq: Three times a day (TID) | ORAL | 0 refills | Status: DC | PRN

## 2024-01-30 MED ORDER — NAPROXEN 500 MG PO TABS: 500.0000 mg | ORAL_TABLET | Freq: Two times a day (BID) | ORAL | 0 refills | Status: AC
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
## 2024-03-09 ENCOUNTER — Other Ambulatory Visit (HOSPITAL_COMMUNITY)
Admission: RE | Admit: 2024-03-09 | Discharge: 2024-03-09 | Disposition: A | Discharge status: Home / Self Care | Source: Ambulatory Visit | Attending: Family Medicine | Admitting: Family Medicine

## 2024-03-09 ENCOUNTER — Encounter: Payer: Self-pay | Admitting: Family Medicine

## 2024-03-09 ENCOUNTER — Ambulatory Visit: Admitting: Family Medicine

## 2024-03-09 VITALS — BP 118/68 | HR 62 | Resp 16 | Ht 66.0 in | Wt 128.0 lb

## 2024-03-09 DIAGNOSIS — N898 Other specified noninflammatory disorders of vagina: Secondary | ICD-10-CM | POA: Insufficient documentation

## 2024-03-09 DIAGNOSIS — E559 Vitamin D deficiency, unspecified: Secondary | ICD-10-CM

## 2024-03-09 MED ORDER — METRONIDAZOLE 0.75 % VA GEL: VAGINAL | 3 refills | Status: DC
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
## 2024-03-10 ENCOUNTER — Encounter: Payer: Self-pay | Admitting: Family Medicine

## 2024-03-10 LAB — CERVICOVAGINAL ANCILLARY ONLY
Bacterial Vaginitis (gardnerella): NEGATIVE
Candida Glabrata: NEGATIVE
Candida Vaginitis: NEGATIVE
Chlamydia: NEGATIVE
Comment: NEGATIVE
Comment: NEGATIVE
Comment: NEGATIVE
Comment: NEGATIVE
Comment: NEGATIVE
Comment: NORMAL
Neisseria Gonorrhea: NEGATIVE
Trichomonas: NEGATIVE

## 2024-04-20 ENCOUNTER — Ambulatory Visit: Payer: Self-pay

## 2024-04-20 ENCOUNTER — Ambulatory Visit: Admitting: Family Medicine

## 2024-04-20 ENCOUNTER — Other Ambulatory Visit: Payer: Self-pay | Admitting: Family Medicine

## 2024-04-20 ENCOUNTER — Encounter: Payer: Self-pay | Admitting: Family Medicine

## 2024-04-20 VITALS — BP 124/76 | HR 74 | Resp 16 | Ht 66.0 in | Wt 128.6 lb

## 2024-04-20 DIAGNOSIS — R809 Proteinuria, unspecified: Secondary | ICD-10-CM

## 2024-04-20 DIAGNOSIS — R768 Other specified abnormal immunological findings in serum: Secondary | ICD-10-CM | POA: Diagnosis not present

## 2024-04-20 DIAGNOSIS — M791 Myalgia, unspecified site: Secondary | ICD-10-CM | POA: Diagnosis not present

## 2024-04-20 NOTE — Telephone Encounter (Signed)
  Chief Complaint: elbow swelling Symptoms: bilateral elbow swelling and mild to moderate pain Frequency: x 4 days Pertinent Negatives: Patient denies redness, fever, chest pain, difficult breathing, dark colored urine Disposition: [] ED /[] Urgent Care (no appt availability in office) / [x] Appointment(In office/virtual)/ []  [facility]/ [] Home Care/ [] Refused Recommended Disposition /[] [place] [facility]/ []  Follow-up with PCP Additional Notes: Patient states she did a heavy work out, lifting weights on Friday and feels like she pushed herself too hard. Patient states she is concerned and would like to be seen today, patient scheduled with PCP. Advised OTC pain medication and ice the area before visit.  Copied from CRM [id number]. Topic: Clinical - Red Word Triage >> Apr 20, 2024  8:58 AM Donald Frost wrote: Red Word that prompted transfer to Nurse Triage: The patient called in stating that ever since she exercised on Friday her arms have been swelling and it will not go away. She called to schedule an appt with her provider but would like to speak with a nurse first. I will transfer her to E2C2 NT Reason for Disposition  [1] Painful joint AND [2] no fever  Answer Assessment - Initial Assessment Questions 1. ONSET: "When did the swelling start?" (e.g., minutes, hours, days)     Friday after exercising.  2. LOCATION: "What part of the arm is swollen?"  "Are both arms swollen or just one arm?"     Both arms, elbows.  3. SEVERITY: "How bad is the swelling?" (e.g., localized; mild, moderate, severe)   - LOCALIZED: Small area of puffiness or swelling on just one arm   - JOINT SWELLING: Swelling of one joint   - MILD: Puffiness or swelling of hand   - MODERATE: Puffiness or swollen feeling of entire arm    - SEVERE: All of arm looks swollen; pitting edema     Joint swelling.  4. REDNESS: "Does the swelling look red or infected?"     Denies.  5. PAIN: "Is the swelling painful to  touch?" If Yes, ask: "How painful is it?"   (Scale 1-10; mild, moderate or severe)     Soreness, mild to moderate.  6. FEVER: "Do you have a fever?" If Yes, ask: "What is it, how was it measured, and when did it start?"      Denies.  7. CAUSE: "What do you think is causing the arm swelling?"     Over use/exercise.  8. MEDICAL HISTORY: "Do you have a history of heart failure, kidney disease, liver failure, or cancer?"     Denies.  9. RECURRENT SYMPTOM: "Have you had arm swelling before?" If Yes, ask: "When was the last time?" "What happened that time?"     Denies.  10. OTHER SYMPTOMS: "Do you have any other symptoms?" (e.g., chest pain, difficulty breathing)       Denies.  11. PREGNANCY: "Is there any chance you are pregnant?" "When was your last menstrual period?"       Has IUD but is spotting right now.  Protocols used: Arm Swelling and Edema-A-AH, Elbow Swelling-A-AH
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
## 2024-04-21 ENCOUNTER — Encounter: Payer: Self-pay | Admitting: Family Medicine

## 2024-04-21 LAB — CK: Total CK: 89 U/L (ref 20–239)

## 2024-05-03 ENCOUNTER — Emergency Department
Admission: EM | Admit: 2024-05-03 | Discharge: 2024-05-03 | Disposition: A | Discharge status: Home / Self Care | Attending: Emergency Medicine | Admitting: Emergency Medicine

## 2024-05-03 ENCOUNTER — Other Ambulatory Visit: Payer: Self-pay

## 2024-05-03 DIAGNOSIS — S40861A Insect bite (nonvenomous) of right upper arm, initial encounter: Secondary | ICD-10-CM | POA: Diagnosis present

## 2024-05-03 DIAGNOSIS — W57XXXA Bitten or stung by nonvenomous insect and other nonvenomous arthropods, initial encounter: Secondary | ICD-10-CM | POA: Insufficient documentation

## 2024-05-03 MED ORDER — PREDNISONE 10 MG (21) PO TBPK: ORAL_TABLET | ORAL | 0 refills | Status: DC

## 2024-05-03 NOTE — Discharge Instructions (Signed)
 Follow-up with your regular doctor if not improving in 2 to 3 days.  Use over-the-counter hydrocortisone  cream.  Take medication as prescribed.  He may also take Benadryl  and Claritin.  Return emergency department if any throat swelling or difficulty breathing.

## 2024-05-03 NOTE — ED Notes (Addendum)
 See  triage  note  States she noticed a red ,swollen area over right eyey last pm  Woke up with areas to hand and right arm  Slight itching

## 2024-05-03 NOTE — ED Triage Notes (Signed)
 Pt to ED for rash which started last night. Pt has red bumps to R arm, 1 on face, and starting on L arm. Starting to itch.

## 2024-05-03 NOTE — ED Provider Notes (Signed)
 [facility] Provider Note    Event Date/Time   First MD Initiated Contact with Patient 05/03/24 1034     (approximate)   History   Rash   HPI  Jennifer Byrd is a 28 y.o. female no significant past medical history other than a allergy presents to the emergency department stating that she has red splotches on her right upper arm.  Noticed a couple on her left hand.  States she was at a bar last night and is unsure if there had been knots on the bar counter.  Was not outside.  Also did see a spider in her car.  Denies throat swelling, difficulty breathing.      Physical Exam   Triage Vital Signs: ED Triage Vitals  Encounter Vitals Group     BP 05/03/24 1014 114/86     Systolic BP Percentile --      Diastolic BP Percentile --      Pulse Rate 05/03/24 1014 80     Resp 05/03/24 1014 20     Temp 05/03/24 1014 97.8 F (36.6 C)     Temp Source 05/03/24 1014 Oral     SpO2 05/03/24 1014 100 %     Weight 05/03/24 1013 125 lb (56.7 kg)     Height 05/03/24 1013 5\' 6"  (1.676 m)     Head Circumference --      Peak Flow --      Pain Score 05/03/24 1012 0     Pain Loc --      Pain Education --      Exclude from Growth Chart --     Most recent vital signs: Vitals:   05/03/24 1014  BP: 114/86  Pulse: 80  Resp: 20  Temp: 97.8 F (36.6 C)  SpO2: 100%     General: Awake, no distress.   CV:  Good peripheral perfusion. regular rate and  rhythm Resp:  Normal effort. Lungs CTA Abd:  No distention.   Other:  Skin with scattered red blotches on the right arm, no pustule or vesicles.  1 red raised area on the left hand, neurovascular intact   ED Results / Procedures / Treatments   Labs (all labs ordered are listed, but only abnormal results are displayed) Labs Reviewed - No data to display   EKG     RADIOLOGY     PROCEDURES:   Procedures  Critical Care: No Chief Complaint  Patient presents with   Rash      MEDICATIONS  ORDERED IN ED: Medications - No data to display   IMPRESSION / MDM / ASSESSMENT AND PLAN / ED COURSE  I reviewed the triage vital signs and the nursing notes.                              Differential diagnosis includes, but is not limited to, insect bite, allergic reaction, contact dermatitis  Patient's presentation is most consistent with acute, uncomplicated illness.   Patient's rash goes in a straight line up her arm typical of a bug bite.  States the area is itchy.  No facial swelling or throat swelling so do not feel this is an allergic reaction.  Will start her on Sterapred, hydrocortisone  cream.  Follow-up with her regular doctor.  Take Benadryl  and Claritin.  She is in agreement treatment plan.  Strict instructions to return if throat swelling difficulty breathing.  Discharged in stable condition.  FINAL CLINICAL IMPRESSION(S) / ED DIAGNOSES   Final diagnoses:  Insect bite of right upper arm, initial encounter     Rx / DC Orders   ED Discharge Orders          Ordered    predniSONE (STERAPRED UNI-PAK 21 TAB) 10 MG (21) TBPK tablet        05/03/24 1047             Note:  This document was prepared using Dragon voice recognition software and may include unintentional dictation errors.    Delsie Figures, PA-C 05/03/24 1050    Ruth Cove, MD 05/03/24 [phone]

## 2024-05-04 ENCOUNTER — Ambulatory Visit (INDEPENDENT_AMBULATORY_CARE_PROVIDER_SITE_OTHER): Admitting: Family Medicine

## 2024-05-04 ENCOUNTER — Encounter: Payer: Self-pay | Admitting: Family Medicine

## 2024-05-04 VITALS — BP 122/76 | HR 84 | Resp 16 | Ht 66.0 in | Wt 121.9 lb

## 2024-05-04 DIAGNOSIS — R21 Rash and other nonspecific skin eruption: Secondary | ICD-10-CM | POA: Diagnosis not present

## 2024-05-04 MED ORDER — ZORYVE 0.15 % EX CREA: 1.0000 g | TOPICAL_CREAM | Freq: Every day | CUTANEOUS | Status: DC
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
## 2024-06-12 ENCOUNTER — Ambulatory Visit: Admitting: Family Medicine

## 2024-06-29 ENCOUNTER — Encounter: Payer: Self-pay | Admitting: *Deleted

## 2024-06-29 ENCOUNTER — Other Ambulatory Visit: Payer: Self-pay

## 2024-06-29 ENCOUNTER — Emergency Department

## 2024-06-29 DIAGNOSIS — R0602 Shortness of breath: Secondary | ICD-10-CM | POA: Diagnosis present

## 2024-06-29 DIAGNOSIS — R0789 Other chest pain: Secondary | ICD-10-CM | POA: Diagnosis not present

## 2024-06-29 DIAGNOSIS — R7989 Other specified abnormal findings of blood chemistry: Secondary | ICD-10-CM | POA: Diagnosis not present

## 2024-06-29 LAB — BASIC METABOLIC PANEL WITH GFR
Anion gap: 6 (ref 5–15)
BUN: 13 mg/dL (ref 6–20)
CO2: 27 mmol/L (ref 22–32)
Calcium: 9.3 mg/dL (ref 8.9–10.3)
Chloride: 104 mmol/L (ref 98–111)
Creatinine, Ser: 0.85 mg/dL (ref 0.44–1.00)
GFR, Estimated: >60 mL/min (ref >60)
Glucose, Bld: 94 mg/dL (ref 70–99)
Potassium: 4.3 mmol/L (ref 3.5–5.1)
Sodium: 137 mmol/L (ref 135–145)

## 2024-06-29 LAB — CBC
HCT: 38.3 % (ref 36.0–46.0)
Hemoglobin: 13.1 g/dL (ref 12.0–15.0)
MCH: 26.4 pg (ref 26.0–34.0)
MCHC: 34.2 g/dL (ref 30.0–36.0)
MCV: 77.2 fL — ABNORMAL LOW (ref 80.0–100.0)
Platelets: 202 K/uL (ref 150–400)
RBC: 4.96 MIL/uL (ref 3.87–5.11)
RDW: 15.5 % (ref 11.5–15.5)
WBC: 5.1 K/uL (ref 4.0–10.5)
nRBC: 0 % (ref 0.0–0.2)

## 2024-06-29 LAB — TROPONIN I (HIGH SENSITIVITY): Troponin I (High Sensitivity): <2 ng/L (ref <18)

## 2024-06-29 NOTE — ED Triage Notes (Signed)
 Pt ambulatory to triage.  Pt reports sob for 3 hours  pt also has chest tightness   sx began before working out today.  No n/v  no cough  pt alert.

## 2024-06-30 ENCOUNTER — Emergency Department

## 2024-06-30 ENCOUNTER — Encounter (INDEPENDENT_AMBULATORY_CARE_PROVIDER_SITE_OTHER): Admitting: Family Medicine

## 2024-06-30 ENCOUNTER — Emergency Department
Admission: EM | Admit: 2024-06-30 | Discharge: 2024-06-30 | Disposition: A | Discharge status: Home / Self Care | Attending: Emergency Medicine | Admitting: Emergency Medicine

## 2024-06-30 DIAGNOSIS — R0602 Shortness of breath: Secondary | ICD-10-CM

## 2024-06-30 DIAGNOSIS — R7989 Other specified abnormal findings of blood chemistry: Secondary | ICD-10-CM

## 2024-06-30 LAB — D-DIMER, QUANTITATIVE: D-Dimer, Quant: 1.25 ug{FEU}/mL — ABNORMAL HIGH (ref 0.00–0.50)

## 2024-06-30 NOTE — ED Provider Notes (Signed)
 [facility] Provider Note    Event Date/Time   First MD Initiated Contact with Patient 06/30/24 0109     (approximate)   History   Chief Complaint: Shortness of Breath   HPI  Jennifer Byrd is a 28 y.o. female with a past history of anxiety who comes ED complaining of central and right sided chest tightness, discomfort with deep breathing.  No cough or fever.  Started after doing exercise today.  She is very athletic and does strenuous workouts routinely.  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  No recent hospitalization travel trauma or surgery.  No history of DVT or PE        Past Medical History:  Diagnosis Date   Acid reflux    Allergy    Anemia    Anxiety    Heart palpitations    Strep throat     Current Outpatient Rx   Order #: [id number] Class: Historical Med   Order #: [id number] Class: Historical Med   Order #: [id number] Class: Historical Med   Order #: [id number] Class: Normal   Order #: [id number] Class: Normal   Order #: [id number] Class: No Print   Order #: [id number] Class: Normal    Past Surgical History:  Procedure Laterality Date   wisdon teeth      Physical Exam   Triage Vital Signs: ED Triage Vitals  Encounter Vitals Group     BP 06/29/24 2043 117/87     Girls Systolic BP Percentile --      Girls Diastolic BP Percentile --      Boys Systolic BP Percentile --      Boys Diastolic BP Percentile --      Pulse Rate 06/29/24 2043 70     Resp 06/29/24 2043 18     Temp 06/29/24 2043 98.2 F (36.8 C)     Temp Source 06/29/24 2043 Oral     SpO2 06/29/24 2043 100 %     Weight 06/29/24 2041 130 lb (59 kg)     Height 06/29/24 2041 5' 6 (1.676 m)     Head Circumference --      Peak Flow --      Pain Score 06/29/24 2041 0     Pain Loc --      Pain Education --      Exclude from Growth Chart --     Most recent vital signs: Vitals:   06/29/24 2043 06/30/24 0117  BP: 117/87 112/79   Pulse: 70 69  Resp: 18 18  Temp: 98.2 F (36.8 C) 98.2 F (36.8 C)  SpO2: 100% 100%    General: Awake, no distress.  CV:  Good peripheral perfusion.  Regular rate rhythm Resp:  Normal effort.  Clear to auscultation bilaterally Abd:  No distention.  Soft nontender Other:  No calf tenderness.  Slightly increased right calf circumference   ED Results / Procedures / Treatments   Labs (all labs ordered are listed, but only abnormal results are displayed) Labs Reviewed  CBC - Abnormal; Notable for the following components:      Result Value   MCV 77.2 (*)    All other components within normal limits  D-DIMER, QUANTITATIVE - Abnormal; Notable for the following components:   D-Dimer, Quant 1.25 (*)    All other components within normal limits  BASIC METABOLIC PANEL WITH GFR  POC URINE PREG, ED  TROPONIN I (HIGH SENSITIVITY)     EKG Interpreted by  me Normal sinus rhythm rate of 62.  Normal axis intervals QRS ST segments T waves   RADIOLOGY Chest x-ray interpreted by me, appears normal.  Radiology report reviewed   PROCEDURES:  Procedures   MEDICATIONS ORDERED IN ED: Medications - No data to display   IMPRESSION / MDM / ASSESSMENT AND PLAN / ED COURSE  I reviewed the triage vital signs and the nursing notes.  DDx: Pneumothorax, muscle strain, pulmonary embolism, dehydration, AKI  Patient's presentation is most consistent with acute presentation with potential threat to life or bodily function.  Patient presents with chest tightness, shortness of breath, some pleuritic chest discomfort.  Vital signs normal.  Wells criteria low risk.  Will obtain D-dimer to rule stratify.   Clinical Course as of 06/30/24 0511  Tue Jun 30, 2024  0258 D-Dimer, Quant(!): 1.25 + dd. Had long conversation with pt about risk/benefits of CTPA, mainly undiagnosed PE vs slight increase in lifetime cancer risk. She requests time to consider. [PS]  0401 Patient declines to undergo CT to  evaluate for PE.  She states that she was recently discharged, and will monitor her symptoms.  Discussed strict return precautions including follow-up evaluation in 24 hours if symptoms are not resolved.  Immediate return to the emergency department if symptoms worsen.  She is hemodynamically stable. [PS]    Clinical Course User Index [PS] Viviann Pastor, MD    ----------------------------------------- 5:11 AM on 06/30/2024 ----------------------------------------- Patient discharge AGAINST MEDICAL ADVICE.  She has medical decision-making capacity.   FINAL CLINICAL IMPRESSION(S) / ED DIAGNOSES   Final diagnoses:  Shortness of breath  Elevated d-dimer     Rx / DC Orders   ED Discharge Orders     None        Note:  This document was prepared using Dragon voice recognition software and may include unintentional dictation errors.   Viviann Pastor, MD 06/30/24 [phone]

## 2024-06-30 NOTE — Discharge Instructions (Signed)
 Follow-up with your doctor or return to the emergency department if symptoms do not resolve.  Return to the ER immediately if you have any worsening of your symptoms.

## 2024-07-01 ENCOUNTER — Ambulatory Visit (INDEPENDENT_AMBULATORY_CARE_PROVIDER_SITE_OTHER): Admitting: Family Medicine

## 2024-07-01 ENCOUNTER — Encounter: Payer: Self-pay | Admitting: Family Medicine

## 2024-07-01 VITALS — BP 120/76 | HR 79 | Resp 16 | Ht 66.0 in | Wt 133.3 lb

## 2024-07-01 DIAGNOSIS — R0981 Nasal congestion: Secondary | ICD-10-CM

## 2024-07-01 DIAGNOSIS — R7989 Other specified abnormal findings of blood chemistry: Secondary | ICD-10-CM
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
## 2024-07-02 ENCOUNTER — Other Ambulatory Visit
Admission: RE | Admit: 2024-07-02 | Discharge: 2024-07-02 | Disposition: A | Discharge status: Home / Self Care | Source: Ambulatory Visit | Attending: Rheumatology | Admitting: Rheumatology

## 2024-07-02 DIAGNOSIS — R0602 Shortness of breath: Secondary | ICD-10-CM | POA: Insufficient documentation

## 2024-07-02 DIAGNOSIS — R7989 Other specified abnormal findings of blood chemistry: Secondary | ICD-10-CM | POA: Diagnosis present

## 2024-07-02 LAB — D-DIMER, QUANTITATIVE: D-Dimer, Quant: 1.24 ug{FEU}/mL — ABNORMAL HIGH (ref 0.00–0.50)

## 2024-07-03 NOTE — Progress Notes (Signed)
 Rescheduled

## 2024-08-08 ENCOUNTER — Encounter: Payer: Self-pay | Admitting: Family Medicine

## 2024-08-13 ENCOUNTER — Ambulatory Visit: Admitting: Family Medicine

## 2024-08-13 ENCOUNTER — Ambulatory Visit (INDEPENDENT_AMBULATORY_CARE_PROVIDER_SITE_OTHER): Admitting: Nurse Practitioner

## 2024-08-13 ENCOUNTER — Encounter: Payer: Self-pay | Admitting: Nurse Practitioner

## 2024-08-13 VITALS — BP 112/68 | HR 97 | Temp 97.8°F | Resp 18 | Ht 66.0 in | Wt 132.8 lb

## 2024-08-13 DIAGNOSIS — N6312 Unspecified lump in the right breast, upper inner quadrant: Secondary | ICD-10-CM | POA: Diagnosis not present

## 2024-08-13 NOTE — ED Triage Notes (Signed)
 Pt c/o mid sternal chest pain with tightness when breathing since this afternoon.

## 2024-08-13 NOTE — Progress Notes (Signed)
 BP 112/68   Pulse 97   Temp 97.8 F (36.6 C)   Resp 18   Ht 5' 6 (1.676 m)   Wt 132 lb 12.8 oz (60.2 kg)   SpO2 99%   BMI 21.43 kg/m    Subjective:    Patient ID: Jennifer Byrd, female    DOB: 29-Dec-1995, 29 y.o.   MRN: [id number]  HPI: Jennifer Byrd is a 28 y.o. female  Chief Complaint  Patient presents with   Breast Mass    RIGHT    Discussed the use of AI scribe software for clinical note transcription with the patient, who gave verbal consent to proceed.  History of Present Illness Jennifer Byrd is a 28 year old female who presents with a right breast mass.  Right breast mass - Palpable mass in the right breast first noticed last year - Recent increase in size over the past two weeks - Mass is located on the right side - No associated pain - No skin changes or nipple discharge - Concern regarding the recent enlargement of the mass  Breast imaging findings - Previous breast ultrasound demonstrated fibro glandular tissue without concerning findings in 2024  Left breast findings - No masses, pain, or other abnormalities in the left breast         07/01/2024    1:15 PM 05/04/2024   11:35 Jennifer 04/20/2024   11:30 Jennifer  Depression screen PHQ 2/9  Decreased Interest 0 0 0  Down, Depressed, Hopeless 0 0 0  PHQ - 2 Score 0 0 0  Altered sleeping 0 0 0  Tired, decreased energy 0 0 0  Change in appetite 0 0 0  Feeling bad or failure about yourself  0 0 0  Trouble concentrating 0 0 0  Moving slowly or fidgety/restless 0 0 0  Suicidal thoughts 0 0 0  PHQ-9 Score 0 0 0  Difficult doing work/chores Not difficult at all Not difficult at all Not difficult at all    Relevant past medical, surgical, family and social history reviewed and updated as indicated. Interim medical history since our last visit reviewed. Allergies and medications reviewed and updated.  Review of Systems  Ten systems reviewed and is negative except as mentioned in HPI       Objective:     BP 112/68   Pulse 97   Temp 97.8 F (36.6 C)   Resp 18   Ht 5' 6 (1.676 m)   Wt 132 lb 12.8 oz (60.2 kg)   SpO2 99%   BMI 21.43 kg/m    Wt Readings from Last 3 Encounters:  08/13/24 132 lb 12.8 oz (60.2 kg)  07/01/24 133 lb 4.8 oz (60.5 kg)  06/29/24 130 lb (59 kg)    Physical Exam Physical Exam GENERAL: Alert, cooperative, well developed, no acute distress. HEENT: Normocephalic, normal oropharynx, moist mucous membranes. CHEST: Clear to auscultation bilaterally, no wheezes, rhonchi, or crackles. CARDIOVASCULAR: Normal heart rate and rhythm, S1 and S2 normal without murmurs. BREAST: Palpable mass in right breast.12 o'clock ABDOMEN: Soft, non-tender, non-distended, without organomegaly, normal bowel sounds. EXTREMITIES: No cyanosis or edema. NEUROLOGICAL: Cranial nerves grossly intact, moves all extremities without gross motor or sensory deficit.           Assessment & Plan:   Problem List Items Addressed This Visit   None Visit Diagnoses       Mass of upper inner quadrant of right breast    -  Primary  Relevant Orders   MM 3D DIAGNOSTIC MAMMOGRAM UNILATERAL RIGHT BREAST   US  LIMITED ULTRASOUND INCLUDING AXILLA RIGHT BREAST        Assessment and Plan Assessment & Plan Right breast mass Reports a right breast mass noticed last year, with recent increase in size over the past two weeks. No associated pain, skin changes, or nipple discharge. Previous ultrasound showed fibro glandular tissue with no concerning findings. Differential diagnosis includes benign fatty tissue, which can fluctuate in size. Most such masses are benign and require monitoring. - Order diagnostic mammogram and breast ultrasound. - Advise her to schedule the imaging studies.        Follow up plan: Return if symptoms worsen or fail to improve.

## 2024-08-15 ENCOUNTER — Other Ambulatory Visit: Payer: Self-pay

## 2024-08-15 ENCOUNTER — Emergency Department
Admission: EM | Admit: 2024-08-15 | Discharge: 2024-08-15 | Disposition: A | Discharge status: Home / Self Care | Attending: Emergency Medicine | Admitting: Emergency Medicine

## 2024-08-15 ENCOUNTER — Emergency Department

## 2024-08-15 DIAGNOSIS — R0789 Other chest pain: Secondary | ICD-10-CM | POA: Diagnosis present

## 2024-08-15 DIAGNOSIS — M79602 Pain in left arm: Secondary | ICD-10-CM | POA: Diagnosis not present

## 2024-08-15 DIAGNOSIS — R079 Chest pain, unspecified: Secondary | ICD-10-CM

## 2024-08-15 LAB — BASIC METABOLIC PANEL WITH GFR
Anion gap: 7 (ref 5–15)
BUN: 14 mg/dL (ref 6–20)
CO2: 27 mmol/L (ref 22–32)
Calcium: 9.1 mg/dL (ref 8.9–10.3)
Chloride: 103 mmol/L (ref 98–111)
Creatinine, Ser: 0.91 mg/dL (ref 0.44–1.00)
GFR, Estimated: >60 mL/min (ref >60)
Glucose, Bld: 99 mg/dL (ref 70–99)
Potassium: 3.9 mmol/L (ref 3.5–5.1)
Sodium: 137 mmol/L (ref 135–145)

## 2024-08-15 LAB — CBC
HCT: 38.6 % (ref 36.0–46.0)
Hemoglobin: 12.9 g/dL (ref 12.0–15.0)
MCH: 25.8 pg — ABNORMAL LOW (ref 26.0–34.0)
MCHC: 33.4 g/dL (ref 30.0–36.0)
MCV: 77.2 fL — ABNORMAL LOW (ref 80.0–100.0)
Platelets: 200 K/uL (ref 150–400)
RBC: 5 MIL/uL (ref 3.87–5.11)
RDW: 15.2 % (ref 11.5–15.5)
WBC: 5.6 K/uL (ref 4.0–10.5)
nRBC: 0 % (ref 0.0–0.2)

## 2024-08-15 LAB — TROPONIN I (HIGH SENSITIVITY): Troponin I (High Sensitivity): <2 ng/L (ref <18)

## 2024-08-15 MED ORDER — IOHEXOL 350 MG/ML SOLN
75.0000 mL | Freq: Once | INTRAVENOUS | Status: AC | PRN
  Administered 2024-08-15: 75 mL via INTRAVENOUS

## 2024-08-15 NOTE — Discharge Instructions (Signed)
 You were seen in the ER today for evaluation of your chest and arm pain.  We fortunately did not see an emergency cause for this.  Follow-up with your primary care doctor for further evaluation.  Return to the ER for new or worsening symptoms.

## 2024-08-15 NOTE — ED Provider Notes (Signed)
 [facility] Provider Note    Event Date/Time   First MD Initiated Contact with Patient 08/15/24 [phone]     (approximate)   History   Chest Pain and Arm Pain   HPI  Jennifer Byrd is a 28 year old female presenting to the emergency department for evaluation of chest pain and arm pain.  Patient reports that last night she had onset of chest pain described as a tightness in the center of her chest.  Today, she noticed an achy feeling over her left arm distal to her elbow.  In the setting of this, presents for further evaluation.  Currently reports improved symptoms.  Of note, patient was seen in our ER on 7/8 for chest pain.  At that time, did have an elevated D-dimer at 1.25.  CTA was recommended, but patient ultimately declined to proceed.  She was also seen at outside ER on 7/12 where she continued to have an elevated D-dimer but declined at that time as well.  Has followed up with rheumatology, but has not had PE study.    Physical Exam   Triage Vital Signs: ED Triage Vitals  Encounter Vitals Group     BP 08/15/24 0317 (!) 138/92     Girls Systolic BP Percentile --      Girls Diastolic BP Percentile --      Boys Systolic BP Percentile --      Boys Diastolic BP Percentile --      Pulse Rate 08/15/24 0317 75     Resp 08/15/24 0317 18     Temp 08/15/24 0317 97.8 F (36.6 C)     Temp src --      SpO2 08/15/24 0317 99 %     Weight 08/15/24 0312 132 lb (59.9 kg)     Height 08/15/24 0312 5' 6 (1.676 m)     Head Circumference --      Peak Flow --      Pain Score 08/15/24 0311 7     Pain Loc --      Pain Education --      Exclude from Growth Chart --     Most recent vital signs: Vitals:   08/15/24 0317 08/15/24 0430  BP: (!) 138/92 119/86  Pulse: 75 68  Resp: 18 10  Temp: 97.8 F (36.6 C)   SpO2: 99% 100%     General: Awake, interactive  CV:  Regular rate, good peripheral perfusion.  Resp:  Unlabored respirations, lungs clear to  auscultation  Chest wall: Not significantly tender to palpation Abd:  Nondistended.  Neuro:  Symmetric facial movement, fluid speech MSK:  2+ radial pulse, intact sensation and motor over the left upper extremity   ED Results / Procedures / Treatments   Labs (all labs ordered are listed, but only abnormal results are displayed) Labs Reviewed  CBC - Abnormal; Notable for the following components:      Result Value   MCV 77.2 (*)    MCH 25.8 (*)    All other components within normal limits  BASIC METABOLIC PANEL WITH GFR  POC URINE PREG, ED  TROPONIN I (HIGH SENSITIVITY)     EKG EKG independently reviewed and interpreted by myself demonstrates:  EKG demonstrates normal sinus rhythm rate of 74, PR 132, cures 92, QTc 432, no acute ST changes  RADIOLOGY Imaging independently reviewed and interpreted by myself demonstrates:  CXR without focal consolidation CTA of the chest without PE, radiologist does note trace pericardial effusion  Formal Radiology Read:  CT Angio Chest PE W and/or Wo Contrast Result Date: 08/15/2024 CLINICAL DATA:  Chest and left arm pain worsening since yesterday. Clinical concern for pulmonary embolus. EXAM: CT ANGIOGRAPHY CHEST WITH CONTRAST TECHNIQUE: Multidetector CT imaging of the chest was performed using the standard protocol during bolus administration of intravenous contrast. Multiplanar CT image reconstructions and MIPs were obtained to evaluate the vascular anatomy. RADIATION DOSE REDUCTION: This exam was performed according to the departmental dose-optimization program which includes automated exposure control, adjustment of the mA and/or kV according to patient size and/or use of iterative reconstruction technique. CONTRAST:  75mL OMNIPAQUE  IOHEXOL  350 MG/ML SOLN COMPARISON:  None Available. FINDINGS: Cardiovascular: The heart size is normal. Trace inferior pericardial effusion (axial image 308/series 6 and sagittal image 92/8). No thoracic aortic  aneurysm. No substantial atherosclerotic calcification of the thoracic aorta. There is no filling defect within the opacified pulmonary arteries to suggest the presence of an acute pulmonary embolus. Mediastinum/Nodes: No mediastinal lymphadenopathy. There is no hilar lymphadenopathy. The esophagus has normal imaging features. There is no axillary lymphadenopathy. Lungs/Pleura: The lungs are clear without focal pneumonia, edema, pneumothorax or pleural effusion. Upper Abdomen: Visualized portion of the upper abdomen shows no acute findings. Musculoskeletal: No worrisome lytic or sclerotic osseous abnormality. Review of the MIP images confirms the above findings. IMPRESSION: 1. No CT evidence for acute pulmonary embolus. 2. Trace inferior pericardial effusion. Electronically Signed   By: Camellia Candle M.D.   On: 08/15/2024 05:55   DG Chest 1 View Result Date: 08/15/2024 CLINICAL DATA:  [id number]. Chest pain and left upper extremity pain worsening since yesterday. EXAM: CHEST  1 VIEW COMPARISON:  Portable chest 06/29/2024 FINDINGS: The heart size and mediastinal contours are within normal limits. Both lungs are clear. The visualized skeletal structures are unremarkable. IMPRESSION: No active disease.  Stable chest. Electronically Signed   By: Francis Quam M.D.   On: 08/15/2024 03:39    PROCEDURES:  Critical Care performed: No  Procedures   MEDICATIONS ORDERED IN ED: Medications  iohexol  (OMNIPAQUE ) 350 MG/ML injection 75 mL (75 mLs Intravenous Contrast Given 08/15/24 0506)     IMPRESSION / MDM / ASSESSMENT AND PLAN / ED COURSE  I reviewed the triage vital signs and the nursing notes.  Differential diagnosis includes, but is not limited to, ACS, pneumonia, PE, pneumothorax, musculoskeletal strain, stress mediated physiologic response, peripheral nerve pathology  Patient's presentation is most consistent with acute presentation with potential threat to life or bodily function.  28 year old female  presenting to the emergency department for evaluation of chest pain.  Stable vitals on presentation.  Labs without significant derangement including CBC, BMP, negative troponin with well over 3 hours of symptoms.  X-Twylia Oka without focal consolidation.  Patient has had multiple recent ER visits with elevated D-dimer, in the setting of this did have shared decision making conversation and patient would like to proceed with CTA of the chest.  CTA fortunately without evidence of PE.  Did note a trace pericardial effusion, EKG and clinical history not suggestive of pericarditis in the absence of shortness of breath or pleuritic chest pain.  Discussed results of workup with patient.  She is comfortable with discharge and continued outpatient follow-up.  Strict return precautions provided.  Patient discharged stable condition.      FINAL CLINICAL IMPRESSION(S) / ED DIAGNOSES   Final diagnoses:  Nonspecific chest pain  Pain of left upper extremity     Rx / DC Orders   ED Discharge Orders  None        Note:  This document was prepared using Dragon voice recognition software and may include unintentional dictation errors.   Levander Slate, MD 08/15/24 [phone]

## 2024-08-15 NOTE — ED Triage Notes (Signed)
 Patient ambulatory to triage with complaints of chest pain and left arm pain which is worsening since yesterday. Patient denies medical hx, no medications regularly. Denies other accompanying symptoms.

## 2024-08-18 ENCOUNTER — Ambulatory Visit
Admission: RE | Admit: 2024-08-18 | Discharge: 2024-08-18 | Disposition: A | Discharge status: Home / Self Care | Source: Ambulatory Visit | Attending: Nurse Practitioner | Admitting: Nurse Practitioner

## 2024-08-18 DIAGNOSIS — N6312 Unspecified lump in the right breast, upper inner quadrant: Secondary | ICD-10-CM | POA: Diagnosis present

## 2024-09-15 ENCOUNTER — Ambulatory Visit: Admitting: Internal Medicine

## 2024-09-15 ENCOUNTER — Encounter: Payer: Self-pay | Admitting: Family Medicine

## 2024-09-15 ENCOUNTER — Ambulatory Visit: Payer: Self-pay

## 2024-09-15 ENCOUNTER — Ambulatory Visit: Payer: Self-pay | Admitting: Family Medicine

## 2024-09-15 ENCOUNTER — Ambulatory Visit (INDEPENDENT_AMBULATORY_CARE_PROVIDER_SITE_OTHER): Admitting: Family Medicine

## 2024-09-15 ENCOUNTER — Ambulatory Visit
Admission: RE | Admit: 2024-09-15 | Discharge: 2024-09-15 | Disposition: A | Discharge status: Home / Self Care | Source: Ambulatory Visit | Attending: Family Medicine | Admitting: Family Medicine

## 2024-09-15 VITALS — BP 110/70 | HR 69 | Ht 66.0 in | Wt 134.2 lb

## 2024-09-15 DIAGNOSIS — R109 Unspecified abdominal pain: Secondary | ICD-10-CM | POA: Diagnosis not present

## 2024-09-15 DIAGNOSIS — K581 Irritable bowel syndrome with constipation: Secondary | ICD-10-CM

## 2024-09-15 DIAGNOSIS — R102 Pelvic and perineal pain: Secondary | ICD-10-CM

## 2024-09-15 LAB — POCT URINALYSIS DIPSTICK
Bilirubin, UA: NEGATIVE
Blood, UA: NEGATIVE
Glucose, UA: NEGATIVE
Ketones, UA: NEGATIVE
Leukocytes, UA: NEGATIVE
Nitrite, UA: NEGATIVE
Odor: POSITIVE
Protein, UA: NEGATIVE
Spec Grav, UA: 1.01 (ref 1.010–1.025)
Urobilinogen, UA: 0.2 U/dL
pH, UA: 7.5 (ref 5.0–8.0)

## 2024-09-15 LAB — CBC WITH DIFFERENTIAL/PLATELET
Absolute Lymphocytes: 1871 {cells}/uL (ref 850–3900)
Absolute Monocytes: 423 {cells}/uL (ref 200–950)
Basophils Absolute: 28 {cells}/uL (ref 0–200)
Basophils Relative: 0.6 %
Eosinophils Absolute: 71 {cells}/uL (ref 15–500)
Eosinophils Relative: 1.5 %
HCT: 41.1 % (ref 35.0–45.0)
Hemoglobin: 13.2 g/dL (ref 11.7–15.5)
MCH: 26 pg — ABNORMAL LOW (ref 27.0–33.0)
MCHC: 32.1 g/dL (ref 32.0–36.0)
MCV: 80.9 fL (ref 80.0–100.0)
MPV: 11.6 fL (ref 7.5–12.5)
Monocytes Relative: 9 %
Neutro Abs: 2308 {cells}/uL (ref 1500–7800)
Neutrophils Relative %: 49.1 %
Platelets: 232 Thousand/uL (ref 140–400)
RBC: 5.08 Million/uL (ref 3.80–5.10)
RDW: 14.8 % (ref 11.0–15.0)
Total Lymphocyte: 39.8 %
WBC: 4.7 Thousand/uL (ref 3.8–10.8)

## 2024-09-15 LAB — COMPREHENSIVE METABOLIC PANEL WITH GFR
AG Ratio: 1.6 (calc) (ref 1.0–2.5)
ALT: 14 U/L (ref 6–29)
AST: 15 U/L (ref 10–30)
Albumin: 4.4 g/dL (ref 3.6–5.1)
Alkaline phosphatase (APISO): 49 U/L (ref 31–125)
BUN: 9 mg/dL (ref 7–25)
CO2: 28 mmol/L (ref 20–32)
Calcium: 9.3 mg/dL (ref 8.6–10.2)
Chloride: 102 mmol/L (ref 98–110)
Creat: 0.89 mg/dL (ref 0.50–0.96)
Globulin: 2.7 g/dL (ref 1.9–3.7)
Glucose, Bld: 74 mg/dL (ref 65–99)
Potassium: 4.1 mmol/L (ref 3.5–5.3)
Sodium: 136 mmol/L (ref 135–146)
Total Bilirubin: 0.3 mg/dL (ref 0.2–1.2)
Total Protein: 7.1 g/dL (ref 6.1–8.1)
eGFR: 90 mL/min/1.73m2 (ref >=60)
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
## 2024-09-15 NOTE — Patient Instructions (Addendum)
 Thank you for coming to the office today.  Today testing KUB X-ray of abdomen, stay tuned for results on MyChart It can detect constipation and bowel issues  Urine to rule out infection or blood or issues. Results on mychart today  Blood work, 24 hours, or this evening - If elevated WBC or other markers, we can explain and share suggestions or concerns - If normal, this helps us  rule out some conditions  For Constipation (less frequent bowel movement that can be hard dry or involve straining).  Recommend trying OTC Miralax 17g = 1 capful in large glass water once daily for now, try several days to see if working, goal is soft stool or BM 1-2 times daily, if too loose then reduce dose or try every other day. If not effective may need to increase it to 2 doses at once in AM or may do 1 in morning and 1 in afternoon/evening  - This medicine is very safe and can be used often without any problem and will not make you dehydrated. It is good for use on AS NEEDED BASIS or even MAINTENANCE therapy for longer term for several days to weeks at a time to help regulate bowel movements  Other more natural remedies or preventative treatment: - Increase hydration with water - Increase fiber in diet (high fiber foods = vegetables, leafy greens, oats/grains) - May take OTC Fiber supplement (metamucil powder or pill/gummy) - May try OTC Probiotic  IBGard OTC Peppermint Oil (Triple Coated Capsule) 180mg  take one 3 times daily to reduce diarrhea ---------------------------  Please schedule a Follow-up Appointment to: Return if symptoms worsen or fail to improve.  If you have any other questions or concerns, please feel free to call the office or send a message through MyChart. You may also schedule an earlier appointment if necessary.  Additionally, you may be receiving a survey about your experience at our office within a few days to 1 week by e-mail or mail. We value your feedback.  Marsa Officer, DO [facility], [place]

## 2024-09-15 NOTE — Progress Notes (Signed)
 Subjective:    Patient ID: Jennifer Byrd, female    DOB: [birth year], 28 y.o.   MRN: [id number]  Jennifer Byrd is a 28 y.o. female presenting on 09/15/2024 for Abdominal Pain (Bloating and pain)  Patient presents for an ACUTE or SAME DAY appointment.  PCP Dr Dorette Loron at [facility]  Note - This patient's PCP works out of a different office within the [facility] region. They have been scheduled with our office for a one time ACUTE visit today due to limited availability for acute appointment with their PCP's office. Additional follow-up and management beyond the scope of the discussion and treatment for today's visit will be addressed by their PCP's office.   HPI  Discussed the use of AI scribe software for clinical note transcription with the patient, who gave verbal consent to proceed.  History of Present Illness   Jennifer Byrd is a 28 year old female with IBS who presents with lower abdominal pain.  Abdominal pain R sided Constipation, IBS history  - Onset yesterday evening around 6 PM - Pain localized primarily to the right lower abdomen, occasionally affecting the left side - Pain is constant and worsened by movement (walking, bending, twisting) - Pain relieved by remaining still - Disrupted sleep due to pain - No associated nausea, vomiting, or fever - No prior abdominal surgeries; retains gallbladder and appendix  Abdominal bloating and gastrointestinal symptoms - Bloating present since onset of pain - Bloating persisted despite fasting overnight - Avoided eating dinner due to concern for symptom exacerbation - Ate a cup of rice today without worsening pain - eating and drinking does not make pain worse - No pain with bowel movements yesterday and today, but stools were harder and possibly incomplete, similar to constipation episode - No urinary symptoms (no dysuria, frequency, or hematuria)  Irritable  bowel syndrome (ibs-c) - History of IBS with constipation predominance (IBS-C) - Management with Benefiber, but recent inconsistent use - Previously tried Metamucil and Miralax - Similar episode of abdominal pain approximately 18 months ago, resolved within a day and was not lateralized  Gynecologic history She saw GYN today at [facility] already for evaluation and they suggested possible ovarian cyst. 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 - Recently increased physical activity during training for Jennifer serving job, including carrying heavy items such as a bucket of ice - Speculates recent physical exertion may have contributed to current symptoms      Past Surgical History:  Procedure Laterality Date   wisdon teeth           07/01/2024    1:15 PM 05/04/2024   11:35 AM 04/20/2024   11:30 AM  Depression screen PHQ 2/9  Decreased Interest 0 0 0  Down, Depressed, Hopeless 0 0 0  PHQ - 2 Score 0 0 0  Altered sleeping 0 0 0  Tired, decreased energy 0 0 0  Change in appetite 0 0 0  Feeling bad or failure about yourself  0 0 0  Trouble concentrating 0 0 0  Moving slowly or fidgety/restless 0 0 0  Suicidal thoughts 0 0 0  PHQ-9 Score 0 0 0  Difficult doing work/chores Not difficult at all Not difficult at all Not difficult at all       07/01/2024    1:15 PM 05/04/2024  11:36 AM 04/20/2024   11:30 AM 10/29/2023    1:28 PM  GAD 7 : Generalized Anxiety Score  Nervous, Anxious, on Edge 1 3 3 3   Control/stop worrying 1 3 3 3   Worry too much - different things 1 3 3 3   Trouble relaxing 1 3 3 3   Restless 1 3 3 2   Easily annoyed or irritable 1 3 3 2   Afraid - awful might happen 1 3 3 3   Total GAD 7 Score 7 21 21 19   Anxiety Difficulty Somewhat difficult Very difficult Very difficult Very difficult    Social History   Tobacco Use   Smoking status: Never   Smokeless  tobacco: Never  Vaping Use   Vaping status: Never Used  Substance Use Topics   Alcohol use: Yes    Alcohol/week: 1.0 standard drink of alcohol    Types: 1 Shots of liquor per week    Comment: occ   Drug use: Yes    Types: Marijuana    Review of Systems Per HPI unless specifically indicated above     Objective:    BP 110/70 (BP Location: Right Arm, Patient Position: Sitting, Cuff Size: Normal)   Pulse 69   Ht 5' 6 (1.676 m)   Wt 134 lb 4 oz (60.9 kg)   SpO2 96%   BMI 21.67 kg/m   Wt Readings from Last 3 Encounters:  09/15/24 134 lb 4 oz (60.9 kg)  08/15/24 132 lb (59.9 kg)  08/13/24 132 lb 12.8 oz (60.2 kg)    Physical Exam Vitals and nursing note reviewed.  Constitutional:      General: She is not in acute distress.    Appearance: Normal appearance. She is well-developed. She is not diaphoretic.     Comments: Well-appearing, comfortable, cooperative  HENT:     Head: Normocephalic and atraumatic.  Eyes:     General:        Right eye: No discharge.        Left eye: No discharge.     Conjunctiva/sclera: Conjunctivae normal.  Neck:     Thyroid : No thyromegaly.  Cardiovascular:     Rate and Rhythm: Normal rate and regular rhythm.     Heart sounds: Normal heart sounds. No murmur heard. Pulmonary:     Effort: Pulmonary effort is normal. No respiratory distress.     Breath sounds: Normal breath sounds. No wheezing or rales.  Abdominal:     General: There is no distension.     Palpations: Abdomen is soft. There is no mass.     Tenderness: There is no abdominal tenderness. There is no guarding or rebound.     Comments: Reduced bowel sounds.  Some transient sharp pain across abdomen palpation on L to R and vice versa. But this is not a severe pain. She has negative McBurney's sign RLQ and Negative RUQ Murphy's sign on inspiration.  Musculoskeletal:        General: Normal range of motion.     Cervical back: Normal range of motion and neck supple.  Lymphadenopathy:      Cervical: No cervical adenopathy.  Skin:    General: Skin is warm and dry.     Findings: No erythema or rash.  Neurological:     Mental Status: She is alert and oriented to person, place, and time.  Psychiatric:        Mood and Affect: Mood normal.        Behavior: Behavior normal.  Thought Content: Thought content normal.     Comments: Well groomed, good eye contact, normal speech and thoughts     I have personally reviewed the radiology report from [facility] X-ray on 09/15/24.  EXAM: 1 VIEW XRAY OF THE ABDOMEN 09/15/2024 04:27:28 PM   COMPARISON: CT abdomen and pelvis 07/06/2019.   CLINICAL HISTORY: R sided lower and upper abdominal pain some L sided, constipation, IBS-C. Right sided lower abdominal pain since yesterday, IBS, constipation, last BM normal but constipated stool, Denies nausea or vomiting, no surgery.   FINDINGS:   BOWEL: Nonobstructive bowel gas pattern. Mild stool burden noted within the colon and rectum.   SOFT TISSUES: IUD noted within the pelvis. No opaque urinary calculi.   BONES: No acute osseous abnormality.   IMPRESSION: 1. No acute abdominal abnormality. 2. Mild stool burden within the colon and rectum.   Electronically signed by: Waddell Calk MD 09/15/2024 04:50 PM EDT RP Workstation: [id number]  Results for orders placed or performed in visit on 09/15/24  POCT Urinalysis Dipstick   Collection Time: 09/15/24  4:00 PM  Result Value Ref Range   Color, UA yellow    Clarity, UA cleat    Glucose, UA Negative Negative   Bilirubin, UA negative    Ketones, UA negative    Spec Grav, UA 1.010 1.010 - 1.025   Blood, UA negative    pH, UA 7.5 5.0 - 8.0   Protein, UA Negative Negative   Urobilinogen, UA 0.2 0.2 or 1.0 E.U./dL   Nitrite, UA negative    Leukocytes, UA Negative Negative   Appearance     Odor positive       Assessment & Plan:   Problem List Items Addressed This Visit     Irritable bowel syndrome   Other Visit  Diagnoses       Right sided abdominal pain    -  Primary   Relevant Orders   DG Abd 1 View (Completed)   CBC with Differential/Platelet   Comprehensive metabolic panel with GFR   Urine Culture   POCT Urinalysis Dipstick (Completed)     Pelvic pain       Relevant Orders   Urine Culture   POCT Urinalysis Dipstick (Completed)        Abdominal pain with constipation and bloating Acute lower abdominal pain with bloating and constipation.   Likely acute on Chronic IBS with constipation, currently experiencing a flare-up. Symptoms include bloating and incomplete bowel movements. Benefiber used inconsistently.  Differential includes ovarian cyst, muscle strain, or appendicitis.   Note she saw OBGYN today already Jennifer Byrd ) evaluation suggests possible ovulation or ovarian cyst.  Heavy lifting episode prior to onset pain could possibly have contributed to MSK etiology as well given pain provoked on movement.  No signs of acute appendicitis based on history or physical exam. Abdomen is BENIGN today on my exam.  - Order [facility] abdominal x-ray to assess for constipation and bowel issues. - see results above, sent to mychart to patient. Mild constipation stool burden identified. - Perform urine dipstick to rule out infection or blood. Negative result, see above, no further urinary testing.  - Order blood work to check white blood cell count and rule out infection. Pending CMET + CBC  - Advise use of Miralax for constipation, starting with one cap and increasing as needed, up to four caps. - Suggest peppermint oil capsules (IB Guard) for IBS symptom relief.  - Discuss long-term management strategies including diet, hydration,  and fiber intake.   - Discuss potential for further imaging, such as ultrasound or CT if symptoms not responding or if X-ray inconclusive    Return criteria given if severe sudden worsening abdominal pain and Jennifer symptoms nausea vomiting loss appetite or  other acute concerns. When to seek care at [facility]  Route to Dr Glenard PCP  Orders Placed This Encounter  Procedures   Urine Culture   DG Abd 1 View    Standing Status:   Future    Number of Occurrences:   1    Expiration Date:   11/15/2025    Reason for Exam (SYMPTOM  OR DIAGNOSIS REQUIRED):   R sided lower and upper abdominal pain some L sided, constipation, IBS-C    Is the patient pregnant?:   No             IUD    Preferred imaging location?:   [facility]   CBC with Differential/Platelet   Comprehensive metabolic panel with GFR   POCT Urinalysis Dipstick    No orders of the defined types were placed in this encounter.   Follow up plan: Return if symptoms worsen or fail to improve.   Marsa Officer, DO [facility] 09/15/2024, 3:41 PM

## 2024-09-15 NOTE — Telephone Encounter (Signed)
 Patient states she arrived at her appointment- 1:54 and was tuned away. Patient states she takes responsibility for arriving late.  Call to office to see if patient can be moved to another open slot- per CAL- provider has declined to see patient and so she can not be scheduled.  Called patient PCP office to see if they can possibly see her today and they do not have opening- Urgent care advised. Patient is not happy and has asked for number to call complaint- Patient experience number provided.

## 2024-09-15 NOTE — Telephone Encounter (Signed)
 FYI Only or Action Required?: FYI only for provider.  Patient was last seen in primary care on 08/13/2024 by Gareth Mliss FALCON, FNP.  Called Nurse Triage reporting Abdominal Pain.  Symptoms began yesterday.  Interventions attempted: Nothing.  Symptoms are: gradually worsening.  Triage Disposition: See HCP Within 4 Hours (Or PCP Triage)  Patient/caregiver understands and will follow disposition?: Yes                             Copied from CRM #[id number]. Topic: Clinical - Red Word Triage >> Sep 15, 2024 11:31 AM Turkey B wrote: [facility] that prompted transfer to Nurse Triage: Patient has stomach pain Reason for Disposition  [1] MILD-MODERATE pain AND [2] constant AND [3] present > 2 hours  Answer Assessment - Initial Assessment Questions 1. LOCATION: Where does it hurt?      RLQ 2. RADIATION: Does the pain shoot anywhere else? (e.g., chest, back)     Radiates up and to the left side of abdomin 3. ONSET: When did the pain begin? (e.g., minutes, hours or days ago)      Yesterday  4. SUDDEN: Gradual or sudden onset?     Sudden  5. PATTERN Does the pain come and go, or is it constant?     Constant  6. SEVERITY: How bad is the pain?  (e.g., Scale 1-10; mild, moderate, or severe)     Rates pain a 5, describes pain as achy, a few occasional sharp pains 7. RECURRENT SYMPTOM: Have you ever had this type of stomach pain before? If Yes, ask: When was the last time? and What happened that time?      Yes, states she does not remember pain being on both sides as it is now 8. CAUSE: What do you think is causing the stomach pain? (e.g., gallstones, recent abdominal surgery)     Unsure, maybe IBS or ovarian cyst 9. RELIEVING/AGGRAVATING FACTORS: What makes it better or worse? (e.g., antacids, bending or twisting motion, bowel movement)     Touching area and moving exacerbates pain 10. OTHER SYMPTOMS: Do you have any other symptoms? (e.g., back  pain, diarrhea, fever, urination pain, vomiting)     Bloated, constipation, denies fever, denies vomiting, denies urinary symptoms 11. PREGNANCY: Is there any chance you are pregnant? When was your last menstrual period?     OBGYN recommended ultrasound for ovarian cyst    No availability in PCP office today. Scheduled same day appointment at alternate office in region.  Protocols used: Abdominal Pain - Female-A-AH

## 2024-09-28 ENCOUNTER — Ambulatory Visit: Payer: Self-pay

## 2024-09-28 NOTE — Telephone Encounter (Signed)
 FYI Only or Action Required?: FYI only for provider.  Patient was last seen in primary care on 09/15/2024 by Edman Marsa PARAS, DO.  Called Nurse Triage reporting Back Pain.  Symptoms began several weeks ago.  Interventions attempted: Rest, hydration, or home remedies and Ice/heat application.  Symptoms are: unchanged.  Triage Disposition: See PCP When Office is Open (Within 3 Days)  Patient/caregiver understands and will follow disposition?: Yes  Copied from CRM #[id number]. Topic: Clinical - Red Word Triage >> Sep 28, 2024  9:06 AM Avram MATSU wrote: Red Word that prompted transfer to Nurse Triage: pain on her lower/mid back Reason for Disposition  [1] MODERATE back pain (e.g., interferes with normal activities) AND [2] present > 3 days  Answer Assessment - Initial Assessment Questions Additional info: Evaluated 09/15/24 for right sided abdominal and back pain. Her back pain persists in lower and mid back. She is having bm's but small in size. Voiding well. Requesting evaluation.    1. ONSET: When did the pain begin? (e.g., minutes, hours, days)     Several days ongoing 2. LOCATION: Where does it hurt? (upper, mid or lower back)     Lower and mid 3. SEVERITY: How bad is the pain?  (e.g., Scale 1-10; mild, moderate, or severe)     3/10 4. PATTERN: Is the pain constant? (e.g., yes, no; constant, intermittent)      intermittent 5. RADIATION: Does the pain shoot into your legs or somewhere else?     no 6. CAUSE:  What do you think is causing the back pain?      Small bms  7. BACK OVERUSE:  Any recent lifting of heavy objects, strenuous work or exercise?     no 8. MEDICINES: What have you taken so far for the pain? (e.g., nothing, acetaminophen , NSAIDS)     Hasn't tried, heat helped 9. NEUROLOGIC SYMPTOMS: Do you have any weakness, numbness, or problems with bowel/bladder control?     denies 10. OTHER SYMPTOMS: Do you have any other symptoms? (e.g., fever,  abdomen pain, burning with urination, blood in urine)       denies 11. PREGNANCY: Is there any chance you are pregnant? When was your last menstrual period?  Protocols used: Back Pain-A-AH

## 2024-09-29 ENCOUNTER — Encounter: Payer: Self-pay | Admitting: Oncology

## 2024-09-29 ENCOUNTER — Telehealth: Payer: Self-pay

## 2024-09-29 ENCOUNTER — Ambulatory Visit (INDEPENDENT_AMBULATORY_CARE_PROVIDER_SITE_OTHER): Admitting: Family Medicine

## 2024-09-29 ENCOUNTER — Encounter: Payer: Self-pay | Admitting: Family Medicine

## 2024-09-29 ENCOUNTER — Other Ambulatory Visit (HOSPITAL_COMMUNITY): Payer: Self-pay

## 2024-09-29 VITALS — BP 120/74 | HR 86 | Resp 16 | Ht 66.0 in | Wt 133.2 lb

## 2024-09-29 DIAGNOSIS — R4589 Other symptoms and signs involving emotional state: Secondary | ICD-10-CM

## 2024-09-29 DIAGNOSIS — M545 Low back pain, unspecified: Secondary | ICD-10-CM | POA: Diagnosis not present

## 2024-09-29 DIAGNOSIS — K581 Irritable bowel syndrome with constipation: Secondary | ICD-10-CM | POA: Diagnosis not present

## 2024-09-29 MED ORDER — LIDOCAINE 5 % EX PTCH: 1.0000 | MEDICATED_PATCH | CUTANEOUS | 0 refills | Status: DC

## 2024-09-29 MED ORDER — POLYETHYLENE GLYCOL 3350 17 GM/SCOOP PO POWD: 17.0000 g | Freq: Every day | ORAL | 0 refills | Status: DC

## 2024-09-29 NOTE — Telephone Encounter (Signed)
 Pharmacy Patient Advocate Encounter   Received notification from Onbase that prior authorization for Lidocaine 5% patches  is required/requested.   Insurance verification completed.   The patient is insured through [employer]   Per test claim: PA required; PA submitted to above mentioned insurance via Latent Key/confirmation #/EOC [id number] Status is pending
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
## 2024-09-30 ENCOUNTER — Other Ambulatory Visit (HOSPITAL_COMMUNITY): Payer: Self-pay

## 2024-09-30 NOTE — Telephone Encounter (Signed)
 Pharmacy Patient Advocate Encounter  Received notification from [employer] that Prior Authorization for Lidocaine 5% patches has been APPROVED from 09/29/24 to 09/29/25. Ran test claim, Copay is $4.00. This test claim was processed through [facility]- copay amounts may vary at other pharmacies due to pharmacy/plan contracts, or as the patient moves through the different stages of their insurance plan.   PA #/Case ID/Reference #: [id number]

## 2024-10-29 ENCOUNTER — Other Ambulatory Visit: Payer: Self-pay | Admitting: Family Medicine

## 2024-10-29 NOTE — Telephone Encounter (Signed)
 Last CPE 10/2023, has only been seen for acute issues this year

## 2024-10-29 NOTE — Telephone Encounter (Signed)
Lvm asking pt to return call to schedule appt for refills

## 2024-11-06 ENCOUNTER — Encounter: Payer: Self-pay | Admitting: Family Medicine

## 2024-11-07 ENCOUNTER — Other Ambulatory Visit: Payer: Self-pay

## 2024-11-07 DIAGNOSIS — Z5321 Procedure and treatment not carried out due to patient leaving prior to being seen by health care provider: Secondary | ICD-10-CM | POA: Diagnosis not present

## 2024-11-07 DIAGNOSIS — R110 Nausea: Secondary | ICD-10-CM | POA: Insufficient documentation

## 2024-11-07 NOTE — ED Triage Notes (Addendum)
 Pt was eating a pizza tonight and noted some strings from a cleaning rag were baked into her pizza approx 20 min PTA. Now endorsing nausea without vomiting.

## 2024-11-08 ENCOUNTER — Emergency Department
Admission: EM | Admit: 2024-11-08 | Discharge: 2024-11-08 | Discharge status: Against Medical Advice | Attending: Emergency Medicine | Admitting: Emergency Medicine

## 2024-11-09 ENCOUNTER — Other Ambulatory Visit: Payer: Self-pay

## 2024-11-09 MED ORDER — EPINEPHRINE 0.3 MG/0.3ML IJ SOAJ: 0.3000 mg | INTRAMUSCULAR | 1 refills | Status: AC | PRN

## 2024-11-09 NOTE — Telephone Encounter (Signed)
 Unsure about to fill in the dose since the one is active is from historical provider

## 2024-12-10 ENCOUNTER — Encounter: Payer: Self-pay | Admitting: Nurse Practitioner

## 2024-12-10 ENCOUNTER — Encounter: Payer: Self-pay | Admitting: *Deleted

## 2024-12-10 ENCOUNTER — Other Ambulatory Visit: Payer: Self-pay

## 2024-12-10 ENCOUNTER — Emergency Department
Admission: EM | Admit: 2024-12-10 | Discharge: 2024-12-10 | Discharge status: Against Medical Advice | Source: Home / Self Care

## 2024-12-10 ENCOUNTER — Telehealth: Admitting: Nurse Practitioner

## 2024-12-10 VITALS — BP 126/88 | Temp 97.9°F | Wt 135.0 lb

## 2024-12-10 DIAGNOSIS — B349 Viral infection, unspecified: Secondary | ICD-10-CM | POA: Diagnosis not present

## 2024-12-10 DIAGNOSIS — M791 Myalgia, unspecified site: Secondary | ICD-10-CM | POA: Insufficient documentation

## 2024-12-10 DIAGNOSIS — R509 Fever, unspecified: Secondary | ICD-10-CM | POA: Diagnosis not present

## 2024-12-10 DIAGNOSIS — J029 Acute pharyngitis, unspecified: Secondary | ICD-10-CM | POA: Insufficient documentation

## 2024-12-10 DIAGNOSIS — Z20828 Contact with and (suspected) exposure to other viral communicable diseases: Secondary | ICD-10-CM

## 2024-12-10 DIAGNOSIS — Z5321 Procedure and treatment not carried out due to patient leaving prior to being seen by health care provider: Secondary | ICD-10-CM | POA: Insufficient documentation

## 2024-12-10 LAB — RESP PANEL BY RT-PCR (RSV, FLU A&B, COVID)  RVPGX2
Influenza A by PCR: NEGATIVE
Influenza B by PCR: NEGATIVE
Resp Syncytial Virus by PCR: NEGATIVE
SARS Coronavirus 2 by RT PCR: NEGATIVE

## 2024-12-10 LAB — GROUP A STREP BY PCR: Group A Strep by PCR: NOT DETECTED

## 2024-12-10 MED ORDER — OSELTAMIVIR PHOSPHATE 75 MG PO CAPS: 75.0000 mg | ORAL_CAPSULE | Freq: Two times a day (BID) | ORAL | 0 refills | Status: DC

## 2024-12-10 NOTE — ED Triage Notes (Signed)
 Pt to triage via wheelchair.  Pt reports bodyaches and chills, fever, sore throat today.  Pt alert.
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
## 2024-12-10 NOTE — Assessment & Plan Note (Signed)
 Symptoms and exposure indicate a likely viral cause, possibly influenza. Unable to complete flu, COVID or strep testing due to virtual appointment. Tamiflu  was discussed and prescribed. Recommend OTC supportive care, tylenol , ibuprofen, cold/flu medication as needed. Advise hydration and rest. Provided a work note for absence until Monday. Return precautions given to patient.

## 2025-01-04 ENCOUNTER — Telehealth: Payer: Self-pay

## 2025-01-04 NOTE — Telephone Encounter (Unsigned)
 Copied from CRM [id number]. Topic: Clinical - Request for Lab/Test Order >> Jan 04, 2025 11:01 AM Jennifer Byrd wrote: Reason for CRM: Patient is requesting orders for an ultra sound of her right breast. States she and Dr Glenard have discussed the testing. Would like a call back discuss.  Patient can be reached at [phone]

## 2025-01-05 ENCOUNTER — Telehealth: Payer: Self-pay

## 2025-01-05 NOTE — Telephone Encounter (Signed)
 Copied from CRM [id number]. Topic: Clinical - Request for Lab/Test Order >> Jan 04, 2025 11:01 AM Willma SAUNDERS wrote: Reason for CRM: Patient is requesting orders for an ultra sound of her right breast. States she and Dr Glenard have discussed the testing. Would like a call back discuss.  Patient can be reached at [phone] >> Jan 04, 2025  5:25 PM Sophia H wrote: Patient called back to see if there was an answer on ultrasound orders, advised patient that per the note in chart from Dr. Glenard she will need an office visit. Declined soonest available with PCP stating that's too far out, patient is wanting to know if she can see any other provider for this, please advise. # [phone]

## 2025-01-05 NOTE — Telephone Encounter (Signed)
 Please schedule appt

## 2025-01-05 NOTE — Telephone Encounter (Signed)
 Appt sch'd for 01/06/25 with Brittany

## 2025-01-06 ENCOUNTER — Encounter: Payer: Self-pay | Admitting: Family Medicine

## 2025-01-06 ENCOUNTER — Ambulatory Visit: Admitting: Family Medicine

## 2025-01-06 VITALS — BP 118/70 | HR 80 | Resp 16 | Ht 66.0 in | Wt 134.0 lb

## 2025-01-06 DIAGNOSIS — N6310 Unspecified lump in the right breast, unspecified quadrant: Secondary | ICD-10-CM | POA: Diagnosis not present

## 2025-01-06 NOTE — Progress Notes (Signed)
 "  Acute Office Visit  Subjective:     Patient ID: Jennifer Byrd, female    DOB: 09-Sep-1996, 29 y.o.   MRN: [id number]  Chief Complaint  Patient presents with   Breast Mass    Right breast near center of chest. Noticed a bump back in October, feels like it has grown in size. No pain or nipple discharge    HPI Patient is in today for concerns of right breast mass. She is a new patient to me. She was seen in August of last year for the same concerns where ultrasound imaging completed found palpable abnormality to be a benign cyst. She voices she feels that the mass has grown in size since last evaluation. She denies associated breast pain or nipple discharge. She denies family hx of breast cancer.  No regular menstrual period. She has Mirena IUD inserted 2019. She is not sexually active.  She does voice since last night she has been having palpitations. She feels this could be related to her anxiety and being nervous regarding right breast mass. She denies chest pain or shortness of breath. Return precautions advised.   Review of Systems  Constitutional:  Negative for chills, fever and malaise/fatigue.  Respiratory:  Negative for shortness of breath.   Cardiovascular:  Positive for palpitations. Negative for chest pain.        Objective:    BP 118/70   Pulse 80   Resp 16   Ht 5' 6 (1.676 m)   Wt 134 lb (60.8 kg)   LMP  (LMP Unknown)   SpO2 97%   BMI 21.63 kg/m      Physical Exam Constitutional:      General: She is not in acute distress.    Appearance: Normal appearance.  Cardiovascular:     Rate and Rhythm: Normal rate and regular rhythm.  Pulmonary:     Effort: Pulmonary effort is normal.     Breath sounds: Normal breath sounds.  Chest:  Breasts:    Breasts are symmetrical.     Right: Mass present. No swelling, bleeding, inverted nipple, nipple discharge, skin change or tenderness.     Left: Normal. No inverted nipple, mass, nipple discharge, skin change  or tenderness.  Skin:    General: Skin is warm and dry.  Neurological:     General: No focal deficit present.     Mental Status: She is alert.  Psychiatric:        Mood and Affect: Mood normal.        Behavior: Behavior normal.        Assessment & Plan:   Assessment & Plan Mass of right breast, unspecified quadrant 29 year old female with concerns of mass of the right breast. She reports that this was examined last year but since last August she feels that the mass has grown in size.   Ultrasound 08/18/24 findings included benign cyst at 12 o'clock 6 cm from the nipple. Measures 9 x 3 x 9mm. Findings were noted to be benign without concerns of malignancy.    Exam today: Approximately 12 o'clock position there is a mobile, non tender to palpation mass of the right breast. Also at the 9 o'clock position there is a mobile, non tender to palpation mass of the right breast. No nipple discharge or other exudate.  Left breast without palpable abnormalities. Breasts are symmetrical.   -Discussed referral for right breast diagnostic mammo, declined at this time -Proceed with right breast ultrasound -Return precuations  advised including change in mass or new worrisome symptoms -Schedule 6 week follow up for annual physical with PCP Orders:   US  BREAST COMPLETE UNI RIGHT INC AXILLA; Future     Return in 6 weeks (on 02/17/2025) for Annual physical with PCP .  LAYMON LOISE CORE, FNP  "

## 2025-01-07 ENCOUNTER — Other Ambulatory Visit: Payer: Self-pay | Admitting: Family Medicine

## 2025-01-07 DIAGNOSIS — N6310 Unspecified lump in the right breast, unspecified quadrant: Secondary | ICD-10-CM

## 2025-01-08 NOTE — Telephone Encounter (Signed)
 Faxing to place requested

## 2025-01-08 NOTE — Telephone Encounter (Unsigned)
 Copied from CRM [id number]. Topic: Referral - Question >> Jan 08, 2025  9:29 AM Winona SAUNDERS wrote: Pt would like to have her imaging done at [facility]- [address] [place], [place] [address]- ph: [phone] Scheduling line [phone] Please informed the pt when she can schedule. Pt will call to cancel her current imaging appointments at [facility]

## 2025-01-13 ENCOUNTER — Other Ambulatory Visit

## 2025-01-13 ENCOUNTER — Encounter

## 2025-02-17 ENCOUNTER — Encounter: Admitting: Family Medicine
